# Patient Record
Sex: Male | Born: 1955
Health system: Southern US, Community
[De-identification: ages and names within clinical notes are randomized; demographics above are authoritative.]

## PROBLEM LIST (undated history)

## (undated) ENCOUNTER — Inpatient Hospital Stay: Admission: EM | Payer: Self-pay | Source: Home / Self Care

## (undated) DIAGNOSIS — R7303 Prediabetes: Secondary | ICD-10-CM

## (undated) DIAGNOSIS — F411 Generalized anxiety disorder: Secondary | ICD-10-CM

## (undated) DIAGNOSIS — K219 Gastro-esophageal reflux disease without esophagitis: Secondary | ICD-10-CM

## (undated) DIAGNOSIS — E785 Hyperlipidemia, unspecified: Secondary | ICD-10-CM

## (undated) DIAGNOSIS — M1990 Unspecified osteoarthritis, unspecified site: Secondary | ICD-10-CM

## (undated) DIAGNOSIS — K5792 Diverticulitis of intestine, part unspecified, without perforation or abscess without bleeding: Secondary | ICD-10-CM

## (undated) DIAGNOSIS — F419 Anxiety disorder, unspecified: Secondary | ICD-10-CM

## (undated) DIAGNOSIS — E119 Type 2 diabetes mellitus without complications: Secondary | ICD-10-CM

## (undated) DIAGNOSIS — E1140 Type 2 diabetes mellitus with diabetic neuropathy, unspecified: Secondary | ICD-10-CM

## (undated) DIAGNOSIS — I10 Essential (primary) hypertension: Secondary | ICD-10-CM

## (undated) DIAGNOSIS — D126 Benign neoplasm of colon, unspecified: Secondary | ICD-10-CM

## (undated) DIAGNOSIS — C8590 Non-Hodgkin lymphoma, unspecified, unspecified site: Secondary | ICD-10-CM

## (undated) DIAGNOSIS — G709 Myoneural disorder, unspecified: Secondary | ICD-10-CM

## (undated) DIAGNOSIS — IMO0002 Reserved for concepts with insufficient information to code with codable children: Secondary | ICD-10-CM

## (undated) HISTORY — DX: Prediabetes: R73.03

## (undated) HISTORY — DX: Essential (primary) hypertension: I10

## (undated) HISTORY — DX: Type 2 diabetes mellitus without complications: E11.9

## (undated) HISTORY — PX: COLON SURGERY: SHX602

## (undated) HISTORY — DX: Gastro-esophageal reflux disease without esophagitis: K21.9

## (undated) HISTORY — DX: Benign neoplasm of colon, unspecified: D12.6

## (undated) HISTORY — PX: NASAL SINUS SURGERY: SHX719

## (undated) HISTORY — PX: COSMETIC SURGERY: SHX468

## (undated) HISTORY — PX: OTHER SURGICAL HISTORY: SHX169

## (undated) HISTORY — DX: Type 2 diabetes mellitus with diabetic neuropathy, unspecified: E11.40

## (undated) HISTORY — DX: Generalized anxiety disorder: F41.1

## (undated) HISTORY — DX: Hyperlipidemia, unspecified: E78.5

## (undated) HISTORY — DX: Reserved for concepts with insufficient information to code with codable children: IMO0002

## (undated) HISTORY — DX: Non-Hodgkin lymphoma, unspecified, unspecified site: C85.90

---

## 1999-01-06 HISTORY — PX: ACHILLES TENDON REPAIR: SUR1153

## 2000-01-06 DIAGNOSIS — Z9889 Other specified postprocedural states: Secondary | ICD-10-CM | POA: Insufficient documentation

## 2010-01-05 HISTORY — PX: HEMORROIDECTOMY: SUR656

## 2010-01-05 HISTORY — PX: COLONOSCOPY: SHX174

## 2010-06-19 DIAGNOSIS — Z8719 Personal history of other diseases of the digestive system: Secondary | ICD-10-CM | POA: Insufficient documentation

## 2012-01-06 HISTORY — PX: ROTATOR CUFF REPAIR: SHX139

## 2012-01-06 HISTORY — PX: BICEPS TENDON REPAIR: SHX566

## 2013-07-21 ENCOUNTER — Telehealth: Payer: Self-pay | Admitting: Internal Medicine

## 2013-07-21 NOTE — Telephone Encounter (Signed)
I should be able to accomodate this as long as he does not have a very complicated medical history

## 2013-07-21 NOTE — Telephone Encounter (Signed)
Pt called to make a new patient appointment. He needs a biometric screening form completed for work and due to his travel schedule, he would like to have new pt and his biometeric form completed on same day.  Best number to call pt is [phone].

## 2013-07-24 NOTE — Telephone Encounter (Signed)
Pt is aware that bringing the form is okay

## 2013-08-02 ENCOUNTER — Ambulatory Visit: Payer: Self-pay | Admitting: Internal Medicine

## 2014-06-06 ENCOUNTER — Encounter: Payer: Self-pay | Admitting: Family Medicine

## 2014-06-06 ENCOUNTER — Ambulatory Visit (INDEPENDENT_AMBULATORY_CARE_PROVIDER_SITE_OTHER): Payer: BLUE CROSS/BLUE SHIELD | Admitting: Family Medicine

## 2014-06-06 ENCOUNTER — Ambulatory Visit (INDEPENDENT_AMBULATORY_CARE_PROVIDER_SITE_OTHER)
Admission: RE | Admit: 2014-06-06 | Discharge: 2014-06-06 | Disposition: A | Discharge status: Home / Self Care | Payer: BLUE CROSS/BLUE SHIELD | Source: Ambulatory Visit | Attending: Family Medicine | Admitting: Family Medicine

## 2014-06-06 VITALS — BP 110/60 | HR 69 | Temp 97.6°F | Ht 69.5 in | Wt 217.5 lb

## 2014-06-06 DIAGNOSIS — M1990 Unspecified osteoarthritis, unspecified site: Secondary | ICD-10-CM | POA: Diagnosis not present

## 2014-06-06 DIAGNOSIS — R7309 Other abnormal glucose: Secondary | ICD-10-CM | POA: Diagnosis not present

## 2014-06-06 DIAGNOSIS — R7303 Prediabetes: Secondary | ICD-10-CM

## 2014-06-06 DIAGNOSIS — K219 Gastro-esophageal reflux disease without esophagitis: Secondary | ICD-10-CM | POA: Diagnosis not present

## 2014-06-06 DIAGNOSIS — M1611 Unilateral primary osteoarthritis, right hip: Secondary | ICD-10-CM

## 2014-06-06 DIAGNOSIS — M25551 Pain in right hip: Secondary | ICD-10-CM

## 2014-06-06 NOTE — Progress Notes (Signed)
Pre visit review using our clinic review tool, if applicable. No additional management support is needed unless otherwise documented below in the visit note. 

## 2014-06-06 NOTE — Progress Notes (Signed)
Dr. Frederico Hamman T. Arias Weinert, MD, [facility] and Sports Medicine [place], [address] Phone: [phone] Fax: [phone]  06/06/2014  [name]: Charles [name], MRN: [id number], DOB: February 12, 1955, 59 y.o.  Primary Physician:  Dr. Lorelei Pont Chief Complaint: Hip Pain  Subjective:   Charles [name] is Charles [name] who presents with Charles following:  New [name], eval for hip pain:   Very pleasant [name] who presents as Charles new [name] with Charles history significant for Achilles tendon rupture in Charles 2001 that was managed conservatively for approximately 9 weeks in Charles cast, and then when Charles [name] was out of his cast he reruptured his Achilles tendon 2 days after.  Subsequently he required surgery and was compromised for approximately 9 months per his report.  Since then he has had an altered gait on Charles right, but he is able to play golf routinely.  For Charles last 6 or 7 years, he has been having some significant hip pain that very recently has been waking him up at night all of Charles time.  It also limits him in what he wants to do.  He is still able to play golf, but he is not able to rotate on his hip, and after he plays golf his very limited for 2 or 3 days.  He does take some Aleve and other anti-inflammatories and Tylenol to try to give him some pain relief, which helps Charles little bit for some short amount of time.  This also bothers him when he is getting in and out of his car and when he is trying to work.  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   2001, R achilles rupture - in cast for 9 weeks.  Still plays Charles lot of golf.   Played 18 holes of golf and woke up at 2 AM last night. Took Charles couple of ibuprofen.   Ongoing 6-7 years.   [name] Active Problem List   Diagnosis Date Noted  . Arthritis of right hip 06/07/2014  . GERD (gastroesophageal reflux disease)   . Borderline diabetes     Past Medical History    Diagnosis Date  . GERD (gastroesophageal reflux disease)   . Borderline diabetes     Past Surgical History  Procedure Laterality Date  . Achilles tendon repair  2001  . Rotator cuff repair Left 2014  . Biceps tendon repair Left 2014  . Hemorroidectomy  2012    History   Social History  . Marital Status: Married    Spouse Name: N/Charles  . Number of Children: N/Charles  . Years of Education: N/Charles   Occupational History  . insurance adj    Social History Main Topics  . Smoking status: Former Research scientist (life sciences)  . Smokeless tobacco: Former Systems developer  . Alcohol Use: No     Comment: occ  . Drug Use: No  . Sexual Activity:    Partners: Female   Other Topics Concern  . Not on file   Social History Narrative   Married, Arts administrator baseball player   Avid golfer    Family History  Problem Relation Age of Onset  . Alcohol abuse Father   . Heart attack Father   . Hyperlipidemia Father   . Stroke Father   . Hypertension Father   . Breast cancer Mother   . Hypertension Mother     No Known Allergies  Medication list  reviewed and updated in full in [facility].   Past Medical History, Surgical History, Social History, Family History, Problem List, Medications, and Allergies have been reviewed and updated if relevant.  GEN: No fevers, chills. Nontoxic. Primarily MSK c/o today. MSK: Detailed in Charles HPI GI: tolerating PO intake without difficulty Neuro: No numbness, parasthesias, or tingling associated. Otherwise, Charles pertinent positives and negatives are listed above and in Charles HPI, otherwise Charles full review of systems has been reviewed and is negative unless noted positive.   Objective:   BP 110/60 mmHg  Pulse 69  Temp(Src) 97.6 F (36.4 C) (Oral)  Ht 5' 9.5" (1.765 m)  Wt 217 lb 8 oz (98.657 kg)  BMI 31.67 kg/m2   GEN: WDWN, NAD, Non-toxic, Charles & O x 3 HEENT: Atraumatic, Normocephalic. Neck supple. No masses, No LAD. Ears and Nose: No external deformity. CV: RRR,  No M/G/R. No JVD. No thrill. No extra heart sounds. PULM: CTA B, no wheezes, crackles, rhonchi. No retractions. No resp. distress. No accessory muscle use. EXTR: No c/c/e NEURO sensation intact - notably anatalgic gait PSYCH: Normally interactive. Conversant. Not depressed or anxious appearing.  Calm demeanor.    HIP EXAM: SIDE: R ROM: Abduction, Flexion, Internal and External range of motion: notable loss of motion.  Charles [name] is minimally able to abduct his hip.  He is only able to flex his hip approximately 70.  In 70 of flexion, external range of motion is less limited compared to internal range of motion, but there is still an approximate 30-40% loss of motion.  Internal range of motion is relatively limited and causes pain in Charles true groin. GTB: NT SLR: NEG Knees: No effusion FABER: cannot complete REVERSE FABER: cannot complete Piriformis: NT at direct palpation Str: flexion: 4/5 abduction: 4+/5 adduction: 4/5 Strength testing non-tender  Compared to Charles left leg, there is very significant quadriceps wasting on Charles right compared to Charles left.  Radiology: Dg Hip Unilat With Pelvis 2-3 Views Right  06/06/2014   CLINICAL DATA:  Right hip pain chronically with recent worsening.  EXAM: RIGHT HIP (WITH PELVIS) 2-3 VIEWS  COMPARISON:  None.  FINDINGS: Exam demonstrates moderate osteoarthritic change of Charles right hip and mild osteoarthritic change of Charles left hip. No acute fracture or dislocation. Multiple pelvic phleboliths are noted.  IMPRESSION: Moderate osteoarthritis of Charles right hip and mild osteoarthritis of Charles left hip.   Electronically Signed   By: Marin Olp M.D.   On: 06/06/2014 13:23    Charles radiological images were independently reviewed by myself in Charles office and results were reviewed with Charles [name]. My independent interpretation of images:  I would characterize this more as moderate to severe osteoarthritis of Charles right hip.  Charles [name] has flattening of Charles femoral  head, and he has significant loss of joint space throughout Charles right side.  There is very significant osteophyte formation with Charles very large superior osteophyte and calcification. Electronically Signed  By: Owens Loffler, MD On: 06/07/2014 9:46 AM   Assessment and Plan:   Arthritis of right hip - Plan: Ambulatory referral to Orthopedic Surgery, DG FLUORO GUIDE NDL PLCD/BX/INJ/LOC  Right hip pain - Plan: DG HIP UNILAT WITH PELVIS 2-3 VIEWS RIGHT, Ambulatory referral to Orthopedic Surgery, DG FLUORO GUIDE NDL PLCD/BX/INJ/LOC  Gastroesophageal reflux disease, esophagitis presence not specified  Borderline diabetes  Tried to have Charles very frank conversation with Charles [name] about his hip.  Clinically and radiographically, he is significantly limited.  I  would term his hip radiographs is more moderate to severe in nature myself.  Clinically, he has significantly limited motion, and he is limited in his ability to work and do his job, and he is having pain on Charles daily basis.  He also has been having difficulty sleeping now off and on for quite some time.  He is failed most traditional conservative management.  We are going to try Charles fluoroscopy guided hip injection to see if this brings him some relief.  Given Charles [name]'s impact on his quality of life with his hip, I think it is reasonable to consider total hip arthroplasty, and we are going to set him up with Charles follow-up appointment with Dr. Alvan Dame for his opinion regarding his hip.  I discussed arthritis and its progressive nature in general with Charles [name] and tried to answer all of his questions.  I am happy to see this [name] is Charles primary care provider additionally, as he is asked.  Follow-up: prn for CPX in Charles fall  New Prescriptions   No medications on file   Orders Placed This Encounter  Procedures  . DG HIP UNILAT WITH PELVIS 2-3 VIEWS RIGHT  . DG FLUORO GUIDE NDL PLCD/BX/INJ/LOC  . Ambulatory referral to Orthopedic Surgery     Signed,  Frederico Hamman T. Vihan Santagata, MD   [name]'s Medications  New Prescriptions   No medications on file  Previous Medications   ASPIRIN 81 MG TABLET    Take 81 mg by mouth daily.   CETIRIZINE (ZYRTEC) 10 MG TABLET    Take 10 mg by mouth daily.   DIPHENHYDRAMINE HCL (BENADRYL PO)    Take 1 capsule by mouth as needed.   IBUPROFEN (MOTRIN PO)    Take by mouth as needed.   KRILL OIL PO    Take 1 capsule by mouth daily.   LORAZEPAM (ATIVAN) 0.5 MG TABLET    Take 0.5 mg by mouth every 4 (four) hours as needed for anxiety.   MULTIPLE VITAMINS-MINERALS (MULTIVITAMIN WITH MINERALS) TABLET    Take 1 tablet by mouth daily.   PSEUDOEPHEDRINE HCL (SUDAFED PO)    Take 1 tablet by mouth as needed.   RANITIDINE HCL (ZANTAC PO)    Take 1 tablet by mouth daily.  Modified Medications   No medications on file  Discontinued Medications   No medications on file

## 2014-06-06 NOTE — Patient Instructions (Signed)

## 2014-06-07 ENCOUNTER — Encounter: Payer: Self-pay | Admitting: Family Medicine

## 2014-06-07 DIAGNOSIS — M1611 Unilateral primary osteoarthritis, right hip: Secondary | ICD-10-CM | POA: Insufficient documentation

## 2014-06-07 DIAGNOSIS — K219 Gastro-esophageal reflux disease without esophagitis: Secondary | ICD-10-CM | POA: Insufficient documentation

## 2014-06-07 DIAGNOSIS — E119 Type 2 diabetes mellitus without complications: Secondary | ICD-10-CM | POA: Insufficient documentation

## 2014-07-04 ENCOUNTER — Encounter: Payer: Self-pay | Admitting: Family Medicine

## 2014-08-31 ENCOUNTER — Telehealth: Payer: Self-pay | Admitting: Family Medicine

## 2014-08-31 NOTE — Telephone Encounter (Signed)
error 

## 2014-09-04 ENCOUNTER — Ambulatory Visit: Payer: Self-pay | Admitting: Internal Medicine

## 2014-10-01 ENCOUNTER — Ambulatory Visit: Payer: BLUE CROSS/BLUE SHIELD | Admitting: Family Medicine

## 2014-10-18 ENCOUNTER — Ambulatory Visit (INDEPENDENT_AMBULATORY_CARE_PROVIDER_SITE_OTHER): Payer: BLUE CROSS/BLUE SHIELD | Admitting: Family Medicine

## 2014-10-18 ENCOUNTER — Encounter (INDEPENDENT_AMBULATORY_CARE_PROVIDER_SITE_OTHER): Payer: Self-pay

## 2014-10-18 ENCOUNTER — Encounter: Payer: Self-pay | Admitting: Family Medicine

## 2014-10-18 VITALS — BP 118/78 | HR 66 | Temp 97.6°F | Ht 69.5 in | Wt 221.0 lb

## 2014-10-18 DIAGNOSIS — Z79899 Other long term (current) drug therapy: Secondary | ICD-10-CM | POA: Diagnosis not present

## 2014-10-18 DIAGNOSIS — Z125 Encounter for screening for malignant neoplasm of prostate: Secondary | ICD-10-CM

## 2014-10-18 DIAGNOSIS — M1990 Unspecified osteoarthritis, unspecified site: Secondary | ICD-10-CM | POA: Diagnosis not present

## 2014-10-18 DIAGNOSIS — R7303 Prediabetes: Secondary | ICD-10-CM

## 2014-10-18 DIAGNOSIS — F411 Generalized anxiety disorder: Secondary | ICD-10-CM

## 2014-10-18 DIAGNOSIS — Z23 Encounter for immunization: Secondary | ICD-10-CM | POA: Diagnosis not present

## 2014-10-18 DIAGNOSIS — E785 Hyperlipidemia, unspecified: Secondary | ICD-10-CM | POA: Diagnosis not present

## 2014-10-18 DIAGNOSIS — M1611 Unilateral primary osteoarthritis, right hip: Secondary | ICD-10-CM

## 2014-10-18 LAB — CBC WITH DIFFERENTIAL/PLATELET
Basophils Absolute: 0 10*3/uL (ref 0.0–0.1)
Basophils Relative: 0.6 % (ref 0.0–3.0)
EOS ABS: 0.6 10*3/uL (ref 0.0–0.7)
Eosinophils Relative: 7.6 % — ABNORMAL HIGH (ref 0.0–5.0)
HCT: 48.7 % (ref 39.0–52.0)
Hemoglobin: 16.4 g/dL (ref 13.0–17.0)
LYMPHS ABS: 1.8 10*3/uL (ref 0.7–4.0)
Lymphocytes Relative: 23.4 % (ref 12.0–46.0)
MCHC: 33.7 g/dL (ref 30.0–36.0)
MCV: 92.2 fl (ref 78.0–100.0)
Monocytes Absolute: 0.6 10*3/uL (ref 0.1–1.0)
Monocytes Relative: 7.9 % (ref 3.0–12.0)
NEUTROS ABS: 4.6 10*3/uL (ref 1.4–7.7)
NEUTROS PCT: 60.5 % (ref 43.0–77.0)
PLATELETS: 229 10*3/uL (ref 150.0–400.0)
RBC: 5.29 Mil/uL (ref 4.22–5.81)
RDW: 13.4 % (ref 11.5–15.5)
WBC: 7.6 10*3/uL (ref 4.0–10.5)

## 2014-10-18 LAB — BASIC METABOLIC PANEL
BUN: 16 mg/dL (ref 6–23)
CALCIUM: 9.7 mg/dL (ref 8.4–10.5)
CO2: 31 meq/L (ref 19–32)
CREATININE: 0.91 mg/dL (ref 0.40–1.50)
Chloride: 101 mEq/L (ref 96–112)
GFR: 90.1 mL/min (ref >60.00)
GLUCOSE: 87 mg/dL (ref 70–99)
Potassium: 4.3 mEq/L (ref 3.5–5.1)
Sodium: 140 mEq/L (ref 135–145)

## 2014-10-18 LAB — HEPATIC FUNCTION PANEL
ALBUMIN: 4.4 g/dL (ref 3.5–5.2)
ALT: 32 U/L (ref 0–53)
AST: 20 U/L (ref 0–37)
Alkaline Phosphatase: 85 U/L (ref 39–117)
BILIRUBIN DIRECT: 0.1 mg/dL (ref 0.0–0.3)
Total Bilirubin: 0.6 mg/dL (ref 0.2–1.2)
Total Protein: 7.7 g/dL (ref 6.0–8.3)

## 2014-10-18 LAB — LIPID PANEL
CHOL/HDL RATIO: 6
Cholesterol: 243 mg/dL — ABNORMAL HIGH (ref 0–200)
HDL: 41.5 mg/dL (ref >39.00)
NONHDL: 201
TRIGLYCERIDES: 216 mg/dL — AB (ref 0.0–149.0)
VLDL: 43.2 mg/dL — ABNORMAL HIGH (ref 0.0–40.0)

## 2014-10-18 LAB — HEMOGLOBIN A1C: Hgb A1c MFr Bld: 6.1 % (ref 4.6–6.5)

## 2014-10-18 LAB — LDL CHOLESTEROL, DIRECT: Direct LDL: 154 mg/dL

## 2014-10-18 LAB — PSA: PSA: 1.41 ng/mL (ref 0.10–4.00)

## 2014-10-18 MED ORDER — LORAZEPAM 0.5 MG PO TABS: 0.5000 mg | ORAL_TABLET | Freq: Three times a day (TID) | ORAL | Status: DC | PRN

## 2014-10-18 NOTE — Progress Notes (Signed)
Dr. Frederico Hamman T. Geoffery Aultman, MD, [facility] Primary [facility] and Sports Medicine [place], [address] Phone: [phone] Fax: [phone]  10/18/2014  Patient: Charles [name], MRN: [id number], DOB: 09-Jul-1955, 59 y.o.  Primary Physician:  Owens Loffler, MD  Chief Complaint: Establish Care  Subjective:   Charles [name] is a 59 y.o. very pleasant male patient who presents with the following:  Needs labs. And to establish care   Upcoming R THR.  12/10/2014. Upcoming joint replacement.   Work is driving nuts, and Diplomatic Services operational officer.   Ativan refill.   Body mass index is 32.18 kg/(m^2).   DDD of the neck   Wants to get back to light workouts  ? Borderline DM  Diabetes Mellitus: Tolerating Medications: none Compliance with diet: fair Exercise: minimal / intermittent Avg blood sugars at home: not checking Foot problems: none Hypoglycemia: none No nausea, vomitting, blurred vision, polyuria.  Lab Results  Component Value Date   HGBA1C 6.1 10/18/2014   Lab Results  Component Value Date   CREATININE 0.91 10/18/2014    Wt Readings from Last 3 Encounters:  10/18/14 221 lb (100.245 kg)  06/06/14 217 lb 8 oz (98.657 kg)    Body mass index is 32.18 kg/(m^2).    Past Medical History, Surgical History, Social History, Family History, Problem List, Medications, and Allergies have been reviewed and updated if relevant.  Patient Active Problem List   Diagnosis Date Noted  . Arthritis of right hip 06/07/2014  . GERD (gastroesophageal reflux disease)   . Borderline diabetes     Past Medical History  Diagnosis Date  . GERD (gastroesophageal reflux disease)   . Borderline diabetes     Past Surgical History  Procedure Laterality Date  . Achilles tendon repair  2001  . Rotator cuff repair Left 2014  . Biceps tendon repair Left 2014  . Hemorroidectomy  2012    Social History   Social History  . Marital Status: Married    Spouse Name:  N/A  . Number of Children: N/A  . Years of Education: N/A   Occupational History  . insurance adj    Social History Main Topics  . Smoking status: Former Research scientist (life sciences)  . Smokeless tobacco: Former Systems developer  . Alcohol Use: No     Comment: occ  . Drug Use: No  . Sexual Activity:    Partners: Female   Other Topics Concern  . Not on file   Social History Narrative   Married, Arts administrator baseball player   Avid golfer    Family History  Problem Relation Age of Onset  . Alcohol abuse Father   . Heart attack Father   . Hyperlipidemia Father   . Stroke Father   . Hypertension Father   . Breast cancer Mother   . Hypertension Mother     No Known Allergies  Medication list reviewed and updated in full in [place].   GEN: No acute illnesses, no fevers, chills. GI: No n/v/d, eating normally Pulm: No SOB Interactive and getting along well at home.  Otherwise, ROS is as per the HPI.  Objective:   BP 118/78 mmHg  Pulse 66  Temp(Src) 97.6 F (36.4 C) (Oral)  Ht 5' 9.5" (1.765 m)  Wt 221 lb (100.245 kg)  BMI 32.18 kg/m2  GEN: WDWN, NAD, Non-toxic, A & O x 3 HEENT: Atraumatic, Normocephalic. Neck supple. No masses, No LAD. Ears and Nose: No external deformity. CV: RRR,  No M/G/R. No JVD. No thrill. No extra heart sounds. PULM: CTA B, no wheezes, crackles, rhonchi. No retractions. No resp. distress. No accessory muscle use. EXTR: No c/c/e NEURO Normal gait.  PSYCH: Normally interactive. Conversant. Not depressed or anxious appearing.  Calm demeanor.   Laboratory and Imaging Data: Results for orders placed or performed in visit on 88/91/69  Basic metabolic panel  Result Value Ref Range   Sodium 140 135 - 145 mEq/L   Potassium 4.3 3.5 - 5.1 mEq/L   Chloride 101 96 - 112 mEq/L   CO2 31 19 - 32 mEq/L   Glucose, Bld 87 70 - 99 mg/dL   BUN 16 6 - 23 mg/dL   Creatinine, Ser 0.91 0.40 - 1.50 mg/dL   Calcium 9.7 8.4 - 10.5 mg/dL   GFR 90.10 >60.00 mL/min    CBC with Differential/Platelet  Result Value Ref Range   WBC 7.6 4.0 - 10.5 K/uL   RBC 5.29 4.22 - 5.81 Mil/uL   Hemoglobin 16.4 13.0 - 17.0 g/dL   HCT 48.7 39.0 - 52.0 %   MCV 92.2 78.0 - 100.0 fl   MCHC 33.7 30.0 - 36.0 g/dL   RDW 13.4 11.5 - 15.5 %   Platelets 229.0 150.0 - 400.0 K/uL   Neutrophils Relative % 60.5 43.0 - 77.0 %   Lymphocytes Relative 23.4 12.0 - 46.0 %   Monocytes Relative 7.9 3.0 - 12.0 %   Eosinophils Relative 7.6 (H) 0.0 - 5.0 %   Basophils Relative 0.6 0.0 - 3.0 %   Neutro Abs 4.6 1.4 - 7.7 K/uL   Lymphs Abs 1.8 0.7 - 4.0 K/uL   Monocytes Absolute 0.6 0.1 - 1.0 K/uL   Eosinophils Absolute 0.6 0.0 - 0.7 K/uL   Basophils Absolute 0.0 0.0 - 0.1 K/uL  Hepatic function panel  Result Value Ref Range   Total Bilirubin 0.6 0.2 - 1.2 mg/dL   Bilirubin, Direct 0.1 0.0 - 0.3 mg/dL   Alkaline Phosphatase 85 39 - 117 U/L   AST 20 0 - 37 U/L   ALT 32 0 - 53 U/L   Total Protein 7.7 6.0 - 8.3 g/dL   Albumin 4.4 3.5 - 5.2 g/dL  Hemoglobin A1c  Result Value Ref Range   Hgb A1c MFr Bld 6.1 4.6 - 6.5 %  Lipid panel  Result Value Ref Range   Cholesterol 243 (H) 0 - 200 mg/dL   Triglycerides 216.0 (H) 0.0 - 149.0 mg/dL   HDL 41.50 >39.00 mg/dL   VLDL 43.2 (H) 0.0 - 40.0 mg/dL   Total CHOL/HDL Ratio 6    NonHDL 201.00   PSA  Result Value Ref Range   PSA 1.41 0.10 - 4.00 ng/mL  LDL cholesterol, direct  Result Value Ref Range   Direct LDL 154.0 mg/dL     Assessment and Plan:   Borderline diabetes - Plan: Hemoglobin A1c  Arthritis of right hip  Screening PSA (prostate specific antigen) - Plan: PSA  Hyperlipidemia - Plan: Lipid panel  Encounter for long-term (current) use of medications - Plan: Basic metabolic panel, CBC with Differential/Platelet, Hepatic function panel  Need for prophylactic vaccination and inoculation against influenza - Plan: Flu Vaccine QUAD 36+ mos IM  Generalized anxiety disorder  Overall, doing ok, chol is up Still borderline  DM  Follow-up: No Follow-up on file.  New Prescriptions   No medications on file   Modified Medications   Modified Medication Previous Medication   LORAZEPAM (ATIVAN) 0.5 MG TABLET LORazepam (ATIVAN)  0.5 MG tablet      Take 1 tablet (0.5 mg total) by mouth every 8 (eight) hours as needed for anxiety.    Take 0.5 mg by mouth every 4 (four) hours as needed for anxiety.   Orders Placed This Encounter  Procedures  . Flu Vaccine QUAD 36+ mos IM  . Basic metabolic panel  . CBC with Differential/Platelet  . Hepatic function panel  . Hemoglobin A1c  . Lipid panel  . PSA  . LDL cholesterol, direct    Signed,  Gianny Killman T. Dunbar Buras, MD   Patient's Medications  New Prescriptions   No medications on file  Previous Medications   ASPIRIN 81 MG TABLET    Take 81 mg by mouth daily.   CETIRIZINE (ZYRTEC) 10 MG TABLET    Take 10 mg by mouth daily.   DIPHENHYDRAMINE HCL (BENADRYL PO)    Take 1 capsule by mouth as needed.   IBUPROFEN (MOTRIN PO)    Take by mouth as needed.   KRILL OIL PO    Take 1 capsule by mouth daily.   MULTIPLE VITAMINS-MINERALS (MULTIVITAMIN WITH MINERALS) TABLET    Take 1 tablet by mouth daily.   PSEUDOEPHEDRINE HCL (SUDAFED PO)    Take 1 tablet by mouth as needed.   RANITIDINE HCL (ZANTAC PO)    Take 1 tablet by mouth daily.  Modified Medications   Modified Medication Previous Medication   LORAZEPAM (ATIVAN) 0.5 MG TABLET LORazepam (ATIVAN) 0.5 MG tablet      Take 1 tablet (0.5 mg total) by mouth every 8 (eight) hours as needed for anxiety.    Take 0.5 mg by mouth every 4 (four) hours as needed for anxiety.  Discontinued Medications   No medications on file

## 2014-10-18 NOTE — Progress Notes (Signed)
Pre visit review using our clinic review tool, if applicable. No additional management support is needed unless otherwise documented below in the visit note. 

## 2014-10-21 ENCOUNTER — Encounter: Payer: Self-pay | Admitting: Family Medicine

## 2014-10-21 DIAGNOSIS — E785 Hyperlipidemia, unspecified: Secondary | ICD-10-CM | POA: Insufficient documentation

## 2014-10-21 DIAGNOSIS — F411 Generalized anxiety disorder: Secondary | ICD-10-CM

## 2014-10-21 DIAGNOSIS — E782 Mixed hyperlipidemia: Secondary | ICD-10-CM | POA: Insufficient documentation

## 2014-10-21 HISTORY — DX: Generalized anxiety disorder: F41.1

## 2014-10-23 ENCOUNTER — Encounter: Payer: Self-pay | Admitting: *Deleted

## 2014-11-23 ENCOUNTER — Other Ambulatory Visit (HOSPITAL_COMMUNITY): Payer: Self-pay | Admitting: Orthopaedic Surgery

## 2014-12-03 ENCOUNTER — Encounter (HOSPITAL_COMMUNITY)
Admission: RE | Admit: 2014-12-03 | Discharge: 2014-12-03 | Disposition: A | Discharge status: Home / Self Care | Payer: BLUE CROSS/BLUE SHIELD | Source: Ambulatory Visit | Attending: Orthopaedic Surgery | Admitting: Orthopaedic Surgery

## 2014-12-03 ENCOUNTER — Encounter (HOSPITAL_COMMUNITY): Payer: Self-pay

## 2014-12-03 DIAGNOSIS — M1611 Unilateral primary osteoarthritis, right hip: Secondary | ICD-10-CM | POA: Insufficient documentation

## 2014-12-03 DIAGNOSIS — Z01818 Encounter for other preprocedural examination: Secondary | ICD-10-CM | POA: Diagnosis not present

## 2014-12-03 HISTORY — DX: Anxiety disorder, unspecified: F41.9

## 2014-12-03 HISTORY — DX: Diverticulitis of intestine, part unspecified, without perforation or abscess without bleeding: K57.92

## 2014-12-03 LAB — BASIC METABOLIC PANEL
Anion gap: 9 (ref 5–15)
BUN: 12 mg/dL (ref 6–20)
CALCIUM: 9.9 mg/dL (ref 8.9–10.3)
CO2: 26 mmol/L (ref 22–32)
Chloride: 104 mmol/L (ref 101–111)
Creatinine, Ser: 0.89 mg/dL (ref 0.61–1.24)
GFR calc Af Amer: >60 mL/min (ref >60)
GLUCOSE: 119 mg/dL — AB (ref 65–99)
Potassium: 4.2 mmol/L (ref 3.5–5.1)
Sodium: 139 mmol/L (ref 135–145)

## 2014-12-03 LAB — PROTIME-INR
INR: 1.1 (ref 0.00–1.49)
PROTHROMBIN TIME: 14.4 s (ref 11.6–15.2)

## 2014-12-03 LAB — CBC
HCT: 45.2 % (ref 39.0–52.0)
Hemoglobin: 15.5 g/dL (ref 13.0–17.0)
MCH: 31.2 pg (ref 26.0–34.0)
MCHC: 34.3 g/dL (ref 30.0–36.0)
MCV: 90.9 fL (ref 78.0–100.0)
Platelets: 208 10*3/uL (ref 150–400)
RBC: 4.97 MIL/uL (ref 4.22–5.81)
RDW: 13.1 % (ref 11.5–15.5)
WBC: 6.7 10*3/uL (ref 4.0–10.5)

## 2014-12-03 LAB — APTT: aPTT: 35 seconds (ref 24–37)

## 2014-12-03 LAB — ABO/RH: ABO/RH(D): O POS

## 2014-12-03 LAB — SURGICAL PCR SCREEN
MRSA, PCR: NEGATIVE
STAPHYLOCOCCUS AUREUS: POSITIVE — AB

## 2014-12-03 NOTE — Patient Instructions (Addendum)
Charles [name]  12/03/2014   Your procedure is scheduled on: Friday December 07, 2014   Report to [facility] Main  Entrance take [place]  elevators to 3rd floor to  [place] at 5:30 AM.  Call this number if you have problems the morning of surgery [phone]   Remember: ONLY 1 PERSON MAY GO WITH YOU TO SHORT STAY TO GET  READY MORNING OF [place].  Do not eat food or drink liquids :After Midnight.     Take these medicines the morning of surgery with A SIP OF WATER: Ceterizine (Zyrtec) if needed; Lorazepam (Ativan) if needed; Ranitidine (Zantac)  DO NOT TAKE ANY DIABETIC MEDICATIONS DAY OF YOUR SURGERY                               You may not have any metal on your body including hair pins and              piercings  Do not wear jewelry, lotions, powders or colonges, deodorant                          Men may shave face and neck.   Do not bring valuables to the hospital. [place].  Contacts, dentures or bridgework may not be worn into surgery.  Leave suitcase in the car. After surgery it may be brought to your room.            [facility] - Preparing for Surgery Before surgery, you can play an important role.  Because skin is not sterile, your skin needs to be as free of germs as possible.  You can reduce the number of germs on your skin by washing with CHG (chlorahexidine gluconate) soap before surgery.  CHG is an antiseptic cleaner which kills germs and bonds with the skin to continue killing germs even after washing. Please DO NOT use if you have an allergy to CHG or antibacterial soaps.  If your skin becomes reddened/irritated stop using the CHG and inform your nurse when you arrive at Short Stay. Do not shave (including legs and underarms) for at least 48 hours prior to the first CHG shower.  You may shave your face/neck. Please follow these instructions carefully:  1.  Shower with CHG Soap  the night before surgery and the  morning of Surgery.  2.  If you choose to wash your hair, wash your hair first as usual with your  normal  shampoo.  3.  After you shampoo, rinse your hair and body thoroughly to remove the  shampoo.                           4.  Use CHG as you would any other liquid soap.  You can apply chg directly  to the skin and wash                       Gently with a scrungie or clean washcloth.  5.  Apply the CHG Soap to your body ONLY FROM THE NECK DOWN.   Do not use on face/ open  Wound or open sores. Avoid contact with eyes, ears mouth and genitals (private parts).                       Wash face,  Genitals (private parts) with your normal soap.             6.  Wash thoroughly, paying special attention to the area where your surgery  will be performed.  7.  Thoroughly rinse your body with warm water from the neck down.  8.  DO NOT shower/wash with your normal soap after using and rinsing off  the CHG Soap.                9.  Pat yourself dry with a clean towel.            10.  Wear clean pajamas.            11.  Place clean sheets on your bed the night of your first shower and do not  sleep with pets. Day of Surgery : Do not apply any lotions/deodorants the morning of surgery.  Please wear clean clothes to the hospital/surgery center.  FAILURE TO FOLLOW THESE INSTRUCTIONS MAY RESULT IN THE CANCELLATION OF YOUR SURGERY PATIENT SIGNATURE_________________________________  NURSE SIGNATURE__________________________________  ________________________________________________________________________

## 2014-12-04 NOTE — Progress Notes (Signed)
Surgical screening results/epic per PAT visit 12/03/2014 positive for STAPH. Results sent to Dr Kathrynn Speed. Pt will need Nasal Betadine per surgical date due to limited time frame prior to surgery for pt to complete Mupuriocin Ointment treatment.

## 2014-12-06 NOTE — Anesthesia Preprocedure Evaluation (Addendum)
Anesthesia Evaluation  Patient identified by MRN, date of birth, ID band Patient awake    Reviewed: Allergy & Precautions, NPO status , Patient's Chart, lab work & pertinent test results  Airway Mallampati: II  TM Distance: >3 FB Neck ROM: Full    Dental  (+) Dental Advisory Given   Pulmonary former smoker,    breath sounds clear to auscultation       Cardiovascular negative cardio ROS   Rhythm:Regular Rate:Normal     Neuro/Psych Anxiety negative neurological ROS     GI/Hepatic Neg liver ROS, GERD  ,  Endo/Other  negative endocrine ROS  Renal/GU negative Renal ROS     Musculoskeletal  (+) Arthritis ,   Abdominal   Peds  Hematology negative hematology ROS (+)   Anesthesia Other Findings   Reproductive/Obstetrics                           Lab Results  Component Value Date   WBC 6.7 12/03/2014   HGB 15.5 12/03/2014   HCT 45.2 12/03/2014   MCV 90.9 12/03/2014   PLT 208 12/03/2014   Lab Results  Component Value Date   CREATININE 0.89 12/03/2014   BUN 12 12/03/2014   NA 139 12/03/2014   K 4.2 12/03/2014   CL 104 12/03/2014   CO2 26 12/03/2014   Lab Results  Component Value Date   INR 1.10 12/03/2014    Anesthesia Physical Anesthesia Plan  ASA: II  Anesthesia Plan: General   Post-op Pain Management:    Induction: Intravenous  Airway Management Planned: Oral ETT  Additional Equipment:   Intra-op Plan:   Post-operative Plan: Extubation in OR  Informed Consent: I have reviewed the patients History and Physical, chart, labs and discussed the procedure including the risks, benefits and alternatives for the proposed anesthesia with the patient or authorized representative who has indicated his/her understanding and acceptance.   Dental advisory given  Plan Discussed with: CRNA  Anesthesia Plan Comments: (Pt declines SAB. Plan for GETA. Routine monitors.)        Anesthesia Quick Evaluation

## 2014-12-07 ENCOUNTER — Inpatient Hospital Stay (HOSPITAL_COMMUNITY): Payer: BLUE CROSS/BLUE SHIELD

## 2014-12-07 ENCOUNTER — Encounter (HOSPITAL_COMMUNITY)
Admission: RE | Disposition: A | Discharge status: Home Health | Payer: Self-pay | Source: Ambulatory Visit | Attending: Orthopaedic Surgery

## 2014-12-07 ENCOUNTER — Inpatient Hospital Stay (HOSPITAL_COMMUNITY): Payer: BLUE CROSS/BLUE SHIELD | Admitting: Anesthesiology

## 2014-12-07 ENCOUNTER — Encounter (HOSPITAL_COMMUNITY): Payer: Self-pay | Admitting: *Deleted

## 2014-12-07 ENCOUNTER — Inpatient Hospital Stay (HOSPITAL_COMMUNITY)
Admission: RE | Admit: 2014-12-07 | Discharge: 2014-12-08 | DRG: 470 | Disposition: A | Discharge status: Home Health | Payer: BLUE CROSS/BLUE SHIELD | Source: Ambulatory Visit | Attending: Orthopaedic Surgery | Admitting: Orthopaedic Surgery

## 2014-12-07 DIAGNOSIS — M1611 Unilateral primary osteoarthritis, right hip: Secondary | ICD-10-CM | POA: Diagnosis present

## 2014-12-07 DIAGNOSIS — Z01812 Encounter for preprocedural laboratory examination: Secondary | ICD-10-CM

## 2014-12-07 DIAGNOSIS — Z87891 Personal history of nicotine dependence: Secondary | ICD-10-CM | POA: Diagnosis not present

## 2014-12-07 DIAGNOSIS — F411 Generalized anxiety disorder: Secondary | ICD-10-CM | POA: Diagnosis present

## 2014-12-07 DIAGNOSIS — Z79899 Other long term (current) drug therapy: Secondary | ICD-10-CM

## 2014-12-07 DIAGNOSIS — Z7982 Long term (current) use of aspirin: Secondary | ICD-10-CM

## 2014-12-07 DIAGNOSIS — Z419 Encounter for procedure for purposes other than remedying health state, unspecified: Secondary | ICD-10-CM

## 2014-12-07 DIAGNOSIS — K219 Gastro-esophageal reflux disease without esophagitis: Secondary | ICD-10-CM | POA: Diagnosis present

## 2014-12-07 DIAGNOSIS — E785 Hyperlipidemia, unspecified: Secondary | ICD-10-CM | POA: Diagnosis present

## 2014-12-07 DIAGNOSIS — M25551 Pain in right hip: Secondary | ICD-10-CM | POA: Diagnosis present

## 2014-12-07 DIAGNOSIS — Z96641 Presence of right artificial hip joint: Secondary | ICD-10-CM

## 2014-12-07 HISTORY — PX: TOTAL HIP ARTHROPLASTY: SHX124

## 2014-12-07 LAB — GLUCOSE, CAPILLARY: GLUCOSE-CAPILLARY: 101 mg/dL — AB (ref 65–99)

## 2014-12-07 LAB — TYPE AND SCREEN
ABO/RH(D): O POS
Antibody Screen: NEGATIVE

## 2014-12-07 SURGERY — ARTHROPLASTY, HIP, TOTAL, ANTERIOR APPROACH
Anesthesia: General | Site: Hip | Laterality: Right

## 2014-12-07 MED ORDER — HYDROMORPHONE HCL 1 MG/ML IJ SOLN
0.2500 mg | INTRAMUSCULAR | Status: DC | PRN
  Administered 2014-12-07 (×2): 0.5 mg via INTRAVENOUS

## 2014-12-07 MED ORDER — ROCURONIUM BROMIDE 100 MG/10ML IV SOLN
INTRAVENOUS | Status: AC
  Filled 2014-12-07: qty 1

## 2014-12-07 MED ORDER — OXYCODONE HCL 5 MG PO TABS
5.0000 mg | ORAL_TABLET | ORAL | Status: DC | PRN
  Administered 2014-12-07 (×3): 10 mg via ORAL
  Filled 2014-12-07 (×3): qty 2

## 2014-12-07 MED ORDER — HYDROMORPHONE HCL 1 MG/ML IJ SOLN
INTRAMUSCULAR | Status: AC
  Filled 2014-12-07: qty 1

## 2014-12-07 MED ORDER — LORAZEPAM 0.5 MG PO TABS
0.5000 mg | ORAL_TABLET | Freq: Three times a day (TID) | ORAL | Status: DC | PRN
  Administered 2014-12-07: 0.5 mg via ORAL
  Filled 2014-12-07: qty 1

## 2014-12-07 MED ORDER — LACTATED RINGERS IV SOLN
INTRAVENOUS | Status: DC | PRN
  Administered 2014-12-07 (×2): via INTRAVENOUS

## 2014-12-07 MED ORDER — BUPIVACAINE HCL (PF) 0.5 % IJ SOLN
INTRAMUSCULAR | Status: AC
  Filled 2014-12-07: qty 30

## 2014-12-07 MED ORDER — CEFAZOLIN SODIUM-DEXTROSE 2-3 GM-% IV SOLR
INTRAVENOUS | Status: AC
  Filled 2014-12-07: qty 50

## 2014-12-07 MED ORDER — DEXAMETHASONE SODIUM PHOSPHATE 10 MG/ML IJ SOLN
INTRAMUSCULAR | Status: AC
  Filled 2014-12-07: qty 1

## 2014-12-07 MED ORDER — PROMETHAZINE HCL 25 MG/ML IJ SOLN: 6.2500 mg | INTRAMUSCULAR | Status: DC | PRN

## 2014-12-07 MED ORDER — FENTANYL CITRATE (PF) 100 MCG/2ML IJ SOLN
INTRAMUSCULAR | Status: DC | PRN
  Administered 2014-12-07 (×2): 100 ug via INTRAVENOUS
  Administered 2014-12-07: 50 ug via INTRAVENOUS
  Administered 2014-12-07: 100 ug via INTRAVENOUS

## 2014-12-07 MED ORDER — METHOCARBAMOL 500 MG PO TABS
500.0000 mg | ORAL_TABLET | Freq: Four times a day (QID) | ORAL | Status: DC | PRN
  Administered 2014-12-07: 500 mg via ORAL
  Filled 2014-12-07: qty 1

## 2014-12-07 MED ORDER — LORATADINE 10 MG PO TABS
10.0000 mg | ORAL_TABLET | Freq: Every day | ORAL | Status: DC
  Administered 2014-12-07 – 2014-12-08 (×2): 10 mg via ORAL
  Filled 2014-12-07 (×2): qty 1

## 2014-12-07 MED ORDER — ONDANSETRON HCL 4 MG PO TABS: 4.0000 mg | ORAL_TABLET | Freq: Four times a day (QID) | ORAL | Status: DC | PRN

## 2014-12-07 MED ORDER — KETOROLAC TROMETHAMINE 30 MG/ML IJ SOLN
30.0000 mg | Freq: Once | INTRAMUSCULAR | Status: AC
  Administered 2014-12-07: 30 mg via INTRAVENOUS

## 2014-12-07 MED ORDER — DIPHENHYDRAMINE HCL 12.5 MG/5ML PO ELIX: 12.5000 mg | ORAL_SOLUTION | ORAL | Status: DC | PRN

## 2014-12-07 MED ORDER — SODIUM CHLORIDE 0.9 % IR SOLN
Status: DC | PRN
  Administered 2014-12-07: 1000 mL

## 2014-12-07 MED ORDER — DEXAMETHASONE SODIUM PHOSPHATE 10 MG/ML IJ SOLN
INTRAMUSCULAR | Status: DC | PRN
Start: 2014-12-07 — End: 2014-12-07
  Administered 2014-12-07: 10 mg via INTRAVENOUS

## 2014-12-07 MED ORDER — FENTANYL CITRATE (PF) 250 MCG/5ML IJ SOLN
INTRAMUSCULAR | Status: AC
  Filled 2014-12-07: qty 5

## 2014-12-07 MED ORDER — PROPOFOL 10 MG/ML IV BOLUS
INTRAVENOUS | Status: DC | PRN
  Administered 2014-12-07: 220 mg via INTRAVENOUS

## 2014-12-07 MED ORDER — ONDANSETRON HCL 4 MG/2ML IJ SOLN
INTRAMUSCULAR | Status: AC
  Filled 2014-12-07: qty 2

## 2014-12-07 MED ORDER — KETOROLAC TROMETHAMINE 15 MG/ML IJ SOLN
15.0000 mg | Freq: Four times a day (QID) | INTRAMUSCULAR | Status: AC
  Administered 2014-12-07 – 2014-12-08 (×4): 15 mg via INTRAVENOUS
  Filled 2014-12-07 (×4): qty 1

## 2014-12-07 MED ORDER — ONDANSETRON HCL 4 MG/2ML IJ SOLN
INTRAMUSCULAR | Status: DC | PRN
  Administered 2014-12-07: 4 mg via INTRAVENOUS

## 2014-12-07 MED ORDER — TRANEXAMIC ACID 1000 MG/10ML IV SOLN
1000.0000 mg | INTRAVENOUS | Status: AC
  Administered 2014-12-07: 1000 mg via INTRAVENOUS
  Filled 2014-12-07: qty 10

## 2014-12-07 MED ORDER — ROCURONIUM BROMIDE 100 MG/10ML IV SOLN
INTRAVENOUS | Status: DC | PRN
  Administered 2014-12-07: 40 mg via INTRAVENOUS
  Administered 2014-12-07 (×2): 10 mg via INTRAVENOUS

## 2014-12-07 MED ORDER — MENTHOL 3 MG MT LOZG: 1.0000 | LOZENGE | OROMUCOSAL | Status: DC | PRN

## 2014-12-07 MED ORDER — KETOROLAC TROMETHAMINE 30 MG/ML IJ SOLN
INTRAMUSCULAR | Status: AC
  Filled 2014-12-07: qty 1

## 2014-12-07 MED ORDER — LIDOCAINE HCL (CARDIAC) 20 MG/ML IV SOLN
INTRAVENOUS | Status: DC | PRN
  Administered 2014-12-07: 100 mg via INTRAVENOUS

## 2014-12-07 MED ORDER — MIDAZOLAM HCL 5 MG/5ML IJ SOLN
INTRAMUSCULAR | Status: DC | PRN
  Administered 2014-12-07: 2 mg via INTRAVENOUS

## 2014-12-07 MED ORDER — METHOCARBAMOL 1000 MG/10ML IJ SOLN
500.0000 mg | Freq: Four times a day (QID) | INTRAVENOUS | Status: DC | PRN
  Administered 2014-12-07: 500 mg via INTRAVENOUS
  Filled 2014-12-07 (×2): qty 5

## 2014-12-07 MED ORDER — SUCCINYLCHOLINE CHLORIDE 20 MG/ML IJ SOLN
INTRAMUSCULAR | Status: DC | PRN
  Administered 2014-12-07: 100 mg via INTRAVENOUS

## 2014-12-07 MED ORDER — FAMOTIDINE 20 MG PO TABS
20.0000 mg | ORAL_TABLET | Freq: Every day | ORAL | Status: DC
  Administered 2014-12-08: 20 mg via ORAL
  Filled 2014-12-07: qty 1

## 2014-12-07 MED ORDER — SODIUM CHLORIDE 0.9 % IV SOLN
INTRAVENOUS | Status: DC
  Administered 2014-12-07: 75 mL/h via INTRAVENOUS

## 2014-12-07 MED ORDER — METOCLOPRAMIDE HCL 5 MG/ML IJ SOLN: 5.0000 mg | Freq: Three times a day (TID) | INTRAMUSCULAR | Status: DC | PRN

## 2014-12-07 MED ORDER — HYDROMORPHONE HCL 1 MG/ML IJ SOLN
0.2500 mg | INTRAMUSCULAR | Status: DC | PRN
  Administered 2014-12-07 (×5): 0.5 mg via INTRAVENOUS

## 2014-12-07 MED ORDER — LIDOCAINE HCL (CARDIAC) 20 MG/ML IV SOLN
INTRAVENOUS | Status: AC
  Filled 2014-12-07: qty 5

## 2014-12-07 MED ORDER — ACETAMINOPHEN 650 MG RE SUPP: 650.0000 mg | Freq: Four times a day (QID) | RECTAL | Status: DC | PRN

## 2014-12-07 MED ORDER — METOCLOPRAMIDE HCL 10 MG PO TABS: 5.0000 mg | ORAL_TABLET | Freq: Three times a day (TID) | ORAL | Status: DC | PRN

## 2014-12-07 MED ORDER — PROPOFOL 10 MG/ML IV BOLUS
INTRAVENOUS | Status: AC
  Filled 2014-12-07: qty 40

## 2014-12-07 MED ORDER — ZOLPIDEM TARTRATE 5 MG PO TABS: 5.0000 mg | ORAL_TABLET | Freq: Every evening | ORAL | Status: DC | PRN

## 2014-12-07 MED ORDER — SUGAMMADEX SODIUM 200 MG/2ML IV SOLN
INTRAVENOUS | Status: AC
  Filled 2014-12-07: qty 2

## 2014-12-07 MED ORDER — MIDAZOLAM HCL 2 MG/2ML IJ SOLN
INTRAMUSCULAR | Status: AC
  Filled 2014-12-07: qty 2

## 2014-12-07 MED ORDER — CEFAZOLIN SODIUM-DEXTROSE 2-3 GM-% IV SOLR
2.0000 g | INTRAVENOUS | Status: AC
  Administered 2014-12-07: 2 g via INTRAVENOUS

## 2014-12-07 MED ORDER — ALUM & MAG HYDROXIDE-SIMETH 200-200-20 MG/5ML PO SUSP: 30.0000 mL | ORAL | Status: DC | PRN

## 2014-12-07 MED ORDER — SUGAMMADEX SODIUM 200 MG/2ML IV SOLN
INTRAVENOUS | Status: DC | PRN
  Administered 2014-12-07: 200 mg via INTRAVENOUS

## 2014-12-07 MED ORDER — ASPIRIN EC 325 MG PO TBEC
325.0000 mg | DELAYED_RELEASE_TABLET | Freq: Two times a day (BID) | ORAL | Status: DC
  Administered 2014-12-07 – 2014-12-08 (×2): 325 mg via ORAL
  Filled 2014-12-07 (×4): qty 1

## 2014-12-07 MED ORDER — OXYCODONE HCL 5 MG/5ML PO SOLN
5.0000 mg | Freq: Once | ORAL | Status: DC | PRN
  Filled 2014-12-07: qty 5

## 2014-12-07 MED ORDER — KETOROLAC TROMETHAMINE 15 MG/ML IJ SOLN: 15.0000 mg | Freq: Four times a day (QID) | INTRAMUSCULAR | Status: DC

## 2014-12-07 MED ORDER — CEFAZOLIN SODIUM 1-5 GM-% IV SOLN
1.0000 g | Freq: Four times a day (QID) | INTRAVENOUS | Status: AC
Start: 2014-12-07 — End: 2014-12-07
  Administered 2014-12-07 (×2): 1 g via INTRAVENOUS
  Filled 2014-12-07 (×2): qty 50

## 2014-12-07 MED ORDER — OXYCODONE HCL 5 MG PO TABS: 5.0000 mg | ORAL_TABLET | Freq: Once | ORAL | Status: DC | PRN

## 2014-12-07 MED ORDER — PHENOL 1.4 % MT LIQD
1.0000 | OROMUCOSAL | Status: DC | PRN
  Filled 2014-12-07: qty 177

## 2014-12-07 MED ORDER — HYDROMORPHONE HCL 1 MG/ML IJ SOLN: 1.0000 mg | INTRAMUSCULAR | Status: DC | PRN

## 2014-12-07 MED ORDER — FENTANYL CITRATE (PF) 100 MCG/2ML IJ SOLN
INTRAMUSCULAR | Status: AC
  Filled 2014-12-07: qty 2

## 2014-12-07 MED ORDER — ACETAMINOPHEN 325 MG PO TABS: 650.0000 mg | ORAL_TABLET | Freq: Four times a day (QID) | ORAL | Status: DC | PRN

## 2014-12-07 MED ORDER — ONDANSETRON HCL 4 MG/2ML IJ SOLN: 4.0000 mg | Freq: Four times a day (QID) | INTRAMUSCULAR | Status: DC | PRN

## 2014-12-07 MED ORDER — DOCUSATE SODIUM 100 MG PO CAPS
100.0000 mg | ORAL_CAPSULE | Freq: Two times a day (BID) | ORAL | Status: DC
  Administered 2014-12-07 – 2014-12-08 (×2): 100 mg via ORAL

## 2014-12-07 SURGICAL SUPPLY — 33 items
BAG ZIPLOCK 12X15 (MISCELLANEOUS) IMPLANT
BENZOIN TINCTURE PRP APPL 2/3 (GAUZE/BANDAGES/DRESSINGS) ×3 IMPLANT
BLADE SAW SGTL 18X1.27X75 (BLADE) ×2 IMPLANT
BLADE SAW SGTL 18X1.27X75MM (BLADE) ×1
CAPT HIP TOTAL 2 ×3 IMPLANT
CELLS DAT CNTRL 66122 CELL SVR (MISCELLANEOUS) ×1 IMPLANT
CLOSURE WOUND 1/2 X4 (GAUZE/BANDAGES/DRESSINGS)
CLOTH BEACON ORANGE TIMEOUT ST (SAFETY) ×3 IMPLANT
DRAPE STERI IOBAN 125X83 (DRAPES) ×3 IMPLANT
DRAPE U-SHAPE 47X51 STRL (DRAPES) ×6 IMPLANT
DRSG AQUACEL AG ADV 3.5X10 (GAUZE/BANDAGES/DRESSINGS) ×3 IMPLANT
DRSG XEROFORM 1X8 (GAUZE/BANDAGES/DRESSINGS) ×3 IMPLANT
DURAPREP 26ML APPLICATOR (WOUND CARE) ×3 IMPLANT
ELECT REM PT RETURN 9FT ADLT (ELECTROSURGICAL) ×3
ELECTRODE REM PT RTRN 9FT ADLT (ELECTROSURGICAL) ×1 IMPLANT
GAUZE XEROFORM 1X8 LF (GAUZE/BANDAGES/DRESSINGS) IMPLANT
GLOVE BIO SURGEON STRL SZ7.5 (GLOVE) ×3 IMPLANT
GLOVE BIOGEL PI IND STRL 8 (GLOVE) ×2 IMPLANT
GLOVE BIOGEL PI INDICATOR 8 (GLOVE) ×4
GLOVE ECLIPSE 8.0 STRL XLNG CF (GLOVE) ×3 IMPLANT
GOWN STRL REUS W/TWL XL LVL3 (GOWN DISPOSABLE) ×6 IMPLANT
HANDPIECE INTERPULSE COAX TIP (DISPOSABLE) ×2
PACK ANTERIOR HIP CUSTOM (KITS) ×3 IMPLANT
RTRCTR WOUND ALEXIS 18CM MED (MISCELLANEOUS) ×3
SET HNDPC FAN SPRY TIP SCT (DISPOSABLE) ×1 IMPLANT
STAPLER VISISTAT 35W (STAPLE) IMPLANT
STRIP CLOSURE SKIN 1/2X4 (GAUZE/BANDAGES/DRESSINGS) IMPLANT
SUT ETHIBOND NAB CT1 #1 30IN (SUTURE) ×3 IMPLANT
SUT MNCRL AB 4-0 PS2 18 (SUTURE) IMPLANT
SUT VIC AB 0 CT1 36 (SUTURE) ×3 IMPLANT
SUT VIC AB 1 CT1 36 (SUTURE) ×3 IMPLANT
SUT VIC AB 2-0 CT1 27 (SUTURE) ×4
SUT VIC AB 2-0 CT1 TAPERPNT 27 (SUTURE) ×2 IMPLANT

## 2014-12-07 NOTE — Transfer of Care (Signed)
Immediate Anesthesia Transfer of Care Note  Patient: Charles [name]  Procedure(s) Performed: Procedure(s): RIGHT TOTAL HIP ARTHROPLASTY ANTERIOR APPROACH (Right)  Patient Location: PACU  Anesthesia Type:General  Level of Consciousness: sedated  Airway & Oxygen Therapy: Patient Spontanous Breathing and Patient connected to face mask oxygen  Post-op Assessment: Report given to RN and Post -op Vital signs reviewed and stable  Post vital signs: Reviewed and stable  Last Vitals:  Filed Vitals:   12/07/14 0554  BP: 129/84  Pulse: 63  Temp: 36.3 C  Resp: 20    Complications: No apparent anesthesia complications

## 2014-12-07 NOTE — Brief Op Note (Signed)
12/07/2014  8:45 AM  PATIENT:  Charles [name]  59 y.o. male  PRE-OPERATIVE DIAGNOSIS:  Osteoarthritis right hip  POST-OPERATIVE DIAGNOSIS:  Osteoarthritis right hip  PROCEDURE:  Procedure(s): RIGHT TOTAL HIP ARTHROPLASTY ANTERIOR APPROACH (Right)  SURGEON:  Surgeon(s) and Role:    * Mcarthur Rossetti, MD - Primary  PHYSICIAN ASSISTANT: Benita Stabile, PA-C  ANESTHESIA:   general  EBL:  Total I/O In: 1000 [I.V.:1000] Out: 250 [Blood:250]  BLOOD ADMINISTERED:none  DRAINS: none   LOCAL MEDICATIONS USED:  NONE  SPECIMEN:  No Specimen  DISPOSITION OF SPECIMEN:  N/A  COUNTS:  YES  TOURNIQUET:  * No tourniquets in log *  DICTATION: .Other Dictation: Dictation Number [id number]  PLAN OF CARE: Admit to inpatient   PATIENT DISPOSITION:  PACU - hemodynamically stable.   Delay start of Pharmacological VTE agent (>24hrs) due to surgical blood loss or risk of bleeding: no

## 2014-12-07 NOTE — Evaluation (Signed)
Physical Therapy Evaluation Patient Details Name: Charles [name] MRN: [id number] DOB: [birth year] Today's Date: 12/07/2014   History of Present Illness  R THR  Clinical Impression  Pt s/p R THR presents with decreased R LE strength/ROM and post op pain limiting functional mobility.  Pt should progress well to dc home with family assist and HHPT follow up.    Follow Up Recommendations Home health PT    Equipment Recommendations  Rolling walker with 5" wheels    Recommendations for Other Services OT consult     Precautions / Restrictions Precautions Precautions: Fall Restrictions Weight Bearing Restrictions: No Other Position/Activity Restrictions: WBAT      Mobility  Bed Mobility Overal bed mobility: Needs Assistance Bed Mobility: Supine to Sit     Supine to sit: Min assist     General bed mobility comments: cues for sequence and use of L LE to self assist  Transfers Overall transfer level: Needs assistance Equipment used: Rolling walker (2 wheeled) Transfers: Sit to/from Stand Sit to Stand: Min assist;From elevated surface         General transfer comment: cues for LE management and use of UEs to self assist  Ambulation/Gait Ambulation/Gait assistance: Min assist Ambulation Distance (Feet): 100 Feet Assistive device: Rolling walker (2 wheeled) Gait Pattern/deviations: Step-to pattern;Step-through pattern;Decreased step length - right;Decreased step length - left;Shuffle;Trunk flexed Gait velocity: decr Gait velocity interpretation: Below normal speed for age/gender General Gait Details: cues for sequence, posture and position from [employer]            Wheelchair Mobility    Modified Rankin (Stroke Patients Only)       Balance                                             Pertinent Vitals/Pain Pain Assessment: 0-10 Pain Score: 4  Pain Location: R hip Pain Descriptors / Indicators: Aching;Sore Pain Intervention(s):  Limited activity within patient's tolerance;Monitored during session;Premedicated before session;Ice applied    Home Living Family/patient expects to be discharged to:: Private residence Living Arrangements: Spouse/significant other Available Help at Discharge: Family Type of Home: House Home Access: Stairs to enter Entrance Stairs-Rails: Right;Left;Can reach both Entrance Stairs-Number of Steps: 4 Home Layout: Two level;Able to live on main level with bedroom/bathroom Home Equipment: None      Prior Function Level of Independence: Independent               Hand Dominance        Extremity/Trunk Assessment   Upper Extremity Assessment: Overall WFL for tasks assessed           Lower Extremity Assessment: RLE deficits/detail      Cervical / Trunk Assessment: Normal  Communication   Communication: No difficulties  Cognition Arousal/Alertness: Awake/alert Behavior During Therapy: WFL for tasks assessed/performed Overall Cognitive Status: Within Functional Limits for tasks assessed                      General Comments      Exercises        Assessment/Plan    PT Assessment Patient needs continued PT services  PT Diagnosis Difficulty walking   PT Problem List Decreased strength;Decreased range of motion;Decreased activity tolerance;Decreased mobility;Decreased knowledge of use of DME;Pain  PT Treatment Interventions DME instruction;Gait training;Stair training;Functional mobility training;Therapeutic activities;Therapeutic exercise;Patient/family education   PT Goals (  Current goals can be found in the Care Plan section) Acute Rehab PT Goals Patient Stated Goal: Resume previous lifestyle with decreased pain PT Goal Formulation: With patient Time For Goal Achievement: 12/11/14 Potential to Achieve Goals: Good    Frequency 7X/week   Barriers to discharge        Co-evaluation               End of Session Equipment Utilized During  Treatment: Gait belt Activity Tolerance: Patient tolerated treatment well Patient left: in chair;with call bell/phone within reach;with family/visitor present;with chair alarm set Nurse Communication: Mobility status         Time: [id number] PT Time Calculation (min) (ACUTE ONLY): 32 min   Charges:   PT Evaluation $Initial PT Evaluation Tier I: 1 Procedure PT Treatments $Gait Training: 8-22 mins   PT G Codes:        Jetaun Colbath 26-Dec-2014, 5:05 PM

## 2014-12-07 NOTE — Anesthesia Procedure Notes (Signed)
Procedure Name: Intubation Date/Time: 12/07/2014 7:35 AM Performed by: Lind Covert Pre-anesthesia Checklist: Patient identified, Timeout performed, Emergency Drugs available, Suction available and Patient being monitored Patient Re-evaluated:Patient Re-evaluated prior to inductionOxygen Delivery Method: Circle system utilized Preoxygenation: Pre-oxygenation with 100% oxygen Intubation Type: IV induction Laryngoscope Size: Mac and 4 Grade View: Grade II Tube type: Oral Tube size: 7.5 mm Number of attempts: 1 Airway Equipment and Method: Stylet Placement Confirmation: ETT inserted through vocal cords under direct vision,  breath sounds checked- equal and bilateral and positive ETCO2 Secured at: 23 cm Tube secured with: Tape Dental Injury: Teeth and Oropharynx as per pre-operative assessment

## 2014-12-07 NOTE — Op Note (Signed)
NAMEMarland Kitchen  PAYCEN, CASEBOLT NO.:  [id number]  MEDICAL RECORD NO.:  [id number]  LOCATION:  [id number]                         FACILITY:  [facility]  PHYSICIAN:  Lind Guest. Ninfa Linden, M.D.DATE OF BIRTH:  [birth year]  DATE OF PROCEDURE:  12/07/2014 DATE OF DISCHARGE:                              OPERATIVE REPORT   PREOPERATIVE DIAGNOSES:  Primary osteoarthritis and degenerative joint disease of right hip.  POSTOPERATIVE DIAGNOSES:  Primary osteoarthritis and degenerative joint disease of right hip.  PROCEDURE:  Right total hip arthroplasty through direct anterior approach.  IMPLANTS:  DePuy Sector Gription acetabular component size 54, size 36 +4 neutral polyethylene liner, size 13 Corail femoral component with standard offset, size 36 +5 ceramic hip ball.  SURGEON:  Lind Guest. Ninfa Linden, M.D.  ASSISTANT:  Erskine Emery, PA-C.  ANESTHESIA:  General.  ANTIBIOTICS:  2 g of IV Ancef.  BLOOD LOSS:  250 mL.  COMPLICATIONS:  None.  INDICATIONS:  Mr. Hashagen is a 59 year old gentleman with debilitating arthritis involving his right hip.  He has tried and failed all forms of conservative treatment.  This is now detrimentally affected his activities of daily living, his quality of life and his mobility.  At this point, he wishes to proceed with a total hip arthroplasty through direct anterior approach.  He understands the risk of acute blood loss anemia, nerve and vessel injury, fracture, infection, dislocation, DVT. He understands our goals are to decrease pain and improve mobility and overall improved quality of life.  PROCEDURE DESCRIPTION:  After informed consent was obtained, appropriate right hip was marked.  He was brought to the operating room and general anesthesia was obtained while he was on the stretcher.  Traction boots were placed on both of his feet.  Next, he was placed supine on the Hana fracture table with the perineal post in place and both legs in  inline skeletal traction devices, but no traction applied.  His right operative hip was prepped and draped with DuraPrep and sterile drapes.  Time-out was called and he was identified as correct patient and correct right hip.  I then made an incision inferior and posterior to the anterior superior iliac spine and carried this obliquely down the leg.  We dissected down the tensor fascia lata muscle and tensor fascia was then divided longitudinally, so we could proceed with a direct anterior approach to the hip.  We identified and cauterized the lateral femoral circumflex vessels and then identified the hip capsule placing the Cobra retractors around the lateral femoral neck and the medial femoral neck up underneath the rectus femoris.  We then opened up the hip capsule in an L format finding a moderate effusion.  We placed the Cobra retractors within the hip capsule and then made our femoral neck cut with an oscillating saw just proximal to the lesser trochanter and completed this with an osteotome.  I placed a corkscrew guide in the femoral head and removed the femoral head in is entirety and found it to be devoid of cartilage.  We then placed a bent Hohmann over the medial acetabular rim and removed remnants of the acetabular labrum.  I then began reaming under direct visualization from  a size 42 reamer up to a size 54 with all reamers under direct visualization and the last reamer also under direct fluoroscopy, so we could obtain our depth of reaming, our inclination and anteversion.  Once we were pleased with this, we placed the real DePuy Sector Gription acetabular component size 54 and a 36+ 4 neutral polyethylene liner for that size acetabular component. Attention was then turned to the femur.  With the leg externally rotated to 100 degrees, extended and adducted, I placed a Mueller retractor medially and a Hohmann retractor behind the greater trochanter.  We released the lateral  joint capsule and used a box cutting osteotome to enter the femoral canal and a rongeur to lateralize.  We then began broaching from a size 8 broach up to a size 13.  With the size 13 and placed a trial standard offset neck and a 36+ 1.5 hip ball, we brought the leg back over and up with traction and internal rotation, reducing the pelvis.  We were pleased with the range of motion and stability of the hip, but I felt like we could go just a little bit longer.  We then dislocated the hip and removed the trial components.  I placed the real Corail femoral component from DePuy size 13 with standard offset and the real 36+ 5 ceramic hip ball.  We reduced this in the acetabulum and again it was stable.  We were pleased with leg length, offset, mobility, and range of motion.  We then irrigated the soft tissue with normal saline solution using pulsatile lavage.  We closed the joint capsule with interrupted #1 Ethibond suture followed by running #1 Vicryl in the tensor fascia, 0 Vicryl in the deep tissue, 2-0 Vicryl in the subcutaneous tissue, 4-0 Monocryl subcuticular stitch and Steri-Strips on the skin.  An Aquacel dressing was applied.  He was taken off the Hana table, awakened, extubated and taken to the recovery room in stable condition.  All final counts were correct.  There were no complications noted.     Lind Guest. Ninfa Linden, M.D.     CYB/MEDQ  D:  12/07/2014  T:  12/07/2014  Job:  [place]:[id number]

## 2014-12-07 NOTE — Anesthesia Postprocedure Evaluation (Signed)
Anesthesia Post Note  Patient: RENNICK AWADALLAH  Procedure(s) Performed: Procedure(s) (LRB): RIGHT TOTAL HIP ARTHROPLASTY ANTERIOR APPROACH (Right)  Patient location during evaluation: PACU Anesthesia Type: General Level of consciousness: awake and alert Pain management: satisfactory to patient Vital Signs Assessment: post-procedure vital signs reviewed and stable Respiratory status: spontaneous breathing Cardiovascular status: blood pressure returned to baseline Postop Assessment: no signs of nausea or vomiting Anesthetic complications: no    Last Vitals:  Filed Vitals:   12/07/14 1030 12/07/14 1047  BP: 125/76 127/81  Pulse: 77 79  Temp:  36.5 C  Resp: 15 16    Last Pain:  Filed Vitals:   12/07/14 1129  PainSc: [place], Vada E

## 2014-12-07 NOTE — Progress Notes (Signed)
Utilization review completed.  

## 2014-12-07 NOTE — H&P (Signed)
TOTAL HIP ADMISSION H&P  Patient is admitted for right total hip arthroplasty.  Subjective:  Chief Complaint: right hip pain  HPI: Charles [name], 59 y.o. male, has a history of pain and functional disability in the right hip(s) due to arthritis and patient has failed non-surgical conservative treatments for greater than 12 weeks to include NSAID's and/or analgesics, flexibility and strengthening excercises, weight reduction as appropriate and activity modification.  Onset of symptoms was gradual starting 4 years ago with gradually worsening course since that time.The patient noted no past surgery on the right hip(s).  Patient currently rates pain in the right hip at 10 out of 10 with activity. Patient has night pain, worsening of pain with activity and weight bearing, pain that interfers with activities of daily living and pain with passive range of motion. Patient has evidence of subchondral sclerosis, periarticular osteophytes and joint space narrowing by imaging studies. This condition presents safety issues increasing the risk of falls.  There is no current active infection.  Patient Active Problem List   Diagnosis Date Noted  . Osteoarthritis of right hip 12/07/2014  . Hyperlipidemia 10/21/2014  . Generalized anxiety disorder 10/21/2014  . Arthritis of right hip 06/07/2014  . GERD (gastroesophageal reflux disease)   . Borderline diabetes    Past Medical History  Diagnosis Date  . GERD (gastroesophageal reflux disease)   . Borderline diabetes   . Generalized anxiety disorder 10/21/2014  . Anxiety   . Diverticulitis     Past Surgical History  Procedure Laterality Date  . Achilles tendon repair  2001  . Rotator cuff repair Left 2014  . Biceps tendon repair Left 2014  . Hemorroidectomy  2012  . Colonscopy       Prescriptions prior to admission  Medication Sig Dispense Refill Last Dose  . aspirin 81 MG tablet Take 81 mg by mouth daily.   Taking  . cetirizine (ZYRTEC) 10 MG  tablet Take 10 mg by mouth daily.   11/30/2014  . diphenhydrAMINE (BENADRYL) 25 mg capsule Take 25 mg by mouth at bedtime as needed for allergies.     Marland Kitchen ibuprofen (ADVIL,MOTRIN) 200 MG tablet Take 600 mg by mouth every 8 (eight) hours as needed (pain).     Marland Kitchen KRILL OIL PO Take 1 capsule by mouth daily.   Taking  . LORazepam (ATIVAN) 0.5 MG tablet Take 1 tablet (0.5 mg total) by mouth every 8 (eight) hours as needed for anxiety. 30 tablet 3 12/07/2014 at 0500  . Multiple Vitamins-Minerals (MULTIVITAMIN WITH MINERALS) tablet Take 1 tablet by mouth daily.   Taking  . Pseudoephedrine HCl (SUDAFED PO) Take 1 tablet by mouth daily as needed (allergies).    Past Month at Unknown time  . ranitidine (ZANTAC) 150 MG tablet Take 150 mg by mouth daily.   12/07/2014 at 0500   No Known Allergies  Social History  Substance Use Topics  . Smoking status: Former Smoker -- 0.50 packs/day for 20 years    Types: Cigarettes    Quit date: 01/06/2000  . Smokeless tobacco: Former Systems developer    Types: Chew    Quit date: 01/06/2004  . Alcohol Use: No     Comment: occ/ heavy drinker in past     Family History  Problem Relation Age of Onset  . Alcohol abuse Father   . Heart attack Father   . Hyperlipidemia Father   . Stroke Father   . Hypertension Father   . Breast cancer Mother   . Hypertension  Mother      Review of Systems  Musculoskeletal: Positive for joint pain.  All other systems reviewed and are negative.   Objective:  Physical Exam  Constitutional: He is oriented to person, place, and time. He appears well-developed and well-nourished.  HENT:  Head: Normocephalic and atraumatic.  Eyes: EOM are normal. Pupils are equal, round, and reactive to light.  Neck: Normal range of motion. Neck supple.  Cardiovascular: Normal rate and regular rhythm.   Respiratory: Effort normal and breath sounds normal.  GI: Soft. Bowel sounds are normal.  Musculoskeletal:       Right hip: He exhibits decreased range of  motion, decreased strength, tenderness and bony tenderness.  Neurological: He is alert and oriented to person, place, and time.  Skin: Skin is warm and dry.  Psychiatric: He has a normal mood and affect.    Vital signs in last 24 hours: Temp:  [97.4 F (36.3 C)] 97.4 F (36.3 C) (12/02 0554) Pulse Rate:  [63] 63 (12/02 0554) Resp:  [20] 20 (12/02 0554) BP: (129)/(84) 129/84 mmHg (12/02 0554) SpO2:  [96 %] 96 % (12/02 0554) Weight:  [100.5 kg (221 lb 9 oz)] 100.5 kg (221 lb 9 oz) (12/02 0554)  Labs:   Estimated body mass index is 31.79 kg/(m^2) as calculated from the following:   Height as of this encounter: 5\' 10"  (1.778 m).   Weight as of this encounter: 100.5 kg (221 lb 9 oz).   Imaging Review Plain radiographs demonstrate severe degenerative joint disease of the right hip(s). The bone quality appears to be good for age and reported activity level.  Assessment/Plan:  End stage arthritis, right hip(s)  The patient history, physical examination, clinical judgement of the provider and imaging studies are consistent with end stage degenerative joint disease of the right hip(s) and total hip arthroplasty is deemed medically necessary. The treatment options including medical management, injection therapy, arthroscopy and arthroplasty were discussed at length. The risks and benefits of total hip arthroplasty were presented and reviewed. The risks due to aseptic loosening, infection, stiffness, dislocation/subluxation,  thromboembolic complications and other imponderables were discussed.  The patient acknowledged the explanation, agreed to proceed with the plan and consent was signed. Patient is being admitted for inpatient treatment for surgery, pain control, PT, OT, prophylactic antibiotics, VTE prophylaxis, progressive ambulation and ADL's and discharge planning.The patient is planning to be discharged home with home health services

## 2014-12-08 LAB — BASIC METABOLIC PANEL
ANION GAP: 8 (ref 5–15)
BUN: 17 mg/dL (ref 6–20)
CALCIUM: 8.6 mg/dL — AB (ref 8.9–10.3)
CHLORIDE: 105 mmol/L (ref 101–111)
CO2: 24 mmol/L (ref 22–32)
Creatinine, Ser: 1.04 mg/dL (ref 0.61–1.24)
GFR calc Af Amer: >60 mL/min (ref >60)
GFR calc non Af Amer: >60 mL/min (ref >60)
GLUCOSE: 191 mg/dL — AB (ref 65–99)
POTASSIUM: 4 mmol/L (ref 3.5–5.1)
Sodium: 137 mmol/L (ref 135–145)

## 2014-12-08 LAB — CBC
HEMATOCRIT: 35.9 % — AB (ref 39.0–52.0)
Hemoglobin: 11.9 g/dL — ABNORMAL LOW (ref 13.0–17.0)
MCH: 30.7 pg (ref 26.0–34.0)
MCHC: 33.1 g/dL (ref 30.0–36.0)
MCV: 92.5 fL (ref 78.0–100.0)
Platelets: 197 10*3/uL (ref 150–400)
RBC: 3.88 MIL/uL — ABNORMAL LOW (ref 4.22–5.81)
RDW: 13.1 % (ref 11.5–15.5)
WBC: 10.4 10*3/uL (ref 4.0–10.5)

## 2014-12-08 MED ORDER — ASPIRIN 325 MG PO TBEC: 325.0000 mg | DELAYED_RELEASE_TABLET | Freq: Two times a day (BID) | ORAL | Status: DC

## 2014-12-08 MED ORDER — OXYCODONE HCL 5 MG PO TABS: 5.0000 mg | ORAL_TABLET | ORAL | Status: DC | PRN

## 2014-12-08 NOTE — Clinical Social Work Note (Signed)
CSW consulted for SNF.  Per record review case manager has set up pt with in home PT.  [place] signing off.  Should pt require additional CSW needs, please re consult.  Dede Query, LCSW [place] Worker - Weekend Coverage cell #: [phone]

## 2014-12-08 NOTE — Progress Notes (Signed)
Subjective: Patient doing well pain controlled did have hiccups last night didn't sleep well but seems awake this morning   Objective: Vital signs in last 24 hours: Temp:  [97.5 F (36.4 C)-98.8 F (37.1 C)] 98.5 F (36.9 C) (12/03 0606) Pulse Rate:  [70-94] 70 (12/03 0606) Resp:  [16] 16 (12/03 0606) BP: (93-127)/(52-81) 99/52 mmHg (12/03 0606) SpO2:  [96 %-100 %] 97 % (12/03 0606) Weight:  [100.245 kg (221 lb)] 100.245 kg (221 lb) (12/02 1047)  Intake/Output from previous day: 12/02 0701 - 12/03 0700 In: 2170 [P.O.:720; I.V.:1400; IV Piggyback:50] Out: 1225 [Urine:975; Blood:250] Intake/Output this shift: Total I/O In: 240 [P.O.:240] Out: 275 [Urine:275]  Exam:  Dorsiflexion/Plantar flexion intact  Labs:  Recent Labs  12/08/14 0505  HGB 11.9*    Recent Labs  12/08/14 0505  WBC 10.4  RBC 3.88*  HCT 35.9*  PLT 197    Recent Labs  12/08/14 0505  NA 137  K 4.0  CL 105  CO2 24  BUN 17  CREATININE 1.04  GLUCOSE 191*  CALCIUM 8.6*   No results for input(s): LABPT, INR in the last 72 hours.  Assessment/Plan: Patient desires discharge to home today will discontinue muscle relaxer as this is likely the cause of his hiccups patient has yet to do stairs with physical therapy   Charles [name] SCOTT 12/08/2014, 10:46 AM

## 2014-12-08 NOTE — Progress Notes (Signed)
Occupational Therapy Evaluation Patient Details Name: Charles [name] MRN: [id number] DOB: Nov 04, 1955 Today's Date: 12/08/2014    History of Present Illness R THR   Clinical Impression   All OT education completed with patient and wife and patient functioning at min A to supervision with ADLs at this time. No further OT needs and patient to discharge home today.    Follow Up Recommendations  No OT follow up;Supervision - Intermittent    Equipment Recommendations  3 in 1 bedside comode    Recommendations for Other Services       Precautions / Restrictions Precautions Precautions: Fall (Simultaneous filing. User may not have seen previous data.) Restrictions Weight Bearing Restrictions: No (Simultaneous filing. User may not have seen previous data.) Other Position/Activity Restrictions: WBAT (Simultaneous filing. User may not have seen previous data.)      Mobility Bed Mobility Overal bed mobility: Needs Assistance Bed Mobility: Supine to Sit     Supine to sit: Min assist     General bed mobility comments: instructed pt how to use opposite LE to assist R LE  Transfers Overall transfer level: Needs assistance Equipment used: Rolling walker (2 wheeled) Transfers: Sit to/from Stand Sit to Stand: Supervision;Min guard         General transfer comment: 50% VC's on proper tech and hand placement plus safety with turns    Balance                                            ADL Overall ADL's : Needs assistance/impaired Eating/Feeding: Independent   Grooming: Wash/dry hands;Wash/dry face;Oral care;Supervision/safety;Standing   Upper Body Bathing: Set up;Sitting   Lower Body Bathing: Minimal assistance;Sit to/from stand   Upper Body Dressing : Set up;Sitting   Lower Body Dressing: Minimal assistance;Sit to/from stand   Toilet Transfer: Supervision/safety;BSC;Ambulation;RW   Toileting- Water quality scientist and Hygiene:  Supervision/safety;Sit to/from stand   Tub/ Shower Transfer: Walk-in shower;Min guard;Ambulation;3 in 1;Rolling walker   Functional mobility during ADLs: Supervision/safety;Rolling walker General ADL Comments: Patient received up in recliner. Agreeable to OT session. Educated patient on toilet and shower transfers and use of 3 in 1 for both. They verbalized understanding. Patient practiced transfers and performed them without difficulty after education. Wife to assist as needed. Patient concerned about opening in 3 in 1 not being big enough; discharge planner attempting to get patient elongated seat 3 in 1. They understand use of it in the shower. LB dressing -- patient requiring min A. Wife will assist as needed. No further OT needs.     Vision     Perception     Praxis      Pertinent Vitals/Pain Pain Assessment: 0-10 Pain Score: 5  Pain Location: R hip Pain Descriptors / Indicators: Aching;Sore;Tightness Pain Intervention(s): Monitored during session;Premedicated before session;Repositioned;Ice applied     Hand Dominance     Extremity/Trunk Assessment Upper Extremity Assessment Upper Extremity Assessment: Overall WFL for tasks assessed   Lower Extremity Assessment Lower Extremity Assessment: Defer to PT evaluation   Cervical / Trunk Assessment Cervical / Trunk Assessment: Normal   Communication Communication Communication: No difficulties   Cognition Arousal/Alertness: Awake/alert Behavior During Therapy: WFL for tasks assessed/performed Overall Cognitive Status: Within Functional Limits for tasks assessed                     General Comments  Exercises       Shoulder Instructions      Home Living Family/patient expects to be discharged to:: Private residence Living Arrangements: Spouse/significant other Available Help at Discharge: Family Type of Home: House Home Access: Stairs to enter [employer] of Steps: 4 Entrance Stairs-Rails:  Right;Left;Can reach both Home Layout: Two level;Able to live on main level with bedroom/bathroom Alternate Level Stairs-Number of Steps: 16 Alternate Level Stairs-Rails: Right Bathroom Shower/Tub: Occupational psychologist: Standard Bathroom Accessibility: Yes How Accessible: Accessible via walker Home Equipment: None          Prior Functioning/Environment Level of Independence: Independent             OT Diagnosis: Acute pain   OT Problem List: Decreased strength;Decreased activity tolerance;Decreased knowledge of use of DME or AE;Pain   OT Treatment/Interventions:      OT Goals(Current goals can be found in the care plan section) Acute Rehab OT Goals Patient Stated Goal: to go home today OT Goal Formulation: All assessment and education complete, DC therapy  OT Frequency:     Barriers to D/C:            Co-evaluation              End of Session Equipment Utilized During Treatment: Rolling walker Nurse Communication: Mobility status  Activity Tolerance: Patient tolerated treatment well Patient left: with family/visitor present;Other (comment) (With PTA to take him to do stair training)   Time: [id number] OT Time Calculation (min): 29 min Charges:  OT General Charges $OT Visit: 1 Procedure OT Evaluation $Initial OT Evaluation Tier I: 1 Procedure OT Treatments $Self Care/Home Management : 8-22 mins G-Codes:    Alaysia Lightle A 01/01/15, 1:36 PM

## 2014-12-08 NOTE — Progress Notes (Signed)
Physical Therapy Treatment Patient Details Name: Charles [name] MRN: [id number] DOB: [birth year] Today's Date: 12/08/2014    History of Present Illness R THR    PT Comments    POD # 1 am session Assisted pt OOB to amb a greater distance in hallway with instruction on safety and proper use of walker.  Will return later to practice a flight when spouse arrives for family education.   Follow Up Recommendations  Home health PT     Equipment Recommendations  Rolling walker with 5" wheels;3in1 (PT)    Recommendations for Other Services       Precautions / Restrictions Precautions Precautions: Fall (Simultaneous filing. User may not have seen previous data.) Restrictions Weight Bearing Restrictions: No (Simultaneous filing. User may not have seen previous data.) Other Position/Activity Restrictions: WBAT (Simultaneous filing. User may not have seen previous data.)    Mobility  Bed Mobility Overal bed mobility: Needs Assistance Bed Mobility: Supine to Sit     Supine to sit: Min assist     General bed mobility comments: instructed pt how to use opposite LE to assist R LE  Transfers Overall transfer level: Needs assistance Equipment used: Rolling walker (2 wheeled) Transfers: Sit to/from Stand Sit to Stand: Supervision;Min guard         General transfer comment: 50% VC's on proper tech and hand placement plus safety with turns  Ambulation/Gait Ambulation/Gait assistance: Supervision;Min guard Ambulation Distance (Feet): 185 Feet Assistive device: Rolling walker (2 wheeled) Gait Pattern/deviations: Step-to pattern;Step-through pattern Gait velocity: decreased   General Gait Details: cues for sequence, posture and position from RW and to "roll walker".   Stairs            Wheelchair Mobility    Modified Rankin (Stroke Patients Only)       Balance                                    Cognition Arousal/Alertness: Awake/alert Behavior  During Therapy: WFL for tasks assessed/performed Overall Cognitive Status: Within Functional Limits for tasks assessed                      Exercises      General Comments        Pertinent Vitals/Pain Pain Assessment: 0-10 Pain Score: 5  Pain Location: R hip Pain Descriptors / Indicators: Aching;Sore;Tightness Pain Intervention(s): Monitored during session;Premedicated before session;Repositioned;Ice applied    Home Living Family/patient expects to be discharged to:: Private residence Living Arrangements: Spouse/significant other Available Help at Discharge: Family Type of Home: House Home Access: Stairs to enter Entrance Stairs-Rails: Right;Left;Can reach both Home Layout: Two level;Able to live on main level with bedroom/bathroom Home Equipment: None      Prior Function Level of Independence: Independent          PT Goals (current goals can now be found in the care plan section) Acute Rehab PT Goals Patient Stated Goal: to go home today Progress towards PT goals: Progressing toward goals    Frequency  7X/week    PT Plan      Co-evaluation             End of Session Equipment Utilized During Treatment: Gait belt Activity Tolerance: Patient tolerated treatment well Patient left: in chair;with call bell/phone within reach;with family/visitor present;with chair alarm set     Time: [id number] PT Time Calculation (min) (ACUTE ONLY): 25 min  Charges:  $Gait Training: 8-22 mins $Therapeutic Activity: 8-22 mins                    G Codes:      Rica Koyanagi  PTA [facility]  Acute  Rehab Pager      [phone]

## 2014-12-08 NOTE — Progress Notes (Signed)
Physical Therapy Treatment Patient Details Name: Charles [name] MRN: [id number] DOB: [birth year] Today's Date: 12/08/2014    History of Present Illness R THR    PT Comments    POD # 1 pm session. Spouse present during session.  Assisted with amb in hallway with RW with instruction on proper gait sequencing and safety with turns.  Also practiced a flight of stairs with spouse using one crutch and one rail.  Hands on instruction/performance.  Pt performed well.  Pt ready for D/C to home.   Follow Up Recommendations  Home health PT     Equipment Recommendations  Rolling walker with 5" wheels;3in1 (PT)    Recommendations for Other Services       Precautions / Restrictions Precautions Precautions: Fall (Simultaneous filing. User may not have seen previous data.) Restrictions Weight Bearing Restrictions: No (Simultaneous filing. User may not have seen previous data.) Other Position/Activity Restrictions: WBAT (Simultaneous filing. User may not have seen previous data.)    Mobility  Bed Mobility Overal bed mobility: Needs Assistance Bed Mobility: Supine to Sit     Supine to sit: Min assist     General bed mobility comments: instructed pt how to use opposite LE to assist R LE  Transfers Overall transfer level: Needs assistance Equipment used: Rolling walker (2 wheeled) Transfers: Sit to/from Stand Sit to Stand: Supervision;Min guard         General transfer comment: 50% VC's on proper tech and hand placement plus safety with turns  Ambulation/Gait Ambulation/Gait assistance: Supervision;Min guard Ambulation Distance (Feet): 225 Feet Assistive device: Rolling walker (2 wheeled) Gait Pattern/deviations: Step-to pattern;Step-through pattern Gait velocity: decreased   General Gait Details: cues for sequence, posture and position from RW and to "roll walker".  also instructed to increase heel strike and knee flexion with equal step length.     Stairs Stairs:  Yes Stairs assistance: Min guard Stair Management: One rail Left;Step to pattern;Forwards;With crutches Number of Stairs: 12 General stair comments: with spouse.  Practiced a flight of stairs using one rail and one crutch with spouse present for education on safe handling and proper tech.   Wheelchair Mobility    Modified Rankin (Stroke Patients Only)       Balance                                    Cognition Arousal/Alertness: Awake/alert Behavior During Therapy: WFL for tasks assessed/performed Overall Cognitive Status: Within Functional Limits for tasks assessed                      Exercises      General Comments        Pertinent Vitals/Pain Pain Assessment: 0-10 Pain Score: 5  Pain Location: R hip Pain Descriptors / Indicators: Aching;Sore;Tightness Pain Intervention(s): Monitored during session;Premedicated before session;Repositioned;Ice applied    Home Living Family/patient expects to be discharged to:: Private residence Living Arrangements: Spouse/significant other Available Help at Discharge: Family Type of Home: House Home Access: Stairs to enter Entrance Stairs-Rails: Right;Left;Can reach both Home Layout: Two level;Able to live on main level with bedroom/bathroom Home Equipment: None      Prior Function Level of Independence: Independent          PT Goals (current goals can now be found in the care plan section) Acute Rehab PT Goals Patient Stated Goal: to go home today Progress towards PT goals: Progressing toward  goals    Frequency  7X/week    PT Plan      Co-evaluation             End of Session Equipment Utilized During Treatment: Gait belt Activity Tolerance: Patient tolerated treatment well Patient left: in chair;with call bell/phone within reach;with family/visitor present;with chair alarm set     Time: 1250-1320 PT Time Calculation (min) (ACUTE ONLY): 30 min  Charges:  $Gait Training: 8-22  mins $Therapeutic Activity: 8-22 mins                    G Codes:      Nathanial Rancher Dec 09, 2014, 1:44 PM

## 2014-12-08 NOTE — Progress Notes (Signed)
Discharged from floor via w/c, belongings & wife with pt. No changes in assessment. Jeydi Klingel  

## 2014-12-08 NOTE — Care Management Note (Signed)
Case Management Note  Patient Details  Name: Charles [name] MRN: [id number] Date of Birth: March 09, 1955  Subjective/Objective:      S/p right total hip arthroplasty   Action/Plan: NCM spoke to pt and offered choice for [facility] [place]. Pt agreeable to [facility] for [facility]. Pt requesting RW and 3n1 for home. Contacted [facility] for DME for scheduled dc home today. Wife at home to assist with his care.   Expected Discharge Date:  12/08/2014             Expected Discharge Plan:  [place]  In-House Referral:     Discharge planning Services  CM Consult  Post Acute Care Choice:  Home Health Choice offered to:  Patient  DME Arranged:  3-N-1, Walker rolling DME Agency:     HH Arranged:  PT HH Agency:  [place]  Status of Service:  Completed, signed off  Medicare Important Message Given:    Date Medicare IM Given:    Medicare IM give by:    Date Additional Medicare IM Given:    Additional Medicare Important Message give by:     If discussed at [place] of Stay Meetings, dates discussed:    Additional Comments:  Erenest Rasher, RN 12/08/2014, 12:32 PM

## 2014-12-08 NOTE — Discharge Summary (Signed)
Patient ID: Charles [name] MRN: [id number] DOB/AGE: 1955/06/25 59 y.o.  Admit date: 12/07/2014 Discharge date: 12/08/2014  Admission Diagnoses:  Principal Problem:   Osteoarthritis of right hip Active Problems:   Status post total replacement of right hip   Discharge Diagnoses:  Same  Past Medical History  Diagnosis Date  . GERD (gastroesophageal reflux disease)   . Borderline diabetes   . Generalized anxiety disorder 10/21/2014  . Anxiety   . Diverticulitis     Surgeries: Procedure(s): RIGHT TOTAL HIP ARTHROPLASTY ANTERIOR APPROACH on 12/07/2014   Consultants:    Discharged Condition: Improved  Hospital Course: Charles [name] is an 59 y.o. male who was admitted 12/07/2014 for operative treatment ofOsteoarthritis of right hip. Patient has severe unremitting pain that affects sleep, daily activities, and work/hobbies. After pre-op clearance the patient was taken to the operating room on 12/07/2014 and underwent  Procedure(s): RIGHT TOTAL HIP ARTHROPLASTY ANTERIOR APPROACH.    Patient was given perioperative antibiotics: Anti-infectives    Start     Dose/Rate Route Frequency Ordered Stop   12/07/14 1400  ceFAZolin (ANCEF) IVPB 1 g/50 mL premix     1 g 100 mL/hr over 30 Minutes Intravenous Every 6 hours 12/07/14 1108 12/07/14 1959   12/07/14 0613  ceFAZolin (ANCEF) IVPB 2 g/50 mL premix     2 g 100 mL/hr over 30 Minutes Intravenous On call to O.R. 12/07/14 [id number] 12/07/14 0736       Patient was given sequential compression devices, early ambulation, and chemoprophylaxis to prevent DVT.  Patient benefited maximally from hospital stay and there were no complications.    Recent vital signs: Patient Vitals for the past 24 hrs:  BP Temp Temp src Pulse Resp SpO2  12/08/14 0606 (!) 99/52 mmHg 98.5 F (36.9 C) Oral 70 16 97 %  12/08/14 0123 115/63 mmHg 98.1 F (36.7 C) Oral 74 16 97 %  12/07/14 2141 (!) 93/55 mmHg 98.7 F (37.1 C) Oral 72 16 96 %  12/07/14 1711 103/65 mmHg  98.8 F (37.1 C) - 82 16 96 %  12/07/14 1350 121/64 mmHg 98.4 F (36.9 C) Oral 94 16 98 %  12/07/14 1249 118/79 mmHg 97.9 F (36.6 C) Axillary 90 16 97 %     Recent laboratory studies:  Recent Labs  12/08/14 0505  WBC 10.4  HGB 11.9*  HCT 35.9*  PLT 197  NA 137  K 4.0  CL 105  CO2 24  BUN 17  CREATININE 1.04  GLUCOSE 191*  CALCIUM 8.6*     Discharge Medications:     Medication List    STOP taking these medications        aspirin 81 MG tablet  Replaced by:  aspirin 325 MG EC tablet     ibuprofen 200 MG tablet  Commonly known as:  ADVIL,MOTRIN     KRILL OIL PO      TAKE these medications        aspirin 325 MG EC tablet  Take 1 tablet (325 mg total) by mouth 2 (two) times daily after a meal.     cetirizine 10 MG tablet  Commonly known as:  ZYRTEC  Take 10 mg by mouth daily.     diphenhydrAMINE 25 mg capsule  Commonly known as:  BENADRYL  Take 25 mg by mouth at bedtime as needed for allergies.     LORazepam 0.5 MG tablet  Commonly known as:  ATIVAN  Take 1 tablet (0.5 mg total) by mouth every 8 (  eight) hours as needed for anxiety.     multivitamin with minerals tablet  Take 1 tablet by mouth daily.     oxyCODONE 5 MG immediate release tablet  Commonly known as:  Oxy IR/ROXICODONE  Take 1-2 tablets (5-10 mg total) by mouth every 3 (three) hours as needed for breakthrough pain.     ranitidine 150 MG tablet  Commonly known as:  ZANTAC  Take 150 mg by mouth daily.     SUDAFED PO  Take 1 tablet by mouth daily as needed (allergies).        Diagnostic Studies: Dg C-arm 1-60 Min-no Report  12/07/2014  CLINICAL DATA: surgery C-ARM 1-60 MINUTES Fluoroscopy was utilized by the requesting physician.  No radiographic interpretation.   Dg Hip Port Unilat With Pelvis 1v Right  12/07/2014  CLINICAL DATA:  Status post right total hip replacement. EXAM: DG HIP (WITH OR WITHOUT PELVIS) 1V PORT RIGHT COMPARISON:  Same day. FINDINGS: Right femoral and acetabular  components appear to be well situated. Expected postoperative changes are seen in the adjacent soft tissues. No fracture or dislocation is noted. IMPRESSION: Right hip prosthesis appears to be well situated. These results will be called to the ordering clinician or representative by the Radiologist Assistant, and communication documented in the PACS or zVision Dashboard. Electronically Signed   By: Marijo Conception, M.D.   On: 12/07/2014 10:58   Dg Hip Operative Unilat With Pelvis Right  12/07/2014  CLINICAL DATA:  Right hip replacement EXAM: OPERATIVE right HIP (WITH PELVIS IF PERFORMED) 3 VIEWS TECHNIQUE: Fluoroscopic spot image(s) were submitted for interpretation post-operatively. COMPARISON:  None. FINDINGS: Three views of the right hip submitted including frontal view of the pelvis. There is right hip prosthesis with anatomic alignment. Calcified pelvic phleboliths are noted. IMPRESSION: Right hip prosthesis with anatomic alignment. Fluoroscopy dose 5.07 mGy. Fluoroscopy time 27 seconds. Please see the operative report. Electronically Signed   By: Lahoma Crocker M.D.   On: 12/07/2014 08:53    Disposition: to home      Discharge Instructions    Call MD / Call 911    Complete by:  As directed   If you experience chest pain or shortness of breath, CALL 911 and be transported to the hospital emergency room.  If you develope a fever above 101 F, pus (white drainage) or increased drainage or redness at the wound, or calf pain, call your surgeon's office.     Constipation Prevention    Complete by:  As directed   Drink plenty of fluids.  Prune juice may be helpful.  You may use a stool softener, such as Colace (over the counter) 100 mg twice a day.  Use MiraLax (over the counter) for constipation as needed.     Diet - low sodium heart healthy    Complete by:  As directed      Discharge patient    Complete by:  As directed      Increase activity slowly as tolerated    Complete by:  As directed             Follow-up Information    Follow up with [facility].   Why:  Home Health Physical Therapy   Contact information:   [address] [place] [address] [phone]       Follow up with Mcarthur Rossetti, MD In 2 weeks.   Specialty:  Orthopedic Surgery   Contact information:   [place]  [place] [address] [id number]        Signed: Mcarthur Rossetti 12/08/2014, 12:35 PM   !

## 2014-12-08 NOTE — Discharge Instructions (Signed)

## 2015-03-26 ENCOUNTER — Other Ambulatory Visit: Payer: Self-pay | Admitting: *Deleted

## 2015-03-26 MED ORDER — OSELTAMIVIR PHOSPHATE 75 MG PO CAPS: 75.0000 mg | ORAL_CAPSULE | Freq: Every day | ORAL | Status: DC

## 2015-03-26 NOTE — Telephone Encounter (Signed)
Patient's wife was in today to see Dr. Diona Browner and tested positive for the flu. Rx for Tamiflu sent into [facility] for Charles [name] as instructed by Dr. Diona Browner.

## 2015-08-07 DIAGNOSIS — H43822 Vitreomacular adhesion, left eye: Secondary | ICD-10-CM | POA: Diagnosis not present

## 2015-11-14 DIAGNOSIS — D485 Neoplasm of uncertain behavior of skin: Secondary | ICD-10-CM | POA: Diagnosis not present

## 2015-11-14 DIAGNOSIS — D0439 Carcinoma in situ of skin of other parts of face: Secondary | ICD-10-CM | POA: Diagnosis not present

## 2015-12-11 ENCOUNTER — Ambulatory Visit (INDEPENDENT_AMBULATORY_CARE_PROVIDER_SITE_OTHER): Payer: BLUE CROSS/BLUE SHIELD | Admitting: Orthopaedic Surgery

## 2015-12-11 ENCOUNTER — Ambulatory Visit (INDEPENDENT_AMBULATORY_CARE_PROVIDER_SITE_OTHER): Payer: Self-pay

## 2015-12-11 DIAGNOSIS — Z96641 Presence of right artificial hip joint: Secondary | ICD-10-CM

## 2015-12-11 NOTE — Progress Notes (Signed)
Charles [name] is a year out from a total hip arthroplasty through direct injury approach to the right hip. He has no complaints at all. He has occasional aches and pains but is back to playing golf and doing other activities. His wife is with him is that he's been doing great as well.  On exam he has excellent range of motion of his right hip with no problems at all. His leg lengths are equal. His knee and exam is normal.  An AP pelvis and lateral of his right hip show well-seated implant with no, getting features.  At this point we'll follow up as needed. I talked about things need to bring about for this hip if there is any issues we can always see him for anything else.

## 2015-12-26 DIAGNOSIS — D0439 Carcinoma in situ of skin of other parts of face: Secondary | ICD-10-CM | POA: Diagnosis not present

## 2016-02-03 DIAGNOSIS — L570 Actinic keratosis: Secondary | ICD-10-CM | POA: Diagnosis not present

## 2016-02-21 ENCOUNTER — Telehealth: Payer: Self-pay | Admitting: Family Medicine

## 2016-02-21 DIAGNOSIS — Z23 Encounter for immunization: Secondary | ICD-10-CM

## 2016-02-21 MED ORDER — TETANUS-DIPHTH-ACELL PERTUSSIS 5-2.5-18.5 LF-MCG/0.5 IM SUSP: 0.5000 mL | Freq: Once | INTRAMUSCULAR | 0 refills | Status: AC

## 2016-02-21 NOTE — Telephone Encounter (Signed)
Spouse wanted rx sent to [facility] [place]

## 2016-02-21 NOTE — Telephone Encounter (Signed)
Past due for CPE.  Will need schedule appointment with Dr. Lorelei Pont to get vaccine here or they can get the Tdap done at the pharmacy if they don't want to schedule appointment.

## 2016-02-21 NOTE — Telephone Encounter (Signed)
Spouse called wanting to know if pt could get whooping cough vaccine.  They are having a new grand baby

## 2016-02-21 NOTE — Telephone Encounter (Signed)
Left message asking spouse to call office °

## 2016-02-21 NOTE — Telephone Encounter (Signed)
Rx sent as requested.

## 2016-05-15 ENCOUNTER — Ambulatory Visit (INDEPENDENT_AMBULATORY_CARE_PROVIDER_SITE_OTHER): Payer: BLUE CROSS/BLUE SHIELD | Admitting: Family Medicine

## 2016-05-15 ENCOUNTER — Encounter: Payer: Self-pay | Admitting: Family Medicine

## 2016-05-15 DIAGNOSIS — R05 Cough: Secondary | ICD-10-CM | POA: Diagnosis not present

## 2016-05-15 DIAGNOSIS — R059 Cough, unspecified: Secondary | ICD-10-CM

## 2016-05-15 MED ORDER — HYDROCODONE-HOMATROPINE 5-1.5 MG/5ML PO SYRP: 5.0000 mL | ORAL_SOLUTION | Freq: Three times a day (TID) | ORAL | 0 refills | Status: DC | PRN

## 2016-05-15 MED ORDER — AMOXICILLIN-POT CLAVULANATE 875-125 MG PO TABS: 1.0000 | ORAL_TABLET | Freq: Two times a day (BID) | ORAL | 0 refills | Status: DC

## 2016-05-15 MED ORDER — BENZONATATE 200 MG PO CAPS: 200.0000 mg | ORAL_CAPSULE | Freq: Three times a day (TID) | ORAL | 0 refills | Status: DC | PRN

## 2016-05-15 NOTE — Patient Instructions (Signed)
Tessalon for the cough, start augmentin.   Hycodan if needed for cough.  Sedation caution.  Take care.  Glad to see you.  Rest and fluids in the meantime.

## 2016-05-15 NOTE — Progress Notes (Signed)
Cough.  Going on for about 8 days.  Started with arm aches initially (this is how his sx usually start with a cold).  Then some ST.  He didn't fell well in general.  Rhinorrhea, cough, sputum, walking at night.  Taking otc cold meds in the meantime.  Pseudophed helps minimally.  No fevers.    Rare use of BZD, I'll defer to PCP.  He has used rarely.  D/w pt.    Meds, vitals, and allergies reviewed.   ROS: Per HPI unless specifically indicated in ROS section   GEN: nad, alert and oriented HEENT: mucous membranes moist, tm w/o erythema, nasal exam w/o erythema, clear discharge noted,  OP with cobblestoning NECK: supple w/o LA CV: rrr.   PULM: ctab, no inc wob EXT: no edema SKIN: no acute rash

## 2016-05-17 DIAGNOSIS — R05 Cough: Secondary | ICD-10-CM | POA: Insufficient documentation

## 2016-05-17 DIAGNOSIS — R059 Cough, unspecified: Secondary | ICD-10-CM | POA: Insufficient documentation

## 2016-05-17 NOTE — Assessment & Plan Note (Signed)
Nontoxic. Given the duration, would treat.Tessalon for the cough, start augmentin given the continued discolored sputum. Hycodan if needed for cough.  Sedation caution.  Rest and fluids in the meantime.  Still okay for outpatient follow-up.  I will defer benzodiazepine refill to PCP.

## 2016-05-20 ENCOUNTER — Other Ambulatory Visit: Payer: Self-pay

## 2016-05-20 MED ORDER — LORAZEPAM 0.5 MG PO TABS: 0.5000 mg | ORAL_TABLET | Freq: Three times a day (TID) | ORAL | 0 refills | Status: DC | PRN

## 2016-05-20 NOTE — Telephone Encounter (Signed)
My understanding is that PCP is out of the office for unknown period of time.  I would call in 1 rx in the meantime to allow for PCP to return and address future refills.   Please call in.  Thanks.

## 2016-05-20 NOTE — Telephone Encounter (Signed)
Lorazepam called into [id number] [place], [place] - [address] Phone: [phone] 

## 2016-05-20 NOTE — Telephone Encounter (Signed)
Charles [name] left v/m requesting refill lorazepam to [facility] [place]. Last refilled # 30 x 3 on 10/18/14. Established care on 10/18/14; last acute visit was 05/15/16. Dr Lorelei Pont and Dr Diona Browner are out of office.Please advise.

## 2016-05-26 ENCOUNTER — Telehealth: Payer: Self-pay | Admitting: Family Medicine

## 2016-05-26 NOTE — Telephone Encounter (Signed)
-----   Message from Tonia Ghent, MD sent at 05/15/2016 12:24 PM EDT ----- Regarding: BZD rx Do you want to fill this?  Used rarely, I'll defer to you.  Saw patient for cough.  Thanks.  Brigitte Pulse

## 2016-05-26 NOTE — Telephone Encounter (Signed)
Reasonable - reviewed notes.   Lorazepam 0.5 mg po TID prn anxiety, #30, 0 ref

## 2016-05-26 NOTE — Telephone Encounter (Signed)
Dr. Damita Dunnings went ahead and approved one refill.  Refilled called into pharmacy on 05/20/2016 at 4:24 pm.

## 2016-05-27 ENCOUNTER — Telehealth: Payer: BLUE CROSS/BLUE SHIELD | Admitting: Family

## 2016-05-27 DIAGNOSIS — J0191 Acute recurrent sinusitis, unspecified: Secondary | ICD-10-CM

## 2016-05-27 MED ORDER — DOXYCYCLINE HYCLATE 100 MG PO TABS: 100.0000 mg | ORAL_TABLET | Freq: Two times a day (BID) | ORAL | 0 refills | Status: DC

## 2016-05-27 NOTE — Progress Notes (Signed)

## 2016-05-29 ENCOUNTER — Encounter: Payer: Self-pay | Admitting: Internal Medicine

## 2016-05-29 ENCOUNTER — Ambulatory Visit (INDEPENDENT_AMBULATORY_CARE_PROVIDER_SITE_OTHER): Payer: BLUE CROSS/BLUE SHIELD | Admitting: Internal Medicine

## 2016-05-29 VITALS — BP 116/72 | HR 77 | Temp 97.9°F | Wt 235.5 lb

## 2016-05-29 DIAGNOSIS — K121 Other forms of stomatitis: Secondary | ICD-10-CM

## 2016-05-29 DIAGNOSIS — B370 Candidal stomatitis: Secondary | ICD-10-CM | POA: Diagnosis not present

## 2016-05-29 MED ORDER — FIRST-DUKES MOUTHWASH MT SUSP: 5.0000 mL | Freq: Two times a day (BID) | OROMUCOSAL | 0 refills | Status: DC

## 2016-05-29 MED ORDER — NYSTATIN 100000 UNIT/ML MT SUSP: 5.0000 mL | Freq: Four times a day (QID) | OROMUCOSAL | 0 refills | Status: DC

## 2016-05-29 NOTE — Patient Instructions (Signed)
Oral Thrush, Adult Oral thrush is an infection in your mouth and throat. It causes white patches on your tongue and in your mouth. Follow these instructions at home: Helping with soreness  To lessen your pain: ? Drink cold liquids, like water and iced tea. ? Eat frozen ice pops or frozen juices. ? Eat foods that are easy to swallow, like gelatin and ice cream. ? Drink from a straw if the patches in your mouth are painful. General instructions   Take or use over-the-counter and prescription medicines only as told by your doctor. Medicine for oral thrush may be something to swallow, or it may be something to put on the infected area.  Eat plain yogurt that has live cultures in it. Read the label to make sure.  If you wear dentures: ? Take out your dentures before you go to bed. ? Brush them well. ? Soak them in a denture cleaner.  Rinse your mouth with warm salt-water many times a day. To make the salt-water mixture, completely dissolve 1/2-1 teaspoon of salt in 1 cup of warm water. Contact a doctor if:  Your problems are getting worse.  Your problems do not get better in less than 7 days with treatment.  Your infection is spreading. This may show as white patches on the skin outside of your mouth.  You are nursing your baby and you have redness and pain in the nipples. This information is not intended to replace advice given to you by your health care provider. Make sure you discuss any questions you have with your health care provider. Document Released: 03/18/2009 Document Revised: 09/16/2015 Document Reviewed: 09/16/2015 Elsevier Interactive Patient Education  2017 Elsevier Inc.  

## 2016-05-29 NOTE — Progress Notes (Signed)
Subjective:    Patient ID: Charles [name], male    DOB: 1955/12/13, 61 y.o.   MRN: [id number]  HPI  Pt presents to the clinic today with c/o sore throat. He reports this started 3 weeks ago. He is having some pain with  swallowing. He reports he has white patches on the back of his throat. He denies runny nose, nasal congestion, or cough. He denies fever or chills but reports he feels very weak. He was seen 5/11 by Dr. Damita Dunnings. Had a cough that he felt was bacterial, treated with 10 day course of Augmentin. He did an E-Visit 5/22, diagnosed with sinusitis and is currently taking a 10 day course of Doxycyline. He has been taking the Doxycycline x 3 days with not relief.  Review of Systems      Past Medical History:  Diagnosis Date  . Anxiety   . Borderline diabetes   . Diverticulitis   . Generalized anxiety disorder 10/21/2014  . GERD (gastroesophageal reflux disease)     Current Outpatient Prescriptions  Medication Sig Dispense Refill  . amoxicillin-clavulanate (AUGMENTIN) 875-125 MG tablet Take 1 tablet by mouth 2 (two) times daily. 20 tablet 0  . aspirin EC 81 MG tablet Take 81 mg by mouth daily.    . benzonatate (TESSALON) 200 MG capsule Take 1 capsule (200 mg total) by mouth 3 (three) times daily as needed for cough. 30 capsule 0  . diphenhydrAMINE (BENADRYL) 25 mg capsule Take 25 mg by mouth at bedtime as needed for allergies.    Marland Kitchen doxycycline (VIBRA-TABS) 100 MG tablet Take 1 tablet (100 mg total) by mouth 2 (two) times daily. 20 tablet 0  . HYDROcodone-homatropine (HYCODAN) 5-1.5 MG/5ML syrup Take 5 mLs by mouth every 8 (eight) hours as needed for cough. 75 mL 0  . LORazepam (ATIVAN) 0.5 MG tablet Take 1 tablet (0.5 mg total) by mouth every 8 (eight) hours as needed for anxiety. 30 tablet 0  . Multiple Vitamins-Minerals (MULTIVITAMIN WITH MINERALS) tablet Take 1 tablet by mouth daily.    . Pseudoephedrine HCl (SUDAFED PO) Take 1 tablet by mouth daily as needed (allergies).       . ranitidine (ZANTAC) 150 MG tablet Take 150 mg by mouth daily.     No current facility-administered medications for this visit.     No Known Allergies  Family History  Problem Relation Age of Onset  . Alcohol abuse Father   . Heart attack Father   . Hyperlipidemia Father   . Stroke Father   . Hypertension Father   . Breast cancer Mother   . Hypertension Mother     Social History   Social History  . Marital status: Married    Spouse name: N/A  . Number of children: N/A  . Years of education: N/A   Occupational History  . insurance adj    Social History Main Topics  . Smoking status: Former Smoker    Packs/day: 0.50    Years: 20.00    Types: Cigarettes    Quit date: 01/06/2000  . Smokeless tobacco: Former Systems developer    Types: Chew    Quit date: 01/06/2004  . Alcohol use No     Comment: occ/ heavy drinker in past   . Drug use: No  . Sexual activity: Yes    Partners: Female   Other Topics Concern  . Not on file   Social History Narrative   Married, Scientist, clinical (histocompatibility and immunogenetics)   Avid  golfer     Constitutional: Denies fever, malaise, fatigue, headache or abrupt weight changes.  HEENT: Pt sore throat. Denies eye pain, eye redness, ear pain, ringing in the ears, wax buildup, runny nose, nasal congestion, bloody nose. Respiratory: Denies difficulty breathing, shortness of breath, cough or sputum production.    No other specific complaints in a complete review of systems (except as listed in HPI above).  Objective:   Physical Exam  BP 116/72   Pulse 77   Temp 97.9 F (36.6 C) (Oral)   Wt 235 lb 8 oz (106.8 kg)   SpO2 97%   BMI 33.79 kg/m  Wt Readings from Last 3 Encounters:  05/29/16 235 lb 8 oz (106.8 kg)  05/15/16 236 lb (107 kg)  12/07/14 221 lb (100.2 kg)    General: Appears his stated age,in NAD. HEENT: Throat/Mouth: Teeth present, mucosa pink and moist, oral thrush noted on tongue. 0.5 cm oral aphthous ulcer noted on left tonsillar  pillar. Neck:  No adenopathy noted. Pulmonary/Chest: Normal effort and positive vesicular breath sounds. No respiratory distress. No wheezes, rales or ronchi noted.    BMET    Component Value Date/Time   NA 137 12/08/2014 0505   K 4.0 12/08/2014 0505   CL 105 12/08/2014 0505   CO2 24 12/08/2014 0505   GLUCOSE 191 (H) 12/08/2014 0505   BUN 17 12/08/2014 0505   CREATININE 1.04 12/08/2014 0505   CALCIUM 8.6 (L) 12/08/2014 0505   GFRNONAA >60 12/08/2014 0505   GFRAA >60 12/08/2014 0505    Lipid Panel     Component Value Date/Time   CHOL 243 (H) 10/18/2014 1252   TRIG 216.0 (H) 10/18/2014 1252   HDL 41.50 10/18/2014 1252   CHOLHDL 6 10/18/2014 1252   VLDL 43.2 (H) 10/18/2014 1252    CBC    Component Value Date/Time   WBC 10.4 12/08/2014 0505   RBC 3.88 (L) 12/08/2014 0505   HGB 11.9 (L) 12/08/2014 0505   HCT 35.9 (L) 12/08/2014 0505   PLT 197 12/08/2014 0505   MCV 92.5 12/08/2014 0505   MCH 30.7 12/08/2014 0505   MCHC 33.1 12/08/2014 0505   RDW 13.1 12/08/2014 0505   LYMPHSABS 1.8 10/18/2014 1252   MONOABS 0.6 10/18/2014 1252   EOSABS 0.6 10/18/2014 1252   BASOSABS 0.0 10/18/2014 1252    Hgb A1C Lab Results  Component Value Date   HGBA1C 6.1 10/18/2014            Assessment & Plan:   Oral Aphthous Ulcer:  eRX for Dukes Magic Mouthwash BID x 5 days  Oral Thrush:  Stop Doxycycline eRx for Nystatin QID x 5 days   RTC as needed or if symptoms persist or worsen Caitlinn Klinker, NP

## 2016-07-05 DIAGNOSIS — Z7689 Persons encountering health services in other specified circumstances: Secondary | ICD-10-CM

## 2016-07-15 ENCOUNTER — Ambulatory Visit (INDEPENDENT_AMBULATORY_CARE_PROVIDER_SITE_OTHER): Payer: BLUE CROSS/BLUE SHIELD | Admitting: Family Medicine

## 2016-07-15 ENCOUNTER — Encounter: Payer: Self-pay | Admitting: Family Medicine

## 2016-07-15 VITALS — BP 140/90 | HR 71 | Temp 98.3°F | Ht 69.5 in | Wt 232.8 lb

## 2016-07-15 DIAGNOSIS — F4320 Adjustment disorder, unspecified: Secondary | ICD-10-CM | POA: Diagnosis not present

## 2016-07-15 DIAGNOSIS — F321 Major depressive disorder, single episode, moderate: Secondary | ICD-10-CM

## 2016-07-15 DIAGNOSIS — F5102 Adjustment insomnia: Secondary | ICD-10-CM

## 2016-07-15 DIAGNOSIS — F411 Generalized anxiety disorder: Secondary | ICD-10-CM | POA: Diagnosis not present

## 2016-07-15 DIAGNOSIS — F4321 Adjustment disorder with depressed mood: Secondary | ICD-10-CM

## 2016-07-15 MED ORDER — TRAZODONE HCL 50 MG PO TABS: 50.0000 mg | ORAL_TABLET | Freq: Every evening | ORAL | 2 refills | Status: DC | PRN

## 2016-07-15 MED ORDER — ESCITALOPRAM OXALATE 10 MG PO TABS: 10.0000 mg | ORAL_TABLET | Freq: Every day | ORAL | 2 refills | Status: DC

## 2016-07-15 NOTE — Patient Instructions (Addendum)
Start the Lexapro (escitalopram) 1/2 tablet a day for 1 week, then increase to 1 tablet daily after 1 week.

## 2016-07-15 NOTE — Progress Notes (Signed)
Dr. Frederico Hamman T. Kristena Wilhelmi, MD, [facility] [place], [address] Phone: [phone] Fax: [phone]  07/15/2016  Patient: Charles [name], MRN: [id number], DOB: Sep 02, 1955, 61 y.o.  Primary Physician:  Owens Loffler, MD   Chief Complaint  Patient presents with  . Stress   Subjective:   Charles [name] is a 61 y.o. very pleasant male patient who presents with the following:  Patient follows up after I have not seen him in approximately 2 years.  He is globally not doing all that well, details per below.  Insurance claims for thirty-four years. Last year began to have more stress. Some stress with parents. Parents health has been doing poorly.  Father dies on 04/07/2022 second. Mom is doing very poorly. Mother has dementia.  He called adult protective services with concerns about his mother. She does not live in state.  Lovey Newcomer is his wife. Her mom is on hospice.   Not sleeping. Eating poorly. Some Crying. He feels stressed all the time.  He reports to me that he has had some complaints at work, which is atypical for him.  He is having trouble meeting his productivity demands per his report.  He has a general sensation of anxiety.  He is not enjoying his normal activities.  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  He denies homicidality.  He has had some occasional fleeting thoughts of self-harm, but he does not have a plan.  He tells me that he does not want to hurt himself.  Past Medical History, Surgical History, Social History, Family History, Problem List, Medications, and Allergies have been reviewed and updated if relevant.  Patient Active Problem List   Diagnosis Date Noted  . Status post total replacement of right hip 12/07/2014  . Hyperlipidemia 10/21/2014  . Generalized anxiety disorder  10/21/2014  . GERD (gastroesophageal reflux disease)   . Borderline diabetes     Past Medical History:  Diagnosis Date  . Anxiety   . Borderline diabetes   . Diverticulitis   . Generalized anxiety disorder 10/21/2014  . GERD (gastroesophageal reflux disease)     Past Surgical History:  Procedure Laterality Date  . ACHILLES TENDON REPAIR  04-07-1999  . BICEPS TENDON REPAIR Left 04-06-2012  . colonscopy     . HEMORROIDECTOMY  Apr 07, 2010  . ROTATOR CUFF REPAIR Left 04-06-2012  . TOTAL HIP ARTHROPLASTY Right 12/07/2014   Procedure: RIGHT TOTAL HIP ARTHROPLASTY ANTERIOR APPROACH;  Surgeon: Mcarthur Rossetti, MD;  Location: [facility];  Service: Orthopedics;  Laterality: Right;    Social History   Social History  . Marital status: Married    Spouse name: N/A  . Number of children: N/A  . Years of education: N/A   Occupational History  . insurance adj    Social History Main Topics  . Smoking status: Former Smoker    Packs/day: 0.50    Years: 20.00    Types: Cigarettes    Quit date: 01/06/2000  . Smokeless tobacco: Former Systems developer    Types: Chew    Quit date: 01/06/2004  . Alcohol use No     Comment: occ/ heavy drinker in past   . Drug use: No  . Sexual activity: Yes    Partners: Female   Other Topics Concern  . Not on file  Social History Narrative   Married, Location manager    Family History  Problem Relation Age of Onset  . Alcohol abuse Father   . Heart attack Father   . Hyperlipidemia Father   . Stroke Father   . Hypertension Father   . Breast cancer Mother   . Hypertension Mother     No Known Allergies  Medication list reviewed and updated in full in [facility].   GEN: No acute illnesses, no fevers, chills. GI: No n/v/d, eating normally Pulm: No SOB Interactive and getting along well at home.  Otherwise, ROS is as per the HPI.  Objective:   BP 140/90   Pulse 71   Temp 98.3 F (36.8 C) (Oral)   Ht 5' 9.5" (1.765  m)   Wt 232 lb 12 oz (105.6 kg)   BMI 33.88 kg/m   GEN: WDWN, NAD, Non-toxic, A & O x 3 HEENT: Atraumatic, Normocephalic. Neck supple. No masses, No LAD. Ears and Nose: No external deformity. CV: RRR, No M/G/R. No JVD. No thrill. No extra heart sounds. PULM: CTA B, no wheezes, crackles, rhonchi. No retractions. No resp. distress. No accessory muscle use. EXTR: No c/c/e NEURO Normal gait.  PSYCH: Normally interactive. Conversant. Mildly labile affect. Requires redirection in conversation. Verbose.   Laboratory and Imaging Data:  Assessment and Plan:   Depression, major, single episode, moderate ([place]) - Plan: Ambulatory referral to Psychology  Generalized anxiety disorder - Plan: Ambulatory referral to Psychology  Adjustment insomnia - Plan: Ambulatory referral to Psychology  Grief reaction - Plan: Ambulatory referral to Psychology  >40 minutes spent in face to face time with patient, >50% spent in counselling or coordination of care:  Long conversation with the patient regarding the above.  Globally, I do not think he is doing well, and likely the acute stress, deaths in his family, and impending deaths are all mounting up.  He appears to be depressed, and he also is acutely anxious.  Insomnia is certainly playing a role here.  I'm going to start the patient on Lexapro, initially at 5 mg for one week, then titrating up to 10 mg a day.  I'm also going to give the patient some trazodone to take at nighttime.  Currently he is only sleeping 4 hours per night.  Currently the patient's state of health is harming him in the workplace and in interactions with others per his report to me.  He brings FMLA paperwork to the office, and discusses with me possible time off from work to help recuperate.  I think that this is not unreasonable, and I will complete return to work for 6 weeks from today's time.  Additionally, I will follow-up with him intervally at approximately 5 weeks.  I have  recommended psychological counseling additionally.  The limiting factor in this regard is going to be the speed with which we can make this happen.  I have placed a formal referral for this.  Follow-up: Return in about 1 month (around 08/15/2016).  Future Appointments Date Time Provider [place]  08/19/2016 10:30 AM Macario Shear, Frederico Hamman, MD [id number]    Meds ordered this encounter  Medications  . escitalopram (LEXAPRO) 10 MG tablet    Sig: Take 1 tablet (10 mg total) by mouth daily.    Dispense:  30 tablet    Refill:  2  . traZODone (DESYREL) 50 MG tablet    Sig: Take 1-2 tablets (50-100 mg  total) by mouth at bedtime as needed for sleep.    Dispense:  60 tablet    Refill:  2   Medications Discontinued During This Encounter  Medication Reason  . nystatin (MYCOSTATIN) 100000 UNIT/ML suspension Completed Course  . doxycycline (VIBRA-TABS) 100 MG tablet Completed Course  . Diphenhyd-Hydrocort-Nystatin (FIRST-DUKES MOUTHWASH) SUSP Completed Course   Orders Placed This Encounter  Procedures  . Ambulatory referral to Psychology    Signed,  Frederico Hamman T. Calina Patrie, MD   Allergies as of 07/15/2016   No Known Allergies     Medication List       Accurate as of 07/15/16 11:59 PM. Always use your most recent med list.          aspirin EC 81 MG tablet Take 81 mg by mouth daily.   diphenhydrAMINE 25 mg capsule Commonly known as:  BENADRYL Take 25 mg by mouth at bedtime as needed for allergies.   escitalopram 10 MG tablet Commonly known as:  LEXAPRO Take 1 tablet (10 mg total) by mouth daily.   LORazepam 0.5 MG tablet Commonly known as:  ATIVAN Take 1 tablet (0.5 mg total) by mouth every 8 (eight) hours as needed for anxiety.   multivitamin with minerals tablet Take 1 tablet by mouth daily.   ranitidine 150 MG tablet Commonly known as:  ZANTAC Take 150 mg by mouth daily.   SUDAFED PO Take 1 tablet by mouth daily as needed (allergies).   traZODone 50 MG  tablet Commonly known as:  DESYREL Take 1-2 tablets (50-100 mg total) by mouth at bedtime as needed for sleep.

## 2016-07-17 ENCOUNTER — Telehealth: Payer: Self-pay | Admitting: Family Medicine

## 2016-07-17 NOTE — Telephone Encounter (Signed)
Disability forms in Dr. Lillie Fragmin INbox for review, completion and signature.

## 2016-07-21 ENCOUNTER — Encounter: Payer: Self-pay | Admitting: Family Medicine

## 2016-07-28 NOTE — Telephone Encounter (Signed)
Paperwork faxed 7/17 Spouse aware Copy for pt Copy for file Copy for scan Copy for billing

## 2016-08-19 ENCOUNTER — Ambulatory Visit: Payer: BLUE CROSS/BLUE SHIELD | Admitting: Family Medicine

## 2016-09-11 LAB — HM DIABETES EYE EXAM

## 2016-10-20 ENCOUNTER — Other Ambulatory Visit: Payer: Self-pay | Admitting: Family Medicine

## 2016-10-20 DIAGNOSIS — Z114 Encounter for screening for human immunodeficiency virus [HIV]: Secondary | ICD-10-CM

## 2016-10-20 DIAGNOSIS — Z1159 Encounter for screening for other viral diseases: Secondary | ICD-10-CM

## 2016-10-20 DIAGNOSIS — Z0000 Encounter for general adult medical examination without abnormal findings: Secondary | ICD-10-CM

## 2016-10-21 ENCOUNTER — Other Ambulatory Visit (INDEPENDENT_AMBULATORY_CARE_PROVIDER_SITE_OTHER): Payer: BLUE CROSS/BLUE SHIELD

## 2016-10-21 DIAGNOSIS — Z0000 Encounter for general adult medical examination without abnormal findings: Secondary | ICD-10-CM | POA: Diagnosis not present

## 2016-10-21 DIAGNOSIS — Z1159 Encounter for screening for other viral diseases: Secondary | ICD-10-CM

## 2016-10-21 DIAGNOSIS — Z114 Encounter for screening for human immunodeficiency virus [HIV]: Secondary | ICD-10-CM

## 2016-10-21 LAB — HEPATIC FUNCTION PANEL
ALBUMIN: 4.2 g/dL (ref 3.5–5.2)
ALK PHOS: 72 U/L (ref 39–117)
ALT: 28 U/L (ref 0–53)
AST: 20 U/L (ref 0–37)
Bilirubin, Direct: 0.1 mg/dL (ref 0.0–0.3)
TOTAL PROTEIN: 7.1 g/dL (ref 6.0–8.3)
Total Bilirubin: 0.5 mg/dL (ref 0.2–1.2)

## 2016-10-21 LAB — CBC WITH DIFFERENTIAL/PLATELET
BASOS ABS: 0.1 10*3/uL (ref 0.0–0.1)
Basophils Relative: 1 % (ref 0.0–3.0)
Eosinophils Absolute: 0.6 10*3/uL (ref 0.0–0.7)
Eosinophils Relative: 10.3 % — ABNORMAL HIGH (ref 0.0–5.0)
HCT: 46 % (ref 39.0–52.0)
HEMOGLOBIN: 15.7 g/dL (ref 13.0–17.0)
LYMPHS ABS: 1.3 10*3/uL (ref 0.7–4.0)
LYMPHS PCT: 23.6 % (ref 12.0–46.0)
MCHC: 34.2 g/dL (ref 30.0–36.0)
MCV: 92.5 fl (ref 78.0–100.0)
Monocytes Absolute: 0.5 10*3/uL (ref 0.1–1.0)
Monocytes Relative: 9.3 % (ref 3.0–12.0)
NEUTROS PCT: 55.8 % (ref 43.0–77.0)
Neutro Abs: 3.1 10*3/uL (ref 1.4–7.7)
Platelets: 225 10*3/uL (ref 150.0–400.0)
RBC: 4.97 Mil/uL (ref 4.22–5.81)
RDW: 13.6 % (ref 11.5–15.5)
WBC: 5.5 10*3/uL (ref 4.0–10.5)

## 2016-10-21 LAB — LIPID PANEL
CHOL/HDL RATIO: 6
CHOLESTEROL: 229 mg/dL — AB (ref 0–200)
HDL: 40.8 mg/dL (ref >39.00)
NonHDL: 188.17
TRIGLYCERIDES: 247 mg/dL — AB (ref 0.0–149.0)
VLDL: 49.4 mg/dL — AB (ref 0.0–40.0)

## 2016-10-21 LAB — BASIC METABOLIC PANEL
BUN: 13 mg/dL (ref 6–23)
CHLORIDE: 102 meq/L (ref 96–112)
CO2: 30 meq/L (ref 19–32)
CREATININE: 0.91 mg/dL (ref 0.40–1.50)
Calcium: 9.8 mg/dL (ref 8.4–10.5)
GFR: 89.51 mL/min (ref >60.00)
GLUCOSE: 133 mg/dL — AB (ref 70–99)
Potassium: 4.6 mEq/L (ref 3.5–5.1)
Sodium: 141 mEq/L (ref 135–145)

## 2016-10-21 LAB — PSA: PSA: 1.94 ng/mL (ref 0.10–4.00)

## 2016-10-21 LAB — LDL CHOLESTEROL, DIRECT: LDL DIRECT: 141 mg/dL

## 2016-10-22 ENCOUNTER — Other Ambulatory Visit (INDEPENDENT_AMBULATORY_CARE_PROVIDER_SITE_OTHER): Payer: BLUE CROSS/BLUE SHIELD

## 2016-10-22 DIAGNOSIS — R7303 Prediabetes: Secondary | ICD-10-CM | POA: Diagnosis not present

## 2016-10-22 LAB — HEPATITIS C ANTIBODY
HEP C AB: NONREACTIVE
SIGNAL TO CUT-OFF: 0.01 (ref <1.00)

## 2016-10-22 LAB — HEMOGLOBIN A1C: Hgb A1c MFr Bld: 6.6 % — ABNORMAL HIGH (ref 4.6–6.5)

## 2016-10-22 LAB — HIV ANTIBODY (ROUTINE TESTING W REFLEX): HIV 1&2 Ab, 4th Generation: NONREACTIVE

## 2016-10-26 ENCOUNTER — Ambulatory Visit (INDEPENDENT_AMBULATORY_CARE_PROVIDER_SITE_OTHER): Payer: BLUE CROSS/BLUE SHIELD | Admitting: Family Medicine

## 2016-10-26 ENCOUNTER — Encounter: Payer: Self-pay | Admitting: Family Medicine

## 2016-10-26 VITALS — BP 140/82 | HR 68 | Temp 97.4°F | Ht 69.5 in | Wt 228.5 lb

## 2016-10-26 DIAGNOSIS — Z23 Encounter for immunization: Secondary | ICD-10-CM | POA: Diagnosis not present

## 2016-10-26 DIAGNOSIS — Z0000 Encounter for general adult medical examination without abnormal findings: Secondary | ICD-10-CM

## 2016-10-26 MED ORDER — SILDENAFIL CITRATE 20 MG PO TABS
ORAL_TABLET | ORAL | 11 refills | Status: DC
Start: 2016-10-26 — End: 2017-12-23

## 2016-10-26 NOTE — Progress Notes (Signed)
Dr. Frederico Hamman T. Copland, MD, [facility] [place], [address] Phone: [phone] Fax: [phone]  10/26/2016  Patient: Charles [name], MRN: [id number], DOB: 1955/05/04, 61 y.o.  Primary Physician:  Owens Loffler, MD   Chief Complaint  Patient presents with  . Annual Exam   Subjective:   Charles [name] is a 61 y.o. pleasant patient who presents with the following:  Preventative Health Maintenance Visit:  Health Maintenance Summary Reviewed and updated, unless pt declines services.  Tobacco History Reviewed. Alcohol: No concerns, no excessive use Exercise Habits: Some activity, rec at least 30 mins 5 times a week STD concerns: no risk or activity to increase risk Drug Use: None Encouraged self-testicular check  Health Maintenance  Topic Date Due  . FOOT EXAM  01/18/1964  . OPHTHALMOLOGY EXAM  01/18/1964  . COLONOSCOPY  01/18/2004  . HEMOGLOBIN A1C  04/22/2017  . PNEUMOCOCCAL POLYSACCHARIDE VACCINE (2) 10/26/2021  . TETANUS/TDAP  02/26/2026  . INFLUENZA VACCINE  Completed  . Hepatitis C Screening  Completed  . HIV Screening  Completed   Immunization History  Administered Date(s) Administered  . Influenza,inj,Quad PF,6+ Mos 10/18/2014, 10/26/2016  . Pneumococcal Polysaccharide-23 10/26/2016  . Tdap 02/27/2016   Diabetes Mellitus: Tolerating Medications: none Compliance with diet: fair Exercise: regular Avg blood sugars at home: not checking Foot problems: none Hypoglycemia: none No nausea, vomitting, blurred vision, polyuria.  New onset  Lab Results  Component Value Date   HGBA1C 6.6 (H) 10/22/2016   HGBA1C 6.1 10/18/2014   Lab Results  Component Value Date   CREATININE 0.91 10/21/2016    Wt Readings from Last 3 Encounters:  10/26/16 228 lb 8 oz (103.6 kg)  07/15/16 232 lb 12 oz (105.6 kg)  05/29/16 235 lb 8 oz (106.8 kg)    Body mass index is 33.26 kg/m.   Patient Active  Problem List   Diagnosis Date Noted  . Status post total replacement of right hip 12/07/2014  . Hyperlipidemia 10/21/2014  . Generalized anxiety disorder 10/21/2014  . GERD (gastroesophageal reflux disease)   . Controlled type 2 diabetes mellitus without complication, without long-term current use of insulin ([place])    Past Medical History:  Diagnosis Date  . Anxiety   . Borderline diabetes   . Controlled type 2 diabetes mellitus without complication, without long-term current use of insulin ([place])   . Diverticulitis   . Generalized anxiety disorder 10/21/2014  . GERD (gastroesophageal reflux disease)    Past Surgical History:  Procedure Laterality Date  . ACHILLES TENDON REPAIR  2001  . BICEPS TENDON REPAIR Left 2014  . colonscopy     . HEMORROIDECTOMY  2012  . ROTATOR CUFF REPAIR Left 2014  . TOTAL HIP ARTHROPLASTY Right 12/07/2014   Procedure: RIGHT TOTAL HIP ARTHROPLASTY ANTERIOR APPROACH;  Surgeon: Mcarthur Rossetti, MD;  Location: [facility];  Service: Orthopedics;  Laterality: Right;   Social History   Social History  . Marital status: Married    Spouse name: N/A  . Number of children: N/A  . Years of education: N/A   Occupational History  . insurance adj    Social History Main Topics  . Smoking status: Former Smoker    Packs/day: 0.50    Years: 20.00    Types: Cigarettes    Quit date: 01/06/2000  . Smokeless tobacco: Former Systems developer    Types: Chew    Quit date: 01/06/2004  . Alcohol use No  Comment: occ/ heavy drinker in past   . Drug use: No  . Sexual activity: Yes    Partners: Female   Other Topics Concern  . Not on file   Social History Narrative   Married, Arts administrator baseball player   Avid golfer   Family History  Problem Relation Age of Onset  . Alcohol abuse Father   . Heart attack Father   . Hyperlipidemia Father   . Stroke Father   . Hypertension Father   . Breast cancer Mother   . Hypertension Mother    No Known  Allergies  Medication list has been reviewed and updated.   General: Denies fever, chills, sweats. No significant weight loss. Eyes: Denies blurring,significant itching ENT: Denies earache, sore throat, and hoarseness. Cardiovascular: Denies chest pains, palpitations, dyspnea on exertion Respiratory: Denies cough, dyspnea at rest,wheeezing Breast: no concerns about lumps GI: Denies nausea, vomiting, diarrhea, constipation, change in bowel habits, abdominal pain, melena, hematochezia GU: Occ ED Musculoskeletal: Denies back pain, joint pain Derm: Denies rash, itching Neuro: Denies  paresthesias, frequent falls, frequent headaches Psych: Denies depression, anxiety Endocrine: Denies cold intolerance, heat intolerance, polydipsia Heme: Denies enlarged lymph nodes Allergy: No hayfever  Objective:   BP 140/82   Pulse 68   Temp (!) 97.4 F (36.3 C) (Oral)   Ht 5' 9.5" (1.765 m)   Wt 228 lb 8 oz (103.6 kg)   BMI 33.26 kg/m  Ideal Body Weight: Weight in (lb) to have BMI = 25: 171.4  No exam data present  GEN: well developed, well nourished, no acute distress Eyes: conjunctiva and lids normal, PERRLA, EOMI ENT: TM clear, nares clear, oral exam WNL Neck: supple, no lymphadenopathy, no thyromegaly, no JVD Pulm: clear to auscultation and percussion, respiratory effort normal CV: regular rate and rhythm, S1-S2, no murmur, rub or gallop, no bruits, peripheral pulses normal and symmetric, no cyanosis, clubbing, edema or varicosities GI: soft, non-tender; no hepatosplenomegaly, masses; active bowel sounds all quadrants GU: no hernia, testicular mass, penile discharge Lymph: no cervical, axillary or inguinal adenopathy MSK: gait normal, muscle tone and strength WNL, no joint swelling, effusions, discoloration, crepitus  SKIN: clear, good turgor, color WNL, no rashes, lesions, or ulcerations Neuro: normal mental status, normal strength, sensation, and motion Psych: alert; oriented to  person, place and time, normally interactive and not anxious or depressed in appearance. All labs reviewed with patient.  Lipids:    Component Value Date/Time   CHOL 229 (H) 10/21/2016 0833   TRIG 247.0 (H) 10/21/2016 0833   HDL 40.80 10/21/2016 0833   LDLDIRECT 141.0 10/21/2016 0833   VLDL 49.4 (H) 10/21/2016 0833   CHOLHDL 6 10/21/2016 0833   CBC: CBC Latest Ref Rng & Units 10/21/2016 12/08/2014 12/03/2014  WBC 4.0 - 10.5 K/uL 5.5 10.4 6.7  Hemoglobin 13.0 - 17.0 g/dL 15.7 11.9(L) 15.5  Hematocrit 39.0 - 52.0 % 46.0 35.9(L) 45.2  Platelets 150.0 - 400.0 K/uL 225.0 197 159    Basic Metabolic Panel:    Component Value Date/Time   NA 141 10/21/2016 0833   K 4.6 10/21/2016 0833   CL 102 10/21/2016 0833   CO2 30 10/21/2016 0833   BUN 13 10/21/2016 0833   CREATININE 0.91 10/21/2016 0833   GLUCOSE 133 (H) 10/21/2016 0833   CALCIUM 9.8 10/21/2016 0833   Hepatic Function Latest Ref Rng & Units 10/21/2016 10/18/2014  Total Protein 6.0 - 8.3 g/dL 7.1 7.7  Albumin 3.5 - 5.2 g/dL 4.2 4.4  AST  0 - 37 U/L 20 20  ALT 0 - 53 U/L 28 32  Alk Phosphatase 39 - 117 U/L 72 85  Total Bilirubin 0.2 - 1.2 mg/dL 0.5 0.6  Bilirubin, Direct 0.0 - 0.3 mg/dL 0.1 0.1    No results found for: TSH Lab Results  Component Value Date   PSA 1.94 10/21/2016   PSA 1.41 10/18/2014    Assessment and Plan:   Healthcare maintenance  Need for prophylactic vaccination and inoculation against influenza - Plan: Flu Vaccine QUAD 36+ mos IM  Need for prophylactic vaccination against Streptococcus pneumoniae (pneumococcus) - Plan: Pneumococcal polysaccharide vaccine 23-valent greater than or equal to 2yo subcutaneous/IM  New-onset diabetes, this point his A1c is 6.6 and were going to do a lifestyle challenge with increased activity and diet changes.  He is scheduled to retire in November.  Health Maintenance Exam: The patient's preventative maintenance and recommended screening tests for an annual wellness  exam were reviewed in full today. Brought up to date unless services declined.  Counselled on the importance of diet, exercise, and its role in overall health and mortality. The patient's FH and SH was reviewed, including their home life, tobacco status, and drug and alcohol status.  Follow-up in 1 year for physical exam or additional follow-up below.  Follow-up: Return in about 6 months (around 04/26/2017). Or follow-up in 1 year if not noted.  Meds ordered this encounter  Medications  . sildenafil (REVATIO) 20 MG tablet    Sig: Generic Revatio / Sildanefil 20 mg. 1 - 5 tabs 30 mins prior to intercourse.    Dispense:  20 tablet    Refill:  11   Medications Discontinued During This Encounter  Medication Reason  . escitalopram (LEXAPRO) 10 MG tablet Completed Course  . diphenhydrAMINE (BENADRYL) 25 mg capsule Completed Course  . traZODone (DESYREL) 50 MG tablet Completed Course   Orders Placed This Encounter  Procedures  . Flu Vaccine QUAD 36+ mos IM  . Pneumococcal polysaccharide vaccine 23-valent greater than or equal to 2yo subcutaneous/IM    Signed,  Spencer T. Copland, MD   Allergies as of 10/26/2016   No Known Allergies     Medication List       Accurate as of 10/26/16 11:59 PM. Always use your most recent med list.          aspirin EC 81 MG tablet Take 81 mg by mouth daily.   LORazepam 0.5 MG tablet Commonly known as:  ATIVAN Take 1 tablet (0.5 mg total) by mouth every 8 (eight) hours as needed for anxiety.   multivitamin with minerals tablet Take 1 tablet by mouth daily.   ranitidine 150 MG tablet Commonly known as:  ZANTAC Take 150 mg by mouth daily.   sildenafil 20 MG tablet Commonly known as:  REVATIO Generic Revatio / Sildanefil 20 mg. 1 - 5 tabs 30 mins prior to intercourse.   SUDAFED PO Take 1 tablet by mouth daily as needed (allergies).

## 2016-10-27 ENCOUNTER — Encounter: Payer: Self-pay | Admitting: Family Medicine

## 2017-04-19 ENCOUNTER — Other Ambulatory Visit (INDEPENDENT_AMBULATORY_CARE_PROVIDER_SITE_OTHER): Payer: BLUE CROSS/BLUE SHIELD

## 2017-04-19 DIAGNOSIS — E119 Type 2 diabetes mellitus without complications: Secondary | ICD-10-CM

## 2017-04-19 DIAGNOSIS — Z0000 Encounter for general adult medical examination without abnormal findings: Secondary | ICD-10-CM

## 2017-04-19 LAB — CBC WITH DIFFERENTIAL/PLATELET
BASOS ABS: 0.1 10*3/uL (ref 0.0–0.1)
Basophils Relative: 1 % (ref 0.0–3.0)
EOS PCT: 7.6 % — AB (ref 0.0–5.0)
Eosinophils Absolute: 0.5 10*3/uL (ref 0.0–0.7)
HEMATOCRIT: 45.4 % (ref 39.0–52.0)
HEMOGLOBIN: 15.3 g/dL (ref 13.0–17.0)
LYMPHS ABS: 1.6 10*3/uL (ref 0.7–4.0)
LYMPHS PCT: 23.9 % (ref 12.0–46.0)
MCHC: 33.8 g/dL (ref 30.0–36.0)
MCV: 92.8 fl (ref 78.0–100.0)
MONOS PCT: 9.8 % (ref 3.0–12.0)
Monocytes Absolute: 0.6 10*3/uL (ref 0.1–1.0)
NEUTROS PCT: 57.7 % (ref 43.0–77.0)
Neutro Abs: 3.8 10*3/uL (ref 1.4–7.7)
Platelets: 230 10*3/uL (ref 150.0–400.0)
RBC: 4.89 Mil/uL (ref 4.22–5.81)
RDW: 13.3 % (ref 11.5–15.5)
WBC: 6.6 10*3/uL (ref 4.0–10.5)

## 2017-04-19 LAB — BASIC METABOLIC PANEL
BUN: 19 mg/dL (ref 6–23)
CALCIUM: 9.4 mg/dL (ref 8.4–10.5)
CO2: 28 mEq/L (ref 19–32)
Chloride: 103 mEq/L (ref 96–112)
Creatinine, Ser: 1.04 mg/dL (ref 0.40–1.50)
GFR: 76.6 mL/min (ref >60.00)
Glucose, Bld: 104 mg/dL — ABNORMAL HIGH (ref 70–99)
Potassium: 4.3 mEq/L (ref 3.5–5.1)
SODIUM: 138 meq/L (ref 135–145)

## 2017-04-19 LAB — LIPID PANEL
Cholesterol: 206 mg/dL — ABNORMAL HIGH (ref 0–200)
HDL: 39.1 mg/dL (ref >39.00)
LDL Cholesterol: 136 mg/dL — ABNORMAL HIGH (ref 0–99)
NONHDL: 166.55
TRIGLYCERIDES: 153 mg/dL — AB (ref 0.0–149.0)
Total CHOL/HDL Ratio: 5
VLDL: 30.6 mg/dL (ref 0.0–40.0)

## 2017-04-19 LAB — HEPATIC FUNCTION PANEL
ALBUMIN: 4.2 g/dL (ref 3.5–5.2)
ALT: 25 U/L (ref 0–53)
AST: 17 U/L (ref 0–37)
Alkaline Phosphatase: 67 U/L (ref 39–117)
Bilirubin, Direct: 0.1 mg/dL (ref 0.0–0.3)
TOTAL PROTEIN: 7 g/dL (ref 6.0–8.3)
Total Bilirubin: 0.7 mg/dL (ref 0.2–1.2)

## 2017-04-19 LAB — HEMOGLOBIN A1C: Hgb A1c MFr Bld: 6.1 % (ref 4.6–6.5)

## 2017-04-19 LAB — PSA: PSA: 1.68 ng/mL (ref 0.10–4.00)

## 2017-04-26 ENCOUNTER — Ambulatory Visit: Payer: BLUE CROSS/BLUE SHIELD | Admitting: Family Medicine

## 2017-04-29 ENCOUNTER — Ambulatory Visit (INDEPENDENT_AMBULATORY_CARE_PROVIDER_SITE_OTHER): Payer: BLUE CROSS/BLUE SHIELD | Admitting: Family Medicine

## 2017-04-29 ENCOUNTER — Encounter: Payer: Self-pay | Admitting: Family Medicine

## 2017-04-29 ENCOUNTER — Other Ambulatory Visit: Payer: Self-pay

## 2017-04-29 DIAGNOSIS — E119 Type 2 diabetes mellitus without complications: Secondary | ICD-10-CM

## 2017-04-29 LAB — MICROALBUMIN / CREATININE URINE RATIO
Creatinine,U: 135.7 mg/dL
Microalb Creat Ratio: 0.4 mg/g (ref 0.0–30.0)
Microalb, Ur: 0.6 mg/dL (ref 0.0–1.9)

## 2017-04-29 MED ORDER — LORAZEPAM 0.5 MG PO TABS: 0.5000 mg | ORAL_TABLET | Freq: Three times a day (TID) | ORAL | 0 refills | Status: DC | PRN

## 2017-04-29 NOTE — Progress Notes (Signed)
Dr. Frederico Hamman T. Rihan Schueler, MD, [facility] and [facility] Medicine [place], [address] Phone: [phone] Fax: [phone]  04/29/2017  Patient: Charles [name], MRN: [id number], DOB: 1955/03/19, 62 y.o.  Primary Physician:  Owens Loffler, MD   Chief Complaint  Patient presents with  . Diabetes   Subjective:   Charles [name] is a 62 y.o. very pleasant male patient who presents with the following:  Charles [name] is thriving in his retirement, and without any medication he dropped his A1c by 0.5, and he looks and feels better than he has a long time.  He is playing golf about 3 times a week.  Walking each time.  Overall he is feeling really very good.  Diabetes Mellitus: Tolerating Medications: yes Compliance with diet: fair Exercise: minimal / intermittent Avg blood sugars at home: not checking Foot problems: none Hypoglycemia: none No nausea, vomitting, blurred vision, polyuria.  Lab Results  Component Value Date   HGBA1C 6.1 04/19/2017   HGBA1C 6.6 (H) 10/22/2016   HGBA1C 6.1 10/18/2014   Lab Results  Component Value Date   MICROALBUR 0.6 04/29/2017   LDLCALC 136 (H) 04/19/2017   CREATININE 1.04 04/19/2017    Wt Readings from Last 3 Encounters:  04/29/17 217 lb 12 oz (98.8 kg)  10/26/16 228 lb 8 oz (103.6 kg)  07/15/16 232 lb 12 oz (105.6 kg)    Body mass index is 31.7 kg/m.   Past Medical History, Surgical History, Social History, Family History, Problem List, Medications, and Allergies have been reviewed and updated if relevant.  Patient Active Problem List   Diagnosis Date Noted  . Status post total replacement of right hip 12/07/2014  . Hyperlipidemia 10/21/2014  . Generalized anxiety disorder 10/21/2014  . GERD (gastroesophageal reflux disease)   . Controlled type 2 diabetes mellitus without complication, without long-term current use of insulin ([place])     Past Medical History:  Diagnosis Date  . Anxiety   .  Borderline diabetes   . Controlled type 2 diabetes mellitus without complication, without long-term current use of insulin ([place])   . Diverticulitis   . Generalized anxiety disorder 10/21/2014  . GERD (gastroesophageal reflux disease)     Past Surgical History:  Procedure Laterality Date  . ACHILLES TENDON REPAIR  2001  . BICEPS TENDON REPAIR Left 2014  . colonscopy     . HEMORROIDECTOMY  2012  . ROTATOR CUFF REPAIR Left 2014  . TOTAL HIP ARTHROPLASTY Right 12/07/2014   Procedure: RIGHT TOTAL HIP ARTHROPLASTY ANTERIOR APPROACH;  Surgeon: Mcarthur Rossetti, MD;  Location: [facility];  Service: Orthopedics;  Laterality: Right;    Social History   Socioeconomic History  . Marital status: Married    Spouse name: Not on file  . Number of children: Not on file  . Years of education: Not on file  . Highest education level: Not on file  Occupational History  . Occupation: insurance adj  Social Needs  . Financial resource strain: Not on file  . Food insecurity:    Worry: Not on file    Inability: Not on file  . Transportation needs:    Medical: Not on file    Non-medical: Not on file  Tobacco Use  . Smoking status: Former Smoker    Packs/day: 0.50    Years: 20.00    Pack years: 10.00    Types: Cigarettes    Last attempt to quit: 01/06/2000    Years since quitting:  17.3  . Smokeless tobacco: Former Systems developer    Types: [place] date: 01/06/2004  Substance and Sexual Activity  . Alcohol use: No    Alcohol/week: 0.0 oz    Comment: occ/ heavy drinker in past   . Drug use: No  . Sexual activity: Yes    Partners: Female  Lifestyle  . Physical activity:    Days per week: Not on file    Minutes per session: Not on file  . Stress: Not on file  Relationships  . Social connections:    Talks on phone: Not on file    Gets together: Not on file    Attends religious service: Not on file    Active member of club or organization: Not on file    Attends meetings of clubs or  organizations: Not on file    Relationship status: Not on file  . Intimate partner violence:    Fear of current or ex partner: Not on file    Emotionally abused: Not on file    Physically abused: Not on file    Forced sexual activity: Not on file  Other Topics Concern  . Not on file  Social History Narrative   Married, Arts administrator baseball player   Avid golfer    Family History  Problem Relation Age of Onset  . Alcohol abuse Father   . Heart attack Father   . Hyperlipidemia Father   . Stroke Father   . Hypertension Father   . Breast cancer Mother   . Hypertension Mother     No Known Allergies  Medication list reviewed and updated in full in [place].  GEN: No acute illnesses, no fevers, chills. GI: No n/v/d, eating normally Pulm: No SOB Interactive and getting along well at home.  Otherwise, ROS is as per the HPI.  Objective:   BP 110/80   Pulse 63   Temp 97.6 F (36.4 C) (Oral)   Ht 5' 9.5" (1.765 m)   Wt 217 lb 12 oz (98.8 kg)   BMI 31.70 kg/m   GEN: WDWN, NAD, Non-toxic, A & O x 3 HEENT: Atraumatic, Normocephalic. Neck supple. No masses, No LAD. Ears and Nose: No external deformity. CV: RRR, No M/G/R. No JVD. No thrill. No extra heart sounds. PULM: CTA B, no wheezes, crackles, rhonchi. No retractions. No resp. distress. No accessory muscle use. EXTR: No c/c/e NEURO Normal gait.  PSYCH: Normally interactive. Conversant. Not depressed or anxious appearing.  Calm demeanor.   Laboratory and Imaging Data: Results for orders placed or performed in visit on 04/29/17  Microalbumin / creatinine urine ratio  Result Value Ref Range   Microalb, Ur 0.6 0.0 - 1.9 mg/dL   Creatinine,U 135.7 mg/dL   Microalb Creat Ratio 0.4 0.0 - 30.0 mg/g  HM DIABETES EYE EXAM  Result Value Ref Range   HM Diabetic Eye Exam No Retinopathy No Retinopathy     Assessment and Plan:   Controlled type 2 diabetes mellitus without complication, without  long-term current use of insulin ([place]) - Plan: Microalbumin / creatinine urine ratio, CANCELED: Microalbumin / creatinine urine ratio  He is really doing great.  Keep up the good work.  Follow-up: Return in about 6 months (around 10/29/2017) for full Physical.  Meds ordered this encounter  Medications  . DISCONTD: LORazepam (ATIVAN) 0.5 MG tablet    Sig: Take 1 tablet (0.5 mg total) by mouth every 8 (eight) hours as needed for anxiety.  Dispense:  30 tablet    Refill:  0  . LORazepam (ATIVAN) 0.5 MG tablet    Sig: Take 1 tablet (0.5 mg total) by mouth every 8 (eight) hours as needed for anxiety.    Dispense:  30 tablet    Refill:  0   Orders Placed This Encounter  Procedures  . Microalbumin / creatinine urine ratio  . HM DIABETES EYE EXAM    Signed,  Elycia Woodside T. Krystine Pabst, MD   Allergies as of 04/29/2017   No Known Allergies     Medication List        Accurate as of 04/29/17 11:59 PM. Always use your most recent med list.          aspirin EC 81 MG tablet Take 81 mg by mouth daily.   LORazepam 0.5 MG tablet Commonly known as:  ATIVAN Take 1 tablet (0.5 mg total) by mouth every 8 (eight) hours as needed for anxiety.   multivitamin with minerals tablet Take 1 tablet by mouth daily.   Omega 3 1200 MG Caps   ranitidine 150 MG tablet Commonly known as:  ZANTAC Take 150 mg by mouth daily.   sildenafil 20 MG tablet Commonly known as:  REVATIO Generic Revatio / Sildanefil 20 mg. 1 - 5 tabs 30 mins prior to intercourse.   SUDAFED PO Take 1 tablet by mouth daily as needed (allergies).   ZYRTEC ALLERGY 10 MG Caps Generic drug:  Cetirizine HCl

## 2017-05-25 ENCOUNTER — Encounter: Payer: Self-pay | Admitting: Internal Medicine

## 2017-05-25 ENCOUNTER — Ambulatory Visit (INDEPENDENT_AMBULATORY_CARE_PROVIDER_SITE_OTHER): Payer: BLUE CROSS/BLUE SHIELD | Admitting: Internal Medicine

## 2017-05-25 VITALS — BP 124/78 | HR 78 | Temp 98.4°F | Wt 218.0 lb

## 2017-05-25 DIAGNOSIS — J209 Acute bronchitis, unspecified: Secondary | ICD-10-CM | POA: Diagnosis not present

## 2017-05-25 MED ORDER — HYDROCODONE-HOMATROPINE 5-1.5 MG/5ML PO SYRP: 5.0000 mL | ORAL_SOLUTION | Freq: Three times a day (TID) | ORAL | 0 refills | Status: DC | PRN

## 2017-05-25 MED ORDER — DOXYCYCLINE HYCLATE 100 MG PO TABS: 100.0000 mg | ORAL_TABLET | Freq: Two times a day (BID) | ORAL | 0 refills | Status: DC

## 2017-05-25 NOTE — Patient Instructions (Signed)

## 2017-05-25 NOTE — Progress Notes (Signed)
HPI  Pt presents to the clinic today with c/o headache, cough and chest congestion. He reports this started 1-2 weeks ago. He describes the headache as pressure. He denies dizziness or visual changes. The cough is non productive. He denies shortness of breath. He denies fever or body aches but has has some chills. He has tried Ibuprofen and Robitussin DM with minimal relief. He has a history of DM 2, UTD on his pneumovax. He has not had sick contacts.  Review of Systems      Past Medical History:  Diagnosis Date  . Anxiety   . Borderline diabetes   . Controlled type 2 diabetes mellitus without complication, without long-term current use of insulin ([place])   . Diverticulitis   . Generalized anxiety disorder 10/21/2014  . GERD (gastroesophageal reflux disease)     Family History  Problem Relation Age of Onset  . Alcohol abuse Father   . Heart attack Father   . Hyperlipidemia Father   . Stroke Father   . Hypertension Father   . Breast cancer Mother   . Hypertension Mother     Social History   Socioeconomic History  . Marital status: Married    Spouse name: Not on file  . Number of children: Not on file  . Years of education: Not on file  . Highest education level: Not on file  Occupational History  . Occupation: insurance adj  Social Needs  . Financial resource strain: Not on file  . Food insecurity:    Worry: Not on file    Inability: Not on file  . Transportation needs:    Medical: Not on file    Non-medical: Not on file  Tobacco Use  . Smoking status: Former Smoker    Packs/day: 0.50    Years: 20.00    Pack years: 10.00    Types: Cigarettes    Last attempt to quit: 01/06/2000    Years since quitting: 17.3  . Smokeless tobacco: Former Systems developer    Types: [place] date: 01/06/2004  Substance and Sexual Activity  . Alcohol use: No    Alcohol/week: 0.0 oz    Comment: occ/ heavy drinker in past   . Drug use: No  . Sexual activity: Yes    Partners: Female   Lifestyle  . Physical activity:    Days per week: Not on file    Minutes per session: Not on file  . Stress: Not on file  Relationships  . Social connections:    Talks on phone: Not on file    Gets together: Not on file    Attends religious service: Not on file    Active member of club or organization: Not on file    Attends meetings of clubs or organizations: Not on file    Relationship status: Not on file  . Intimate partner violence:    Fear of current or ex partner: Not on file    Emotionally abused: Not on file    Physically abused: Not on file    Forced sexual activity: Not on file  Other Topics Concern  . Not on file  Social History Narrative   Married, Arts administrator baseball player   Avid golfer    No Known Allergies   Constitutional: Positive headache. Denies fatigue, fever or abrupt weight changes.  HEENT: Denies eye redness, eye pain, pressure behind the eyes, facial pain, nasal congestion, ear pain, ringing in the ears, wax buildup, runny nose  or sore throat. Respiratory: Positive cough. Denies difficulty breathing or shortness of breath.  Cardiovascular: Denies chest pain, chest tightness, palpitations or swelling in the hands or feet.   No other specific complaints in a complete review of systems (except as listed in HPI above).  Objective:   BP 124/78   Pulse 78   Temp 98.4 F (36.9 C) (Oral)   Wt 218 lb (98.9 kg)   SpO2 98%   BMI 31.73 kg/m   Wt Readings from Last 3 Encounters:  05/25/17 218 lb (98.9 kg)  04/29/17 217 lb 12 oz (98.8 kg)  10/26/16 228 lb 8 oz (103.6 kg)     General: Appears his stated age, in NAD. HEENT: Head: normal shape and size, no sinus tenderness noted; Throat/Mouth: Teeth present, mucosa erythematous and moist, no exudate noted, no lesions or ulcerations noted.  Neck: No cervical lymphadenopathy.  Cardiovascular: Normal rate and rhythm.  Pulmonary/Chest: Normal effort with scattered rhonchi throughout.  No respiratory distress. No wheezes, rales noted.       Assessment & Plan:   Acute Bronchitis:  Get some rest and drink plenty of water eRx for Doxycycline BID x 10 days eRx for Hycodan cough syrup  RTC as needed or if symptoms persist.   Webb Silversmith, NP

## 2017-06-12 ENCOUNTER — Telehealth: Payer: BLUE CROSS/BLUE SHIELD | Admitting: Family Medicine

## 2017-06-12 DIAGNOSIS — R0981 Nasal congestion: Secondary | ICD-10-CM

## 2017-06-12 MED ORDER — LEVOCETIRIZINE DIHYDROCHLORIDE 5 MG PO TABS: 5.0000 mg | ORAL_TABLET | Freq: Every evening | ORAL | 1 refills | Status: DC

## 2017-06-12 MED ORDER — IPRATROPIUM BROMIDE 0.03 % NA SOLN: 2.0000 | Freq: Two times a day (BID) | NASAL | 0 refills | Status: DC

## 2017-06-12 MED ORDER — NAPROXEN 500 MG PO TABS: 500.0000 mg | ORAL_TABLET | Freq: Two times a day (BID) | ORAL | 0 refills | Status: DC

## 2017-06-12 NOTE — Progress Notes (Signed)
We are sorry you are not feeling well.Here is how we plan to help!  Based on what you have shared with me, it looks like you may have a viral type illness resulting in an exacerbation of allergy induced upper respiratory symptoms.This is likely the reason symptoms returned once completion of antibiotic as virus and allergy related symptoms are non-responsive to antibiotics.   The most common symptoms associated with the common cold and or allergy induced symptoms are nasal discharge or congestion, cough, sneezing, headache and pressure in the ears and face.Cold symptoms usually persist for about 3 to 10 days, but can last up to 2 weeks.It is important to know that colds do not cause serious illness or complications in most cases.  This is an infection that is most likely caused by a virus. There are no specific treatments for the common cold other than to help you with the symptoms until the infection runs its course.  Today I recommend symptomatic treatment only and have sent the following medications to your pharmacy: Discontinue Zyrtec and Mucinex. Start Levocetirizine 5 mg once daily at bedtime for antihistamine therapy. For nasal congestion, I recommend Atrovent nasal spray, 2 sprays, 3 times daily. Increase intake of fluids, 6-8 eight ounce of water per day. I also recommend antiinflammatory, Naproxen 500 mg twice daily with foods for 3-5 days to relief headache or facial pressure pain likely caused by sinus inflammation.    If you do not have a history of heart disease, hypertension, diabetes or thyroid disease, prostate/bladder issues or glaucoma, you may also use Sudafed to treat nasal congestion.It is highly recommended that you consult with a pharmacist or your primary care physician to ensure this medication is safe for you to take.   If you have a sore or scratchy throat, use a saltwater gargle-  to  teaspoon of salt dissolved in a 4-ounce to 8-ounce glass of warm  water.Gargle the solution for approximately 15-30 seconds and then spit.It is important not to swallow the solution.You can also use throat lozenges/cough drops and Chloraseptic spray to help with throat pain or discomfort.Warm or cold liquids can also be helpful in relieving throat pain.  Some authorities believe that zinc sprays or the use of Echinacea may shorten the course of your symptoms.   HOME CARE Only take medications as instructed by your medical team. Be sure to drink plenty of fluids. Water is fine as well as fruit juices, sodas and electrolyte beverages. You may want to stay away from caffeine or alcohol. If you are nauseated, try taking small sips of liquids. How do you know if you are getting enough fluid? Your urine should be a pale yellow or almost colorless. Get rest. Taking a steamy shower or using a humidifier may help nasal congestion and ease sore throat pain. You can place a towel over your head and breathe in the steam from hot water coming from a faucet. Using a saline nasal spray works much the same way. Cough drops, hard candies and sore throat lozenges may ease your cough. Avoid close contacts especially the very young and the elderly Cover your mouth if you cough or sneeze Always remember to wash your hands.   GET HELP RIGHT AWAY IF: You develop worsening fever. If your symptoms do not improve within 10 days You become short of breath. You develop yellow or green discharge from your nose over 3 days. You have coughing fits You develop a severe head ache or visual changes. You develop  shortness of breath or difficulty breathing. Your symptoms persist after you have completed your treatment plan  MAKE SURE YOU  Understand these instructions. Will watch your condition. Will get help right away if you are not doing well or get worse.  Your e-visit answers were reviewed by a board certified advanced clinical practitioner to complete your personal care  plan. Depending upon the condition, your plan could have included both over the counter or prescription medications. Please review your pharmacy choice. If there is a problem, you may call our nursing hot line at and have the prescription routed to another pharmacy. Your safety is important to [place]. If you have drug allergies check your prescription carefully.   Carroll Sage. Kenton Kingfisher, MSN, FNP-C [place]

## 2017-06-30 ENCOUNTER — Other Ambulatory Visit: Payer: Self-pay | Admitting: *Deleted

## 2017-06-30 ENCOUNTER — Ambulatory Visit (INDEPENDENT_AMBULATORY_CARE_PROVIDER_SITE_OTHER): Payer: BLUE CROSS/BLUE SHIELD | Admitting: Family Medicine

## 2017-06-30 ENCOUNTER — Encounter: Payer: Self-pay | Admitting: Family Medicine

## 2017-06-30 ENCOUNTER — Encounter: Payer: Self-pay | Admitting: *Deleted

## 2017-06-30 VITALS — BP 110/76 | HR 65 | Temp 97.8°F | Ht 69.5 in | Wt 214.5 lb

## 2017-06-30 DIAGNOSIS — J392 Other diseases of pharynx: Secondary | ICD-10-CM

## 2017-06-30 DIAGNOSIS — Z87891 Personal history of nicotine dependence: Secondary | ICD-10-CM | POA: Diagnosis not present

## 2017-06-30 DIAGNOSIS — F411 Generalized anxiety disorder: Secondary | ICD-10-CM

## 2017-06-30 DIAGNOSIS — Z9889 Other specified postprocedural states: Secondary | ICD-10-CM

## 2017-06-30 MED ORDER — LORAZEPAM 0.5 MG PO TABS: 0.5000 mg | ORAL_TABLET | Freq: Three times a day (TID) | ORAL | 1 refills | Status: DC | PRN

## 2017-06-30 NOTE — Progress Notes (Signed)
Dr. Frederico Hamman T. Jaelan Rasheed, MD, [facility] [place], [address] Phone: [phone] Fax: [phone]  06/30/2017  Patient: RUDY LUHMANN, MRN: [id number], DOB: [birth year], 62 y.o.  Primary Physician:  Owens Loffler, MD   Chief Complaint  Patient presents with  . White Spot on throat  . Tendonitis    Right  . Anxiety    Refill Ativan   Subjective:   DAYMON HORA is a 62 y.o. very pleasant male patient who presents with the following:  5/21 - Saw R. Baity.   White spot in throat. Hard as a rock.  He also has some black spots adjacent to this as well. Variably, both he and his wife said that this is been present anywhere from 10 days ago to 4 weeks ago. It is minorly tender to palpation.  He also does have a history of generalized anxiety.  He has taken some rare Ativan for this.  He also has a history of Achilles tendon repair and revision on the right.  He had some discomfort in his Achilles region while playing golf this weekend, and he wanted me to evaluate this also.  Harle Battiest - started around 62 years old [place] - 30 years plus, 1 tin a day Smoked 20 + please, 1 PPD  Drank, 6 pack a day before current marriage.  4 DUI's back then.  At times case over the weekend.   Past Medical History, Surgical History, Social History, Family History, Problem List, Medications, and Allergies have been reviewed and updated if relevant.  Patient Active Problem List   Diagnosis Date Noted  . Status post total replacement of right hip 12/07/2014  . Hyperlipidemia 10/21/2014  . Generalized anxiety disorder 10/21/2014  . GERD (gastroesophageal reflux disease)   . Controlled type 2 diabetes mellitus without complication, without long-term current use of insulin ([place])   . S/P Achilles tendon repair 01/06/2000    Past Medical History:  Diagnosis Date  . Anxiety   . Borderline diabetes   . Controlled type 2 diabetes  mellitus without complication, without long-term current use of insulin ([place])   . Diverticulitis   . Generalized anxiety disorder 10/21/2014  . GERD (gastroesophageal reflux disease)     Past Surgical History:  Procedure Laterality Date  . ACHILLES TENDON REPAIR  2001  . BICEPS TENDON REPAIR Left 2014  . colonscopy     . HEMORROIDECTOMY  2012  . ROTATOR CUFF REPAIR Left 2014  . TOTAL HIP ARTHROPLASTY Right 12/07/2014   Procedure: RIGHT TOTAL HIP ARTHROPLASTY ANTERIOR APPROACH;  Surgeon: Mcarthur Rossetti, MD;  Location: [facility];  Service: Orthopedics;  Laterality: Right;    Social History   Socioeconomic History  . Marital status: Married    Spouse name: Not on file  . Number of children: Not on file  . Years of education: Not on file  . Highest education level: Not on file  Occupational History  . Occupation: insurance adj  Social Needs  . Financial resource strain: Not on file  . Food insecurity:    Worry: Not on file    Inability: Not on file  . Transportation needs:    Medical: Not on file    Non-medical: Not on file  Tobacco Use  . Smoking status: Former Smoker    Packs/day: 0.50    Years: 20.00    Pack years: 10.00    Types: Cigarettes    Last attempt to quit:  01/06/2000    Years since quitting: 17.4  . Smokeless tobacco: Former Systems developer    Types: [place] date: 01/06/2004  Substance and Sexual Activity  . Alcohol use: No    Alcohol/week: 0.0 oz    Comment: occ/ heavy drinker in past   . Drug use: No  . Sexual activity: Yes    Partners: Female  Lifestyle  . Physical activity:    Days per week: Not on file    Minutes per session: Not on file  . Stress: Not on file  Relationships  . Social connections:    Talks on phone: Not on file    Gets together: Not on file    Attends religious service: Not on file    Active member of club or organization: Not on file    Attends meetings of clubs or organizations: Not on file    Relationship status: Not on file   . Intimate partner violence:    Fear of current or ex partner: Not on file    Emotionally abused: Not on file    Physically abused: Not on file    Forced sexual activity: Not on file  Other Topics Concern  . Not on file  Social History Narrative   Married, Arts administrator baseball player   Avid golfer    Family History  Problem Relation Age of Onset  . Alcohol abuse Father   . Heart attack Father   . Hyperlipidemia Father   . Stroke Father   . Hypertension Father   . Breast cancer Mother   . Hypertension Mother     No Known Allergies  Medication list reviewed and updated in full in [place].   GEN: No acute illnesses, no fevers, chills. GI: No n/v/d, eating normally Pulm: No SOB Interactive and getting along well at home.  Otherwise, ROS is as per the HPI.  Objective:   BP 110/76   Pulse 65   Temp 97.8 F (36.6 C) (Oral)   Ht 5' 9.5" (1.765 m)   Wt 214 lb 8 oz (97.3 kg)   BMI 31.22 kg/m   GEN: WDWN, NAD, Non-toxic, A & O x 3 HEENT: Atraumatic, Normocephalic. Neck supple. No masses, No LAD.  I evaluated the posterior aspect of the patient's oropharynx, and I do see a whitish lesion that is approximately 6-8 millimeters across.  Inferior to this there also some black lesions. Ears and Nose: No external deformity.  Tympanic membrane is clear. CV: RRR, No M/G/R. No JVD. No thrill. No extra heart sounds. PULM: CTA B, no wheezes, crackles, rhonchi. No retractions. No resp. distress. No accessory muscle use. EXTR: No c/c/e NEURO Normal gait.  PSYCH: Normally interactive. Conversant. Not depressed or anxious appearing.  Calm demeanor.   At the Achilles tendon on the right side, there are some postoperative changes, but his Achilles appears intact clinically.  And his foot does easily go downward with squeezing of his calf muscle.  Strength is intact throughout.  He is nontender throughout.  Laboratory and Imaging Data:  Assessment and Plan:     Oropharyngeal lesion - Plan: Ambulatory referral to ENT  Ex-user of chewing tobacco - Plan: Ambulatory referral to ENT  Ex-smoker - Plan: Ambulatory referral to ENT  S/P Achilles tendon repair  Generalized anxiety disorder  He is quite high risk for oropharyngeal cancer with a heavy use of DIP, chewing tobacco, smoking, and heavy alcohol use lifetime.  I am good to  have him see Dr. Tami Ribas for additional evaluation, and I appreciate his help.  I have no concerns regarding his Achilles, it is intact, and nontender currently.  Follow-up: No follow-ups on file.  Meds ordered this encounter  Medications  . LORazepam (ATIVAN) 0.5 MG tablet    Sig: Take 1 tablet (0.5 mg total) by mouth every 8 (eight) hours as needed for anxiety.    Dispense:  30 tablet    Refill:  1   Medications Discontinued During This Encounter  Medication Reason  . LORazepam (ATIVAN) 0.5 MG tablet Reorder   Orders Placed This Encounter  Procedures  . Ambulatory referral to ENT    Signed,  Frederico Hamman T. Antonio Creswell, MD   Allergies as of 06/30/2017   No Known Allergies     Medication List        Accurate as of 06/30/17 11:59 PM. Always use your most recent med list.          aspirin EC 81 MG tablet Take 81 mg by mouth daily.   LORazepam 0.5 MG tablet Commonly known as:  ATIVAN Take 1 tablet (0.5 mg total) by mouth every 8 (eight) hours as needed for anxiety.   multivitamin with minerals tablet Take 1 tablet by mouth daily.   Omega 3 1200 MG Caps   ranitidine 150 MG tablet Commonly known as:  ZANTAC Take 150 mg by mouth daily.   sildenafil 20 MG tablet Commonly known as:  REVATIO Generic Revatio / Sildanefil 20 mg. 1 - 5 tabs 30 mins prior to intercourse.   SUDAFED PO Take 1 tablet by mouth daily as needed (allergies).   ZYRTEC ALLERGY 10 MG Caps Generic drug:  Cetirizine HCl

## 2017-07-01 ENCOUNTER — Encounter (INDEPENDENT_AMBULATORY_CARE_PROVIDER_SITE_OTHER): Payer: Self-pay

## 2017-07-01 ENCOUNTER — Telehealth: Payer: Self-pay | Admitting: *Deleted

## 2017-07-01 ENCOUNTER — Encounter: Payer: Self-pay | Admitting: Family Medicine

## 2017-07-01 NOTE — Telephone Encounter (Signed)
Copied from [place] [id number]. Topic: Referral - Status >> Jul 01, 2017  4:29 PM Valla Leaver wrote: Reason for CRM: Wife, Lovey Newcomer, calling to ask Dr. Lorelei Pont to be vigil on signing office notes to complete the referral for her husband to ENT? Please advise.  >> Jul 01, 2017  4:50 PM Helene Shoe, LPN wrote: Vita Barley to  Me office visit on 06/30/17 is signed.do you agree?

## 2017-07-01 NOTE — Telephone Encounter (Signed)
Office note has been completed and signed by Dr. Lorelei Pont as of this morning.  Mr. Ranieri notified of this via [place].

## 2017-07-02 DIAGNOSIS — L818 Other specified disorders of pigmentation: Secondary | ICD-10-CM | POA: Diagnosis not present

## 2017-07-02 DIAGNOSIS — L723 Sebaceous cyst: Secondary | ICD-10-CM | POA: Diagnosis not present

## 2017-10-29 ENCOUNTER — Telehealth: Payer: Self-pay | Admitting: Family Medicine

## 2017-10-29 DIAGNOSIS — E7800 Pure hypercholesterolemia, unspecified: Secondary | ICD-10-CM

## 2017-10-29 DIAGNOSIS — E119 Type 2 diabetes mellitus without complications: Secondary | ICD-10-CM

## 2017-10-29 DIAGNOSIS — Z125 Encounter for screening for malignant neoplasm of prostate: Secondary | ICD-10-CM

## 2017-10-29 NOTE — Telephone Encounter (Signed)
-----   Message from Lendon Collar, RT sent at 10/26/2017  9:50 AM EDT ----- Regarding: Lab orders for Thursday 11/04/17 Please enter CPE lab orders for 11/04/17. Thanks!

## 2017-11-04 ENCOUNTER — Other Ambulatory Visit: Payer: BLUE CROSS/BLUE SHIELD

## 2017-11-11 ENCOUNTER — Encounter: Payer: BLUE CROSS/BLUE SHIELD | Admitting: Family Medicine

## 2017-12-20 ENCOUNTER — Other Ambulatory Visit (INDEPENDENT_AMBULATORY_CARE_PROVIDER_SITE_OTHER): Payer: 59

## 2017-12-20 ENCOUNTER — Other Ambulatory Visit: Payer: BLUE CROSS/BLUE SHIELD

## 2017-12-20 DIAGNOSIS — Z125 Encounter for screening for malignant neoplasm of prostate: Secondary | ICD-10-CM

## 2017-12-20 DIAGNOSIS — E119 Type 2 diabetes mellitus without complications: Secondary | ICD-10-CM | POA: Diagnosis not present

## 2017-12-20 DIAGNOSIS — Z0000 Encounter for general adult medical examination without abnormal findings: Secondary | ICD-10-CM | POA: Diagnosis not present

## 2017-12-20 LAB — COMPREHENSIVE METABOLIC PANEL
ALT: 23 U/L (ref 0–53)
AST: 16 U/L (ref 0–37)
Albumin: 4.2 g/dL (ref 3.5–5.2)
Alkaline Phosphatase: 61 U/L (ref 39–117)
BUN: 13 mg/dL (ref 6–23)
CO2: 30 mEq/L (ref 19–32)
CREATININE: 0.86 mg/dL (ref 0.40–1.50)
Calcium: 9.7 mg/dL (ref 8.4–10.5)
Chloride: 102 mEq/L (ref 96–112)
GFR: 95.18 mL/min (ref >60.00)
Glucose, Bld: 120 mg/dL — ABNORMAL HIGH (ref 70–99)
Potassium: 4.1 mEq/L (ref 3.5–5.1)
Sodium: 140 mEq/L (ref 135–145)
Total Bilirubin: 0.7 mg/dL (ref 0.2–1.2)
Total Protein: 6.9 g/dL (ref 6.0–8.3)

## 2017-12-20 LAB — LIPID PANEL
Cholesterol: 230 mg/dL — ABNORMAL HIGH (ref 0–200)
HDL: 46 mg/dL (ref >39.00)
LDL Cholesterol: 155 mg/dL — ABNORMAL HIGH (ref 0–99)
NonHDL: 184.3
Total CHOL/HDL Ratio: 5
Triglycerides: 145 mg/dL (ref 0.0–149.0)
VLDL: 29 mg/dL (ref 0.0–40.0)

## 2017-12-20 LAB — PSA: PSA: 1.62 ng/mL (ref 0.10–4.00)

## 2017-12-20 LAB — HEMOGLOBIN A1C: Hgb A1c MFr Bld: 6.2 % (ref 4.6–6.5)

## 2017-12-20 NOTE — Progress Notes (Signed)
Dr. Frederico Hamman T. Krishika Bugge, MD, [facility] [place], [address] Phone: [phone] Fax: [phone]  12/23/2017  Patient: Charles [name], MRN: [id number], DOB: 1956/01/02, 62 y.o.  Primary Physician:  Owens Loffler, MD   Chief Complaint  Patient presents with  . Annual Exam   Subjective:   Charles [name] is a 62 y.o. pleasant patient who presents with the following:  Preventative Health Maintenance Visit:  Health Maintenance Summary Reviewed and updated, unless pt declines services.  Tobacco History Reviewed. Alcohol: No concerns, no excessive use Exercise Habits: Some activity, rec at least 30 mins 5 times a week STD concerns: no risk or activity to increase risk Drug Use: None Encouraged self-testicular check  Colon cancer screening - [facility], approx 8 years ago. Foot exam -  Flu vaccine - [facility] exam - went about a year ago  Wife going to retire soon.  Diabetes Mellitus: Tolerating Medications: diet only now Compliance with diet: fair Exercise: minimal / intermittent Avg blood sugars at home: not checking Foot problems: none Hypoglycemia: none No nausea, vomitting, blurred vision, polyuria.  Lab Results  Component Value Date   HGBA1C 6.2 12/20/2017   HGBA1C 6.1 04/19/2017   HGBA1C 6.6 (H) 10/22/2016   Lab Results  Component Value Date   MICROALBUR 0.6 04/29/2017   LDLCALC 155 (H) 12/20/2017   CREATININE 0.86 12/20/2017    Wt Readings from Last 3 Encounters:  12/23/17 216 lb 4 oz (98.1 kg)  06/30/17 214 lb 8 oz (97.3 kg)  05/25/17 218 lb (98.9 kg)    Body mass index is 31.48 kg/m.   Health Maintenance  Topic Date Due  . FOOT EXAM  01/18/1964  . COLONOSCOPY  01/18/2004  . OPHTHALMOLOGY EXAM  09/11/2017  . URINE MICROALBUMIN  04/30/2018  . HEMOGLOBIN A1C  06/21/2018  . TETANUS/TDAP  02/26/2026  . INFLUENZA VACCINE  Completed  . PNEUMOCOCCAL POLYSACCHARIDE VACCINE AGE  62-64 HIGH RISK  Completed  . Hepatitis C Screening  Completed  . HIV Screening  Completed   Immunization History  Administered Date(s) Administered  . Influenza,inj,Quad PF,6+ Mos 10/18/2014, 10/26/2016, 11/19/2017  . Pneumococcal Polysaccharide-23 10/26/2016  . Tdap 02/27/2016   Patient Active Problem List   Diagnosis Date Noted  . Status post total replacement of right hip 12/07/2014  . Hyperlipidemia 10/21/2014  . Generalized anxiety disorder 10/21/2014  . GERD (gastroesophageal reflux disease)   . Controlled type 2 diabetes mellitus without complication, without long-term current use of insulin ([place])   . S/P Achilles tendon repair 01/06/2000   Past Medical History:  Diagnosis Date  . Anxiety   . Borderline diabetes   . Controlled type 2 diabetes mellitus without complication, without long-term current use of insulin ([place])   . Diverticulitis   . Generalized anxiety disorder 10/21/2014  . GERD (gastroesophageal reflux disease)    Past Surgical History:  Procedure Laterality Date  . ACHILLES TENDON REPAIR  2001  . BICEPS TENDON REPAIR Left 2014  . colonscopy     . HEMORROIDECTOMY  2012  . ROTATOR CUFF REPAIR Left 2014  . TOTAL HIP ARTHROPLASTY Right 12/07/2014   Procedure: RIGHT TOTAL HIP ARTHROPLASTY ANTERIOR APPROACH;  Surgeon: Mcarthur Rossetti, MD;  Location: [facility];  Service: Orthopedics;  Laterality: Right;   Social History   Socioeconomic History  . Marital status: Married    Spouse name: Not on file  . Number of children: Not on file  .  Years of education: Not on file  . Highest education level: Not on file  Occupational History  . Occupation: insurance adj  Social Needs  . Financial resource strain: Not on file  . Food insecurity:    Worry: Not on file    Inability: Not on file  . Transportation needs:    Medical: Not on file    Non-medical: Not on file  Tobacco Use  . Smoking status: Former Smoker    Packs/day: 0.50    Years: 20.00    Pack  years: 10.00    Types: Cigarettes    Last attempt to quit: 01/06/2000    Years since quitting: 17.9  . Smokeless tobacco: Former Systems developer    Types: [place] date: 01/06/2004  Substance and Sexual Activity  . Alcohol use: No    Alcohol/week: 0.0 standard drinks    Comment: occ/ heavy drinker in past   . Drug use: No  . Sexual activity: Yes    Partners: Female  Lifestyle  . Physical activity:    Days per week: Not on file    Minutes per session: Not on file  . Stress: Not on file  Relationships  . Social connections:    Talks on phone: Not on file    Gets together: Not on file    Attends religious service: Not on file    Active member of club or organization: Not on file    Attends meetings of clubs or organizations: Not on file    Relationship status: Not on file  . Intimate partner violence:    Fear of current or ex partner: Not on file    Emotionally abused: Not on file    Physically abused: Not on file    Forced sexual activity: Not on file  Other Topics Concern  . Not on file  Social History Narrative   Married, Arts administrator baseball player   Avid golfer   Family History  Problem Relation Age of Onset  . Alcohol abuse Father   . Heart attack Father   . Hyperlipidemia Father   . Stroke Father   . Hypertension Father   . Breast cancer Mother   . Hypertension Mother    No Known Allergies  Medication list has been reviewed and updated.   General: Denies fever, chills, sweats. No significant weight loss. Eyes: Denies blurring,significant itching ENT: Denies earache, sore throat, and hoarseness. Cardiovascular: Denies chest pains, palpitations, dyspnea on exertion Respiratory: Denies cough, dyspnea at rest,wheeezing Breast: no concerns about lumps GI: Denies nausea, vomiting, diarrhea, constipation, change in bowel habits, abdominal pain, melena, hematochezia GU: Denies penile discharge, ED, urinary flow / outflow problems. No STD  concerns. Musculoskeletal: Denies back pain, joint pain Derm: Denies rash, itching Neuro: Denies  paresthesias, frequent falls, frequent headaches Psych: Denies depression, anxiety Endocrine: Denies cold intolerance, heat intolerance, polydipsia Heme: Denies enlarged lymph nodes Allergy: No hayfever  Objective:   BP 110/62   Pulse 69   Temp 98.3 F (36.8 C) (Oral)   Ht 5' 9.5" (1.765 m)   Wt 216 lb 4 oz (98.1 kg)   BMI 31.48 kg/m  Ideal Body Weight: Weight in (lb) to have BMI = 25: 171.4  No exam data present  GEN: well developed, well nourished, no acute distress Eyes: conjunctiva and lids normal, PERRLA, EOMI ENT: TM clear, nares clear, oral exam WNL Neck: supple, no lymphadenopathy, no thyromegaly, no JVD Pulm: clear to auscultation and percussion, respiratory  effort normal CV: regular rate and rhythm, S1-S2, no murmur, rub or gallop, no bruits, peripheral pulses normal and symmetric, no cyanosis, clubbing, edema or varicosities GI: soft, non-tender; no hepatosplenomegaly, masses; active bowel sounds all quadrants GU: no hernia, testicular mass, penile discharge Lymph: no cervical, axillary or inguinal adenopathy MSK: gait normal, muscle tone and strength WNL, no joint swelling, effusions, discoloration, crepitus  SKIN: clear, good turgor, color WNL, no rashes, lesions, or ulcerations Neuro: normal mental status, normal strength, sensation, and motion Psych: alert; oriented to person, place and time, normally interactive and not anxious or depressed in appearance. All labs reviewed with patient.  Lipids: Lab Results  Component Value Date   CHOL 230 (H) 12/20/2017   Lab Results  Component Value Date   HDL 46.00 12/20/2017   Lab Results  Component Value Date   LDLCALC 155 (H) 12/20/2017   Lab Results  Component Value Date   TRIG 145.0 12/20/2017   Lab Results  Component Value Date   CHOLHDL 5 12/20/2017   CBC: CBC Latest Ref Rng & Units 04/19/2017  10/21/2016 12/08/2014  WBC 4.0 - 10.5 K/uL 6.6 5.5 10.4  Hemoglobin 13.0 - 17.0 g/dL 15.3 15.7 11.9(L)  Hematocrit 39.0 - 52.0 % 45.4 46.0 35.9(L)  Platelets 150.0 - 400.0 K/uL 230.0 225.0 364    Basic Metabolic Panel:    Component Value Date/Time   NA 140 12/20/2017 0814   K 4.1 12/20/2017 0814   CL 102 12/20/2017 0814   CO2 30 12/20/2017 0814   BUN 13 12/20/2017 0814   CREATININE 0.86 12/20/2017 0814   GLUCOSE 120 (H) 12/20/2017 0814   CALCIUM 9.7 12/20/2017 0814   Hepatic Function Latest Ref Rng & Units 12/20/2017 04/19/2017 10/21/2016  Total Protein 6.0 - 8.3 g/dL 6.9 7.0 7.1  Albumin 3.5 - 5.2 g/dL 4.2 4.2 4.2  AST 0 - 37 U/L _0 ALT 0 - 53 U/L _1 Alk Phosphatase 39 - 117 U/L 61 67 72  Total Bilirubin 0.2 - 1.2 mg/dL 0.7 0.7 0.5  Bilirubin, Direct 0.0 - 0.3 mg/dL - 0.1 0.1    No results found for: TSH Lab Results  Component Value Date   PSA 1.62 12/20/2017   PSA 1.68 04/19/2017   PSA 1.94 10/21/2016    Assessment and Plan:   Healthcare maintenance  Controlled type 2 diabetes mellitus without complication, without long-term current use of insulin ([place])  Doing well overall.  Declines statin - will recheck chol in 6 mos  Health Maintenance Exam: The patient's preventative maintenance and recommended screening tests for an annual wellness exam were reviewed in full today. Brought up to date unless services declined.  Counselled on the importance of diet, exercise, and its role in overall health and mortality. The patient's FH and SH was reviewed, including their home life, tobacco status, and drug and alcohol status.  Follow-up in 1 year for physical exam or additional follow-up below.  Follow-up: No follow-ups on file. Or follow-up in 1 year if not noted.  Signed,  Maud Deed. Deby Adger, MD   Allergies as of 12/23/2017   No Known Allergies     Medication List       Accurate as of December 23, 2017 10:04 AM. Always use your most recent med  list.        aspirin EC 81 MG tablet Take 81 mg by mouth daily.   LORazepam 0.5 MG tablet Commonly known as:  ATIVAN Take 1 tablet (0.5  mg total) by mouth every 8 (eight) hours as needed for anxiety.   multivitamin with minerals tablet Take 1 tablet by mouth daily.   Omega 3 1200 MG Caps   ranitidine 150 MG tablet Commonly known as:  ZANTAC Take 150 mg by mouth daily.   sildenafil 20 MG tablet Commonly known as:  REVATIO Generic Revatio / Sildanefil 20 mg. 1 - 5 tabs 30 mins prior to intercourse.   SUDAFED PO Take 1 tablet by mouth daily as needed (allergies).   ZYRTEC ALLERGY 10 MG Caps Generic drug:  Cetirizine HCl

## 2017-12-23 ENCOUNTER — Encounter: Payer: BLUE CROSS/BLUE SHIELD | Admitting: Family Medicine

## 2017-12-23 ENCOUNTER — Ambulatory Visit (INDEPENDENT_AMBULATORY_CARE_PROVIDER_SITE_OTHER): Payer: 59 | Admitting: Family Medicine

## 2017-12-23 ENCOUNTER — Encounter: Payer: Self-pay | Admitting: Family Medicine

## 2017-12-23 VITALS — BP 110/62 | HR 69 | Temp 98.3°F | Ht 69.5 in | Wt 216.2 lb

## 2017-12-23 DIAGNOSIS — E119 Type 2 diabetes mellitus without complications: Secondary | ICD-10-CM

## 2017-12-23 DIAGNOSIS — Z0000 Encounter for general adult medical examination without abnormal findings: Secondary | ICD-10-CM | POA: Diagnosis not present

## 2017-12-23 MED ORDER — SILDENAFIL CITRATE 20 MG PO TABS: ORAL_TABLET | ORAL | 11 refills | Status: DC

## 2018-01-24 ENCOUNTER — Telehealth: Payer: Self-pay | Admitting: Family Medicine

## 2018-01-24 NOTE — Telephone Encounter (Signed)
Pt's wife Katharine Look (on dpr) called office due to being charged for labs the pt had for the physical. She wanted to know how it was billed.

## 2018-01-25 NOTE — Telephone Encounter (Signed)
I have sent this over to coder and I will follow up with pt when I get answers.

## 2018-01-26 NOTE — Telephone Encounter (Signed)
I received a msg from [employer] that charges were filed with wrong dx code. She has sent to have corrected- I spoke to pt and he is aware.

## 2018-01-26 NOTE — Telephone Encounter (Signed)
This is the note I received from coding, pt is aware  I have sent email to charge correction to change the lipid and the bmet to Z00.00. however, since he has DM2, I can not change the Glycosated hemoglobin dx.

## 2018-06-21 ENCOUNTER — Other Ambulatory Visit: Payer: 59

## 2018-06-23 ENCOUNTER — Ambulatory Visit: Payer: 59 | Admitting: Family Medicine

## 2018-06-23 ENCOUNTER — Encounter: Payer: Self-pay | Admitting: Family Medicine

## 2018-06-23 ENCOUNTER — Ambulatory Visit (INDEPENDENT_AMBULATORY_CARE_PROVIDER_SITE_OTHER): Payer: BC Managed Care – PPO | Admitting: Family Medicine

## 2018-06-23 ENCOUNTER — Other Ambulatory Visit: Payer: Self-pay | Admitting: *Deleted

## 2018-06-23 DIAGNOSIS — M5441 Lumbago with sciatica, right side: Secondary | ICD-10-CM | POA: Diagnosis not present

## 2018-06-23 MED ORDER — PREDNISONE 20 MG PO TABS: ORAL_TABLET | ORAL | 0 refills | Status: DC

## 2018-06-23 MED ORDER — CYCLOBENZAPRINE HCL 10 MG PO TABS: 10.0000 mg | ORAL_TABLET | Freq: Every evening | ORAL | 0 refills | Status: DC | PRN

## 2018-06-23 NOTE — Progress Notes (Signed)
Refill request for Lorazepam sent to Dr. Lorelei Pont as instructed by Dr. Diona Browner.

## 2018-06-23 NOTE — Progress Notes (Signed)
VIRTUAL VISIT Due to national recommendations of social distancing due to [place] 19, a virtual visit is felt to be most appropriate for this patient at this time.   I connected with the patient on 06/23/18 at 10:20 AM EDT by virtual telehealth platform and verified that I am speaking with the correct person using two identifiers.   I discussed the limitations, risks, security and privacy concerns of performing an evaluation and management service by  virtual telehealth platform and the availability of in person appointments. I also discussed with the patient that there may be a patient responsible charge related to this service. The patient expressed understanding and agreed to proceed.  Patient location: Home Provider Location: [facility] Participants: Charles [name] and Valere Dross   Chief Complaint  Patient presents with  . Back Pain    History of Present Illness: 63 year old male patient of Dr. Lillie Fragmin with history of DM, S/P THR present for back pain.   He reports 2 months of left low back pain. Treated with ibuprofen 600 mg 2-3 times daily. No radiation of pain, no radiation of pain.  Stopped golf for 5 weeks.  Last night when bent over had sharp pain in right  low back ..  has radiation of pain. To right thigh Took ibuprofen again.  Pain with moving in general. Pain going up steps.  Able to sleep at night  No numbness, no weakness.  no incontinence, no fever, no dysuria..   No falls, no known injuries.   No past back surgeries.  Some off and low back issues.   COVID 19 screen No recent travel or known exposure to COVID19 The patient denies respiratory symptoms of COVID 19 at this time.  The importance of social distancing was discussed today.   Review of Systems  Constitutional: Negative for chills and fever.  HENT: Negative for congestion and ear pain.   Eyes: Negative for pain and redness.  Respiratory: Negative for cough and shortness of breath.    Cardiovascular: Negative for chest pain, palpitations and leg swelling.  Gastrointestinal: Negative for abdominal pain, blood in stool, constipation, diarrhea, nausea and vomiting.  Genitourinary: Negative for dysuria.  Musculoskeletal: Positive for back pain. Negative for falls and myalgias.  Skin: Negative for rash.  Neurological: Negative for dizziness.  Psychiatric/Behavioral: Negative for depression. The patient is not nervous/anxious.       Past Medical History:  Diagnosis Date  . Anxiety   . Borderline diabetes   . Controlled type 2 diabetes mellitus without complication, without long-term current use of insulin ([place])   . Diverticulitis   . Generalized anxiety disorder 10/21/2014  . GERD (gastroesophageal reflux disease)     reports that he quit smoking about 18 years ago. His smoking use included cigarettes. He has a 10.00 pack-year smoking history. He quit smokeless tobacco use about 14 years ago.  His smokeless tobacco use included chew. He reports that he does not drink alcohol or use drugs.   Current Outpatient Medications:  .  aspirin EC 81 MG tablet, Take 81 mg by mouth daily., Disp: , Rfl:  .  Cetirizine HCl (ZYRTEC ALLERGY) 10 MG CAPS, , Disp: , Rfl:  .  LORazepam (ATIVAN) 0.5 MG tablet, Take 1 tablet (0.5 mg total) by mouth every 8 (eight) hours as needed for anxiety., Disp: 30 tablet, Rfl: 1 .  Multiple Vitamins-Minerals (MULTIVITAMIN WITH MINERALS) tablet, Take 1 tablet by mouth daily., Disp: , Rfl:  .  Omega 3 1200  MG CAPS, , Disp: , Rfl:  .  Pseudoephedrine HCl (SUDAFED PO), Take 1 tablet by mouth daily as needed (allergies). , Disp: , Rfl:  .  sildenafil (REVATIO) 20 MG tablet, Generic Revatio / Sildanefil 20 mg. 1 - 5 tabs 30 mins prior to intercourse., Disp: 20 tablet, Rfl: 11   Observations/Objective: Blood pressure 128/67, pulse 80, temperature 97.8 F (36.6 C), temperature source Oral, height 5' 9.5" (1.765 m), weight 215 lb (97.5 kg), SpO2 95  %.  Physical Exam  Physical Exam Constitutional:      General: The patient is not in acute distress. Pulmonary:     Effort: Pulmonary effort is normal. No respiratory distress.  Neurological:     Mental Status: The patient is alert and oriented to person, place, and time.  Psychiatric:        Mood and Affect: Mood normal.        Behavior: Behavior normal.  Wife .. palpated pt;'s back... ttp in central low back , greatest  On right, some sciatic notch pain Assessment and Plan    I discussed the assessment and treatment plan with the patient. The patient was provided an opportunity to ask questions and all were answered. The patient agreed with the plan and demonstrated an understanding of the instructions.   The patient was advised to call back or seek an in-person evaluation if the symptoms worsen or if the condition fails to improve as anticipated.  Acute back pain with sciatica, right Treat with  Muscle relaxant pred taper, home OPT, ehat and massage. Follow up if not improving in 2 weeks.     Charles Lofts, MD

## 2018-06-23 NOTE — Assessment & Plan Note (Signed)
Treat with  Muscle relaxant pred taper, home OPT, ehat and massage. Follow up if not improving in 2 weeks.

## 2018-06-23 NOTE — Patient Instructions (Addendum)
Start low  Back stretches.  Start prednisone taper.  Muscle relaxant at night.  Heat, and massage.  Follow up in 2 weeks with Dr. Lorelei Pont in office if not improving.   Back Exercises The following exercises strengthen the muscles that help to support the back. They also help to keep the lower back flexible. Doing these exercises can help to prevent back pain or lessen existing pain. If you have back pain or discomfort, try doing these exercises 2-3 times each day or as told by your health care provider. When the pain goes away, do them once each day, but increase the number of times that you repeat the steps for each exercise (do more repetitions). If you do not have back pain or discomfort, do these exercises once each day or as told by your health care provider. Exercises Single Knee to Chest Repeat these steps 3-5 times for each leg: 1. Lie on your back on a firm bed or the floor with your legs extended. 2. Bring one knee to your chest. Your other leg should stay extended and in contact with the floor. 3. Hold your knee in place by grabbing your knee or thigh. 4. Pull on your knee until you feel a gentle stretch in your lower back. 5. Hold the stretch for 10-30 seconds. 6. Slowly release and straighten your leg. Pelvic Tilt Repeat these steps 5-10 times: 1. Lie on your back on a firm bed or the floor with your legs extended. 2. Bend your knees so they are pointing toward the ceiling and your feet are flat on the floor. 3. Tighten your lower abdominal muscles to press your lower back against the floor. This motion will tilt your pelvis so your tailbone points up toward the ceiling instead of pointing to your feet or the floor. 4. With gentle tension and even breathing, hold this position for 5-10 seconds. Cat-Cow Repeat these steps until your lower back becomes more flexible: 1. Get into a hands-and-knees position on a firm surface. Keep your hands under your shoulders, and keep your  knees under your hips. You may place padding under your knees for comfort. 2. Let your head hang down, and point your tailbone toward the floor so your lower back becomes rounded like the back of a cat. 3. Hold this position for 5 seconds. 4. Slowly lift your head and point your tailbone up toward the ceiling so your back forms a sagging arch like the back of a cow. 5. Hold this position for 5 seconds.  Press-Ups Repeat these steps 5-10 times: 1. Lie on your abdomen (face-down) on the floor. 2. Place your palms near your head, about shoulder-width apart. 3. While you keep your back as relaxed as possible and keep your hips on the floor, slowly straighten your arms to raise the top half of your body and lift your shoulders. Do not use your back muscles to raise your upper torso. You may adjust the placement of your hands to make yourself more comfortable. 4. Hold this position for 5 seconds while you keep your back relaxed. 5. Slowly return to lying flat on the floor.  Bridges Repeat these steps 10 times: 1. Lie on your back on a firm surface. 2. Bend your knees so they are pointing toward the ceiling and your feet are flat on the floor. 3. Tighten your buttocks muscles and lift your buttocks off of the floor until your waist is at almost the same height as your knees. You should  feel the muscles working in your buttocks and the back of your thighs. If you do not feel these muscles, slide your feet 1-2 inches farther away from your buttocks. 4. Hold this position for 3-5 seconds. 5. Slowly lower your hips to the starting position, and allow your buttocks muscles to relax completely. If this exercise is too easy, try doing it with your arms crossed over your chest. Abdominal Crunches Repeat these steps 5-10 times: 1. Lie on your back on a firm bed or the floor with your legs extended. 2. Bend your knees so they are pointing toward the ceiling and your feet are flat on the floor. 3. Cross your  arms over your chest. 4. Tip your chin slightly toward your chest without bending your neck. 5. Tighten your abdominal muscles and slowly raise your trunk (torso) high enough to lift your shoulder blades a tiny bit off of the floor. Avoid raising your torso higher than that, because it can put too much stress on your low back and it does not help to strengthen your abdominal muscles. 6. Slowly return to your starting position. Back Lifts Repeat these steps 5-10 times: 1. Lie on your abdomen (face-down) with your arms at your sides, and rest your forehead on the floor. 2. Tighten the muscles in your legs and your buttocks. 3. Slowly lift your chest off of the floor while you keep your hips pressed to the floor. Keep the back of your head in line with the curve in your back. Your eyes should be looking at the floor. 4. Hold this position for 3-5 seconds. 5. Slowly return to your starting position. Contact a health care provider if:  Your back pain or discomfort gets much worse when you do an exercise.  Your back pain or discomfort does not lessen within 2 hours after you exercise. If you have any of these problems, stop doing these exercises right away. Do not do them again unless your health care provider says that you can. Get help right away if:  You develop sudden, severe back pain. If this happens, stop doing the exercises right away. Do not do them again unless your health care provider says that you can. This information is not intended to replace advice given to you by your health care provider. Make sure you discuss any questions you have with your health care provider. Document Released: 01/30/2004 Document Revised: 04/27/2017 Document Reviewed: 02/15/2014 Elsevier Interactive Patient Education  [employer].

## 2018-06-23 NOTE — Telephone Encounter (Signed)
Last office visit 06/23/2018 with Dr. Diona Browner for back pain.  Last refilled 06/30/2017 for #30 with 1 refill.  No future appointments.

## 2018-06-24 MED ORDER — LORAZEPAM 0.5 MG PO TABS: 0.5000 mg | ORAL_TABLET | Freq: Three times a day (TID) | ORAL | 1 refills | Status: DC | PRN

## 2018-09-29 ENCOUNTER — Other Ambulatory Visit: Payer: Self-pay

## 2018-09-29 DIAGNOSIS — R6889 Other general symptoms and signs: Secondary | ICD-10-CM | POA: Diagnosis not present

## 2018-09-29 DIAGNOSIS — Z20822 Contact with and (suspected) exposure to covid-19: Secondary | ICD-10-CM

## 2018-09-30 LAB — NOVEL CORONAVIRUS, NAA: SARS-CoV-2, NAA: NOT DETECTED

## 2018-12-20 DIAGNOSIS — H0288A Meibomian gland dysfunction right eye, upper and lower eyelids: Secondary | ICD-10-CM | POA: Diagnosis not present

## 2018-12-20 DIAGNOSIS — H00014 Hordeolum externum left upper eyelid: Secondary | ICD-10-CM | POA: Diagnosis not present

## 2018-12-20 DIAGNOSIS — H0288B Meibomian gland dysfunction left eye, upper and lower eyelids: Secondary | ICD-10-CM | POA: Diagnosis not present

## 2018-12-20 DIAGNOSIS — H0102B Squamous blepharitis left eye, upper and lower eyelids: Secondary | ICD-10-CM | POA: Diagnosis not present

## 2019-02-02 ENCOUNTER — Ambulatory Visit: Payer: BC Managed Care – PPO

## 2019-02-10 ENCOUNTER — Ambulatory Visit: Payer: Medicare HMO | Attending: Internal Medicine

## 2019-02-10 DIAGNOSIS — Z23 Encounter for immunization: Secondary | ICD-10-CM | POA: Insufficient documentation

## 2019-02-10 NOTE — Progress Notes (Signed)
   Covid-19 Vaccination Clinic  Name:  Charles [name]    MRN: [id number] DOB: [birth year]  02/10/2019  [name] was observed post Covid-19 immunization for 15 minutes without incidence. He was provided with Vaccine Information Sheet and instruction to access the V-Safe system.   [name] was instructed to call 911 with any severe reactions post vaccine: Marland Kitchen Difficulty breathing  . Swelling of your face and throat  . A fast heartbeat  . A bad rash all over your body  . Dizziness and weakness    Immunizations Administered    Name Date Dose VIS Date Route   Pfizer COVID-19 Vaccine 02/10/2019  4:42 PM 0.3 mL 12/16/2018 Intramuscular   Manufacturer: [place]   Lot: [id number]   [place]: [id number]

## 2019-03-07 ENCOUNTER — Ambulatory Visit: Payer: Medicare HMO | Attending: Internal Medicine

## 2019-03-07 DIAGNOSIS — Z23 Encounter for immunization: Secondary | ICD-10-CM

## 2019-03-07 NOTE — Progress Notes (Signed)
   Covid-19 Vaccination Clinic  Name:  Charles [name]    MRN: [id number] DOB: Jul 18, 1955  03/07/2019  Charles [name] was observed post Covid-19 immunization for 15 minutes without incident. He was provided with Vaccine Information Sheet and instruction to access the V-Safe system.   Charles [name] was instructed to call 911 with any severe reactions post vaccine: Marland Kitchen Difficulty breathing  . Swelling of face and throat  . A fast heartbeat  . A bad rash all over body  . Dizziness and weakness   Immunizations Administered    Name Date Dose VIS Date Route   Pfizer COVID-19 Vaccine 03/07/2019  3:58 PM 0.3 mL 12/16/2018 Intramuscular   Manufacturer: [place]   Lot: [id number]   [place]: [id number]

## 2019-08-07 ENCOUNTER — Other Ambulatory Visit: Payer: Self-pay | Admitting: Family Medicine

## 2019-08-07 NOTE — Telephone Encounter (Signed)
Last office visit 06/23/2018 with Dr. Diona Browner for back pain.  Last refilled 06/24/2018 for #30 with 1 refill.  No future appointments.  Refill?

## 2019-08-08 NOTE — Telephone Encounter (Signed)
Can you get Charles [name] to come into the office for a physical in the next month or so.  Labs 1 week before

## 2019-08-10 NOTE — Telephone Encounter (Signed)
Patient's family called in and scheduled.

## 2019-08-10 NOTE — Telephone Encounter (Signed)
Left message asking pt to call office  °

## 2019-08-28 ENCOUNTER — Other Ambulatory Visit (INDEPENDENT_AMBULATORY_CARE_PROVIDER_SITE_OTHER): Payer: Medicare HMO

## 2019-08-28 ENCOUNTER — Other Ambulatory Visit: Payer: Self-pay

## 2019-08-28 ENCOUNTER — Other Ambulatory Visit: Payer: Self-pay | Admitting: Family Medicine

## 2019-08-28 DIAGNOSIS — Z79899 Other long term (current) drug therapy: Secondary | ICD-10-CM | POA: Diagnosis not present

## 2019-08-28 DIAGNOSIS — N138 Other obstructive and reflux uropathy: Secondary | ICD-10-CM | POA: Diagnosis not present

## 2019-08-28 DIAGNOSIS — E119 Type 2 diabetes mellitus without complications: Secondary | ICD-10-CM | POA: Diagnosis not present

## 2019-08-28 DIAGNOSIS — E7800 Pure hypercholesterolemia, unspecified: Secondary | ICD-10-CM | POA: Diagnosis not present

## 2019-08-28 DIAGNOSIS — E785 Hyperlipidemia, unspecified: Secondary | ICD-10-CM

## 2019-08-28 DIAGNOSIS — N401 Enlarged prostate with lower urinary tract symptoms: Secondary | ICD-10-CM | POA: Diagnosis not present

## 2019-08-28 LAB — LIPID PANEL
Cholesterol: 209 mg/dL — ABNORMAL HIGH (ref 0–200)
HDL: 41.7 mg/dL (ref >39.00)
LDL Cholesterol: 136 mg/dL — ABNORMAL HIGH (ref 0–99)
NonHDL: 166.87
Total CHOL/HDL Ratio: 5
Triglycerides: 153 mg/dL — ABNORMAL HIGH (ref 0.0–149.0)
VLDL: 30.6 mg/dL (ref 0.0–40.0)

## 2019-08-28 LAB — CBC WITH DIFFERENTIAL/PLATELET
Basophils Absolute: 0 10*3/uL (ref 0.0–0.1)
Basophils Relative: 0.9 % (ref 0.0–3.0)
Eosinophils Absolute: 0.4 10*3/uL (ref 0.0–0.7)
Eosinophils Relative: 8.4 % — ABNORMAL HIGH (ref 0.0–5.0)
HCT: 43.2 % (ref 39.0–52.0)
Hemoglobin: 14.7 g/dL (ref 13.0–17.0)
Lymphocytes Relative: 23.8 % (ref 12.0–46.0)
Lymphs Abs: 1.2 10*3/uL (ref 0.7–4.0)
MCHC: 34.1 g/dL (ref 30.0–36.0)
MCV: 91.5 fl (ref 78.0–100.0)
Monocytes Absolute: 0.6 10*3/uL (ref 0.1–1.0)
Monocytes Relative: 11.6 % (ref 3.0–12.0)
Neutro Abs: 2.8 10*3/uL (ref 1.4–7.7)
Neutrophils Relative %: 55.3 % (ref 43.0–77.0)
Platelets: 195 10*3/uL (ref 150.0–400.0)
RBC: 4.72 Mil/uL (ref 4.22–5.81)
RDW: 13.1 % (ref 11.5–15.5)
WBC: 5 10*3/uL (ref 4.0–10.5)

## 2019-08-28 LAB — HEPATIC FUNCTION PANEL
ALT: 31 U/L (ref 0–53)
AST: 21 U/L (ref 0–37)
Albumin: 4.1 g/dL (ref 3.5–5.2)
Alkaline Phosphatase: 76 U/L (ref 39–117)
Bilirubin, Direct: 0.1 mg/dL (ref 0.0–0.3)
Total Bilirubin: 0.7 mg/dL (ref 0.2–1.2)
Total Protein: 6.8 g/dL (ref 6.0–8.3)

## 2019-08-28 LAB — BASIC METABOLIC PANEL
BUN: 13 mg/dL (ref 6–23)
CO2: 29 mEq/L (ref 19–32)
Calcium: 9.2 mg/dL (ref 8.4–10.5)
Chloride: 102 mEq/L (ref 96–112)
Creatinine, Ser: 0.93 mg/dL (ref 0.40–1.50)
GFR: 81.39 mL/min (ref >60.00)
Glucose, Bld: 126 mg/dL — ABNORMAL HIGH (ref 70–99)
Potassium: 4.2 mEq/L (ref 3.5–5.1)
Sodium: 139 mEq/L (ref 135–145)

## 2019-08-28 LAB — HEMOGLOBIN A1C: Hgb A1c MFr Bld: 6.4 % (ref 4.6–6.5)

## 2019-08-28 LAB — MICROALBUMIN / CREATININE URINE RATIO
Creatinine,U: 137.6 mg/dL
Microalb Creat Ratio: 0.5 mg/g (ref 0.0–30.0)
Microalb, Ur: <0.7 mg/dL (ref 0.0–1.9)

## 2019-08-28 LAB — PSA: PSA: 1.65 ng/mL (ref 0.10–4.00)

## 2019-08-29 DIAGNOSIS — H0288B Meibomian gland dysfunction left eye, upper and lower eyelids: Secondary | ICD-10-CM | POA: Diagnosis not present

## 2019-08-29 DIAGNOSIS — H0288A Meibomian gland dysfunction right eye, upper and lower eyelids: Secondary | ICD-10-CM | POA: Diagnosis not present

## 2019-08-29 DIAGNOSIS — H04123 Dry eye syndrome of bilateral lacrimal glands: Secondary | ICD-10-CM | POA: Diagnosis not present

## 2019-08-29 DIAGNOSIS — H524 Presbyopia: Secondary | ICD-10-CM | POA: Diagnosis not present

## 2019-08-29 DIAGNOSIS — H52212 Irregular astigmatism, left eye: Secondary | ICD-10-CM | POA: Diagnosis not present

## 2019-08-31 ENCOUNTER — Ambulatory Visit (INDEPENDENT_AMBULATORY_CARE_PROVIDER_SITE_OTHER): Payer: Medicare HMO | Admitting: Family Medicine

## 2019-08-31 ENCOUNTER — Encounter: Payer: Self-pay | Admitting: Family Medicine

## 2019-08-31 ENCOUNTER — Other Ambulatory Visit: Payer: Self-pay

## 2019-08-31 VITALS — BP 142/70 | HR 67 | Temp 98.0°F | Ht 69.5 in | Wt 223.0 lb

## 2019-08-31 DIAGNOSIS — E785 Hyperlipidemia, unspecified: Secondary | ICD-10-CM | POA: Diagnosis not present

## 2019-08-31 DIAGNOSIS — Z0000 Encounter for general adult medical examination without abnormal findings: Secondary | ICD-10-CM | POA: Diagnosis not present

## 2019-08-31 DIAGNOSIS — E119 Type 2 diabetes mellitus without complications: Secondary | ICD-10-CM

## 2019-08-31 MED ORDER — LORAZEPAM 0.5 MG PO TABS: 0.5000 mg | ORAL_TABLET | Freq: Three times a day (TID) | ORAL | 0 refills | Status: DC | PRN

## 2019-08-31 MED ORDER — SILDENAFIL CITRATE 100 MG PO TABS: 50.0000 mg | ORAL_TABLET | Freq: Every day | ORAL | 11 refills | Status: DC | PRN

## 2019-08-31 NOTE — Telephone Encounter (Signed)
Patient says he forgot top request a Rx while here at his visit wanting to know if this can be sent to pharmacy. Wanting a phone call when sent in    LORazepam (ATIVAN) 0.5 MG tablet   [id number] - [place], [place] [address]

## 2019-08-31 NOTE — Patient Instructions (Signed)
Prevnar-13 vaccine  Check with your insurance to see if they will cover the shingles shot.  The newer [facility] shot is much better than the older shot. The [facility] shot requires 2 shots given 6 months apart.  always, this shot is not covered in our office by your insurance. It costs 500 dollars, but essentially all insurances cover it at your pharmacy.  I would call your insurance number on your card to confirm this.

## 2019-08-31 NOTE — Telephone Encounter (Signed)
Last office visit today for CPE.  Last refilled 08/08/2019 for #30 with no refills.

## 2019-08-31 NOTE — Progress Notes (Addendum)
Kohana Amble T. Dasan Hardman, MD, [place] at [facility] [place], [address]  Phone: [phone]  FAX: [phone]  Charles [name] - 64 y.o. male  MRN [id number]  Date of Birth: 1955/09/01  Date: 08/31/2019  PCP: Owens Loffler, MD  Referral: Owens Loffler, MD  Chief Complaint  Patient presents with  . Welcome to Medicare    This visit occurred during the SARS-CoV-2 public health emergency.  Safety protocols were in place, including screening questions prior to the visit, additional usage of staff PPE, and extensive cleaning of exam room while observing appropriate contact time as indicated for disinfecting solutions.   Patient Care Team: Owens Loffler, MD as PCP - General (Family Medicine) Subjective:   Charles [name] is a 64 y.o. pleasant patient who presents with the following:  Welcome to [facility] exam  Health Maintenance Summary Reviewed and updated, unless pt declines services.  Tobacco History Reviewed. Alcohol: No concerns, no excessive use Exercise Habits: Some activity, rec at least 30 mins 5 times a week STD concerns: no risk or activity to increase risk Drug Use: None  HM: Prevnar-13 Flu He will get both at the pharmacy  Foot Eye exam - yesterday Colonscopy after 12/2019  Diabetes Mellitus: Tolerating Medications: Diet controlled Compliance with diet: fair, Body mass index is 32.46 kg/m. -He does eat some sweets sometimes Exercise: minimal / intermittent Avg blood sugars at home: not checking Foot problems: none Hypoglycemia: none No nausea, vomitting, blurred vision, polyuria.  Lab Results  Component Value Date   HGBA1C 6.4 08/28/2019   HGBA1C 6.2 12/20/2017   HGBA1C 6.1 04/19/2017   Lab Results  Component Value Date   MICROALBUR <0.7 08/28/2019   LDLCALC 136 (H) 08/28/2019   CREATININE 0.93 08/28/2019    Wt Readings from Last 3  Encounters:  08/31/19 223 lb (101.2 kg)  06/23/18 215 lb (97.5 kg)  12/23/17 216 lb 4 oz (98.1 kg)     MOved to [employer] well healthwise. Eats late at night sometimes.   Health Maintenance  Topic Date Due  . INFLUENZA VACCINE  08/06/2019  . COLONOSCOPY  12/06/2019  . HEMOGLOBIN A1C  02/28/2020  . URINE MICROALBUMIN  08/27/2020  . FOOT EXAM  08/30/2020  . OPHTHALMOLOGY EXAM  08/30/2020  . PNA vac Low Risk Adult (2 of 2 - PCV13) 09/07/2020  . TETANUS/TDAP  02/26/2026  . COVID-19 Vaccine  Completed  . Hepatitis C Screening  Completed  . HIV Screening  Completed   Immunization History  Administered Date(s) Administered  . Influenza,inj,Quad PF,6+ Mos 10/18/2014, 10/26/2016, 11/19/2017, 09/20/2018  . PFIZER SARS-COV-2 Vaccination 02/10/2019, 03/07/2019  . Pneumococcal Polysaccharide-23 10/26/2016  . Tdap 02/27/2016   Patient Active Problem List   Diagnosis Date Noted  . Status post total replacement of right hip 12/07/2014  . Hyperlipidemia 10/21/2014  . Generalized anxiety disorder 10/21/2014  . GERD (gastroesophageal reflux disease)   . Controlled type 2 diabetes mellitus without complication, without long-term current use of insulin ([place])   . S/P Achilles tendon repair 01/06/2000    Past Medical History:  Diagnosis Date  . Anxiety   . Controlled type 2 diabetes mellitus without complication, without long-term current use of insulin ([place])   . Diverticulitis   . Generalized anxiety disorder 10/21/2014  . GERD (gastroesophageal reflux disease)     Past Surgical History:  Procedure Laterality Date  . ACHILLES TENDON REPAIR  2001  .  BICEPS TENDON REPAIR Left 2014  . colonscopy     . HEMORROIDECTOMY  2012  . ROTATOR CUFF REPAIR Left 2014  . TOTAL HIP ARTHROPLASTY Right 12/07/2014   Procedure: RIGHT TOTAL HIP ARTHROPLASTY ANTERIOR APPROACH;  Surgeon: Mcarthur Rossetti, MD;  Location: [facility];  Service: Orthopedics;  Laterality: Right;    Family  History  Problem Relation Age of Onset  . Alcohol abuse Father   . Heart attack Father   . Hyperlipidemia Father   . Stroke Father   . Hypertension Father   . Breast cancer Mother   . Hypertension Mother     Past Medical History, Surgical History, Social History, Family History, Problem List, Medications, and Allergies have been reviewed and updated if relevant.  Review of Systems: Pertinent positives are listed above.  Otherwise, a full 14 point review of systems has been done in full and it is negative except where it is noted positive.  Objective:   BP (!) 142/70   Pulse 67   Temp 98 F (36.7 C) (Temporal)   Ht 5' 9.5" (1.765 m)   Wt 223 lb (101.2 kg)   SpO2 97%   BMI 32.46 kg/m  Ideal Body Weight: Weight in (lb) to have BMI = 25: 171.4  Ideal Body Weight: Weight in (lb) to have BMI = 25: 171.4  Hearing Screening   125Hz  250Hz  500Hz  1000Hz  2000Hz  3000Hz  4000Hz  6000Hz  8000Hz   Right ear:   20 20 20  20     Left ear:   20 25 20  20       Visual Acuity Screening   Right eye Left eye Both eyes  Without correction: 20/13 20/13 20/13   With correction:      Depression screen [facility] 2/9 08/31/2019 12/23/2017 07/15/2016  Decreased Interest 0 0 2  Down, Depressed, Hopeless 0 0 2  PHQ - 2 Score 0 0 4  Altered sleeping - - 3  Tired, decreased energy - - 3  Change in appetite - - 3  Feeling bad or failure about yourself  - - 3  Trouble concentrating - - 3  Moving slowly or fidgety/restless - - 3  Suicidal thoughts - - 1  PHQ-9 Score - - 23     GEN: well developed, well nourished, no acute distress Eyes: conjunctiva and lids normal, PERRLA, EOMI ENT: TM clear, nares clear, oral exam WNL Neck: supple, no lymphadenopathy, no thyromegaly, no JVD Pulm: clear to auscultation and percussion, respiratory effort normal CV: regular rate and rhythm, S1-S2, no murmur, rub or gallop, no bruits, peripheral pulses normal and symmetric, no cyanosis, clubbing, edema or varicosities GI: soft,  non-tender; no hepatosplenomegaly, masses; active bowel sounds all quadrants GU: no hernia, testicular mass, penile discharge Lymph: no cervical, axillary or inguinal adenopathy MSK: gait normal, muscle tone and strength WNL, no joint swelling, effusions, discoloration, crepitus  SKIN: clear, good turgor, color WNL, no rashes, lesions, or ulcerations Neuro: normal mental status, normal strength, sensation, and motion Psych: alert; oriented to person, place and time, normally interactive and not anxious or depressed in appearance.  All labs reviewed with patient. Results for orders placed or performed in visit on 08/28/19  Lipid panel  Result Value Ref Range   Cholesterol 209 (H) 0 - 200 mg/dL   Triglycerides 153.0 (H) 0 - 149 mg/dL   HDL 41.70 >39.00 mg/dL   VLDL 30.6 0.0 - 40.0 mg/dL   LDL Cholesterol 136 (H) 0 - 99 mg/dL   Total CHOL/HDL Ratio 5  NonHDL 166.87   Hemoglobin A1c  Result Value Ref Range   Hgb A1c MFr Bld 6.4 4.6 - 6.5 %  Microalbumin / creatinine urine ratio  Result Value Ref Range   Microalb, Ur <0.7 0.0 - 1.9 mg/dL   Creatinine,U 137.6 mg/dL   Microalb Creat Ratio 0.5 0.0 - 30.0 mg/g  Hepatic function panel  Result Value Ref Range   Total Bilirubin 0.7 0.2 - 1.2 mg/dL   Bilirubin, Direct 0.1 0.0 - 0.3 mg/dL   Alkaline Phosphatase 76 39 - 117 U/L   AST 21 0 - 37 U/L   ALT 31 0 - 53 U/L   Total Protein 6.8 6.0 - 8.3 g/dL   Albumin 4.1 3.5 - 5.2 g/dL  CBC with Differential/Platelet  Result Value Ref Range   WBC 5.0 4.0 - 10.5 K/uL   RBC 4.72 4.22 - 5.81 Mil/uL   Hemoglobin 14.7 13.0 - 17.0 g/dL   HCT 43.2 39 - 52 %   MCV 91.5 78.0 - 100.0 fl   MCHC 34.1 30.0 - 36.0 g/dL   RDW 13.1 11.5 - 15.5 %   Platelets 195.0 150 - 400 K/uL   Neutrophils Relative % 55.3 43 - 77 %   Lymphocytes Relative 23.8 12 - 46 %   Monocytes Relative 11.6 3 - 12 %   Eosinophils Relative 8.4 (H) 0 - 5 %   Basophils Relative 0.9 0 - 3 %   Neutro Abs 2.8 1.4 - 7.7 K/uL   Lymphs  Abs 1.2 0.7 - 4.0 K/uL   Monocytes Absolute 0.6 0 - 1 K/uL   Eosinophils Absolute 0.4 0 - 0 K/uL   Basophils Absolute 0.0 0 - 0 K/uL  Basic metabolic panel  Result Value Ref Range   Sodium 139 135 - 145 mEq/L   Potassium 4.2 3.5 - 5.1 mEq/L   Chloride 102 96 - 112 mEq/L   CO2 29 19 - 32 mEq/L   Glucose, Bld 126 (H) 70 - 99 mg/dL   BUN 13 6 - 23 mg/dL   Creatinine, Ser 0.93 0.40 - 1.50 mg/dL   GFR 81.39 >60.00 mL/min   Calcium 9.2 8.4 - 10.5 mg/dL  PSA  Result Value Ref Range   PSA 1.65 0.10 - 4.00 ng/mL    Assessment and Plan:     ICD-10-CM   1. Healthcare maintenance  Z00.00   2. Controlled type 2 diabetes mellitus without complication, without long-term current use of insulin (HCC)  E11.9 CT HEART W/O CONTRAST MEDIA - QUANT EVAL CORONARY CALCIUM  3. Hyperlipidemia, unspecified hyperlipidemia type  E78.5 CT HEART W/O CONTRAST MEDIA - QUANT EVAL CORONARY CALCIUM   Globally he is doing well, and he will get his Prenar-13 and flu shots from a pharmacy. WOrk on [employer], diet, and exercise - goal of 200 lbs.  Health Maintenance Exam: The patient's preventative maintenance and recommended screening tests for an annual wellness exam were reviewed in full today. Brought up to date unless services declined.  Counselled on the importance of diet, exercise, and its role in overall health and mortality. The patient's FH and SH was reviewed, including their home life, tobacco status, and drug and alcohol status.  Follow-up in 1 year for physical exam or additional follow-up below.  Follow-up: No follow-ups on file. Or follow-up in 1 year if not noted.  Meds ordered this encounter  Medications  . sildenafil (VIAGRA) 100 MG tablet    Sig: Take 0.5-1 tablets (50-100 mg total) by mouth daily  as needed for erectile dysfunction.    Dispense:  5 tablet    Refill:  11   Medications Discontinued During This Encounter  Medication Reason  . predniSONE (DELTASONE) 20 MG tablet   .  cyclobenzaprine (FLEXERIL) 10 MG tablet   . sildenafil (REVATIO) 20 MG tablet    Orders Placed This Encounter  Procedures  . CT HEART W/O CONTRAST MEDIA - QUANT EVAL CORONARY CALCIUM    Signed,  Yeng Perz T. Cozy Veale, MD   Allergies as of 08/31/2019   No Known Allergies     Medication List       Accurate as of August 31, 2019 11:59 PM. If you have any questions, ask your nurse or doctor.        STOP taking these medications   cyclobenzaprine 10 MG tablet Commonly known as: FLEXERIL Stopped by: Owens Loffler, MD   predniSONE 20 MG tablet Commonly known as: DELTASONE Stopped by: Owens Loffler, MD   sildenafil 20 MG tablet Commonly known as: Revatio Stopped by: Owens Loffler, MD     TAKE these medications   aspirin EC 81 MG tablet Take 81 mg by mouth daily.   LORazepam 0.5 MG tablet Commonly known as: ATIVAN Take 1 tablet (0.5 mg total) by mouth every 8 (eight) hours as needed for anxiety.   multivitamin with minerals tablet Take 1 tablet by mouth daily.   Omega 3 1200 MG Caps   sildenafil 100 MG tablet Commonly known as: Viagra Take 0.5-1 tablets (50-100 mg total) by mouth daily as needed for erectile dysfunction. Started by: Owens Loffler, MD   SUDAFED PO Take 1 tablet by mouth daily as needed (allergies).   ZyrTEC Allergy 10 MG Caps Generic drug: Cetirizine HCl

## 2019-09-01 ENCOUNTER — Encounter: Payer: Self-pay | Admitting: Family Medicine

## 2019-09-05 ENCOUNTER — Encounter: Payer: Self-pay | Admitting: Family Medicine

## 2019-09-05 DIAGNOSIS — R931 Abnormal findings on diagnostic imaging of heart and coronary circulation: Secondary | ICD-10-CM

## 2019-09-05 DIAGNOSIS — Z8249 Family history of ischemic heart disease and other diseases of the circulatory system: Secondary | ICD-10-CM

## 2019-09-05 DIAGNOSIS — E7800 Pure hypercholesterolemia, unspecified: Secondary | ICD-10-CM

## 2019-09-05 DIAGNOSIS — E785 Hyperlipidemia, unspecified: Secondary | ICD-10-CM

## 2019-09-08 DIAGNOSIS — R69 Illness, unspecified: Secondary | ICD-10-CM | POA: Diagnosis not present

## 2019-09-13 DIAGNOSIS — Z20828 Contact with and (suspected) exposure to other viral communicable diseases: Secondary | ICD-10-CM | POA: Diagnosis not present

## 2019-09-13 NOTE — Addendum Note (Signed)
Addended by: Owens Loffler on: 09/13/2019 02:02 PM   Modules accepted: Orders

## 2019-09-16 ENCOUNTER — Encounter (HOSPITAL_COMMUNITY): Payer: Self-pay | Admitting: Emergency Medicine

## 2019-09-16 ENCOUNTER — Emergency Department (HOSPITAL_COMMUNITY): Payer: Medicare HMO

## 2019-09-16 ENCOUNTER — Emergency Department (HOSPITAL_COMMUNITY)
Admission: EM | Admit: 2019-09-16 | Discharge: 2019-09-16 | Disposition: A | Discharge status: Home / Self Care | Payer: Medicare HMO | Attending: Emergency Medicine | Admitting: Emergency Medicine

## 2019-09-16 ENCOUNTER — Other Ambulatory Visit: Payer: Self-pay

## 2019-09-16 DIAGNOSIS — Z87891 Personal history of nicotine dependence: Secondary | ICD-10-CM | POA: Insufficient documentation

## 2019-09-16 DIAGNOSIS — E119 Type 2 diabetes mellitus without complications: Secondary | ICD-10-CM | POA: Diagnosis not present

## 2019-09-16 DIAGNOSIS — Y9389 Activity, other specified: Secondary | ICD-10-CM | POA: Diagnosis not present

## 2019-09-16 DIAGNOSIS — M795 Residual foreign body in soft tissue: Secondary | ICD-10-CM | POA: Diagnosis not present

## 2019-09-16 DIAGNOSIS — Z79899 Other long term (current) drug therapy: Secondary | ICD-10-CM | POA: Diagnosis not present

## 2019-09-16 DIAGNOSIS — Z96641 Presence of right artificial hip joint: Secondary | ICD-10-CM | POA: Diagnosis not present

## 2019-09-16 DIAGNOSIS — Y999 Unspecified external cause status: Secondary | ICD-10-CM | POA: Diagnosis not present

## 2019-09-16 DIAGNOSIS — S61502A Unspecified open wound of left wrist, initial encounter: Secondary | ICD-10-CM

## 2019-09-16 DIAGNOSIS — W228XXA Striking against or struck by other objects, initial encounter: Secondary | ICD-10-CM | POA: Insufficient documentation

## 2019-09-16 DIAGNOSIS — S6992XA Unspecified injury of left wrist, hand and finger(s), initial encounter: Secondary | ICD-10-CM | POA: Diagnosis present

## 2019-09-16 DIAGNOSIS — Y9289 Other specified places as the place of occurrence of the external cause: Secondary | ICD-10-CM | POA: Insufficient documentation

## 2019-09-16 DIAGNOSIS — Z7982 Long term (current) use of aspirin: Secondary | ICD-10-CM | POA: Diagnosis not present

## 2019-09-16 DIAGNOSIS — S61512A Laceration without foreign body of left wrist, initial encounter: Secondary | ICD-10-CM | POA: Diagnosis not present

## 2019-09-16 NOTE — ED Notes (Signed)
Patient verbalized understanding of DC instructions and follow up care

## 2019-09-16 NOTE — Discharge Instructions (Signed)
Clean wound by soaking in warm water or use a warm compress 3 times a day, then soap and water, rinse well and apply a Band-Aid.  This wound will gradually close in, and this treatment will hopefully allow the tiny foreign body to work its way out.

## 2019-09-16 NOTE — ED Triage Notes (Signed)
Pt accidentally had a drill bit go through his L medial wrist just PTA.  Bleeding controlled.  CMS intact.

## 2019-09-16 NOTE — ED Notes (Signed)
Discharge teaching reviewed with patient and family.  Pt verbalized understanding.

## 2019-09-16 NOTE — ED Provider Notes (Signed)
[facility] EMERGENCY DEPARTMENT Provider Note   CSN: [id number] Arrival date & time: 09/16/19  1436     History Chief Complaint  Patient presents with  . wrist injury    Charles [name] is a 64 y.o. male.  HPI Is here for evaluation of injury to left wrist, which occurred when he was drilling through some metal and wood, and he accidentally injured himself.  He washed it well before coming here.  Tetanus status is up-to-date.  No other injuries.  Past Medical History:  Diagnosis Date  . Anxiety   . Controlled type 2 diabetes mellitus without complication, without long-term current use of insulin ([place])   . Diverticulitis   . Generalized anxiety disorder 10/21/2014  . GERD (gastroesophageal reflux disease)     Patient Active Problem List   Diagnosis Date Noted  . Status post total replacement of right hip 12/07/2014  . Hyperlipidemia 10/21/2014  . Generalized anxiety disorder 10/21/2014  . GERD (gastroesophageal reflux disease)   . Controlled type 2 diabetes mellitus without complication, without long-term current use of insulin ([place])   . S/P Achilles tendon repair 01/06/2000    Past Surgical History:  Procedure Laterality Date  . ACHILLES TENDON REPAIR  2001  . BICEPS TENDON REPAIR Left 2014  . colonscopy     . HEMORROIDECTOMY  2012  . ROTATOR CUFF REPAIR Left 2014  . TOTAL HIP ARTHROPLASTY Right 12/07/2014   Procedure: RIGHT TOTAL HIP ARTHROPLASTY ANTERIOR APPROACH;  Surgeon: Mcarthur Rossetti, MD;  Location: [facility];  Service: Orthopedics;  Laterality: Right;       Family History  Problem Relation Age of Onset  . Alcohol abuse Father   . Heart attack Father   . Hyperlipidemia Father   . Stroke Father   . Hypertension Father   . Breast cancer Mother   . Hypertension Mother     Social History   Tobacco Use  . Smoking status: Former Smoker    Packs/day: 0.50    Years: 20.00    Pack years: 10.00    Types: Cigarettes    Quit  date: 01/06/2000    Years since quitting: 19.7  . Smokeless tobacco: Former Systems developer    Types: [place] date: 01/06/2004  Substance Use Topics  . Alcohol use: No    Alcohol/week: 0.0 standard drinks    Comment: occ/ heavy drinker in past   . Drug use: No    Home Medications Prior to Admission medications   Medication Sig Start Date End Date Taking? Authorizing Provider  aspirin EC 81 MG tablet Take 81 mg by mouth daily.    [provider]  Cetirizine HCl (ZYRTEC ALLERGY) 10 MG CAPS  01/05/17   [provider]  LORazepam (ATIVAN) 0.5 MG tablet Take 1 tablet (0.5 mg total) by mouth every 8 (eight) hours as needed for anxiety. 08/31/19   Copland, Frederico Hamman, MD  Multiple Vitamins-Minerals (MULTIVITAMIN WITH MINERALS) tablet Take 1 tablet by mouth daily.    [provider]  Omega 3 1200 MG CAPS  01/05/17   [provider]  Pseudoephedrine HCl (SUDAFED PO) Take 1 tablet by mouth daily as needed (allergies).     [provider]  sildenafil (VIAGRA) 100 MG tablet Take 0.5-1 tablets (50-100 mg total) by mouth daily as needed for erectile dysfunction. 08/31/19   Owens Loffler, MD    Allergies    Patient has no known allergies.  Review of Systems   Review  of Systems  All other systems reviewed and are negative.   Physical Exam Updated Vital Signs BP (!) 137/100 (BP Location: Right Arm)   Pulse 63   Temp 97.6 F (36.4 C) (Oral)   Resp 15   SpO2 100%   Physical Exam Vitals and nursing note reviewed.  Constitutional:      Appearance: He is well-developed.  HENT:     Head: Normocephalic and atraumatic.     Right Ear: External ear normal.     Left Ear: External ear normal.  Eyes:     Conjunctiva/sclera: Conjunctivae normal.     Pupils: Pupils are equal, round, and reactive to light.  Neck:     Trachea: Phonation normal.  Cardiovascular:     Rate and Rhythm: Normal rate.  Pulmonary:     Effort: Pulmonary effort is normal.  Abdominal:      General: There is no distension.  Musculoskeletal:        General: No swelling. Normal range of motion.     Cervical back: Normal range of motion and neck supple.  Skin:    General: Skin is warm and dry.     Comments: Left ulnar wrist with superficial full-thickness injury exposing adipose tissue.  No skin loss.  No visible or palpable foreign body.  Neurovascularly intact distally in the left hand, normal function of hand and wrist.  Neurological:     Mental Status: He is alert and oriented to person, place, and time.     Cranial Nerves: No cranial nerve deficit.     Sensory: No sensory deficit.     Motor: No abnormal muscle tone.     Coordination: Coordination normal.  Psychiatric:        Mood and Affect: Mood normal.        Behavior: Behavior normal.        Thought Content: Thought content normal.        Judgment: Judgment normal.     ED Results / Procedures / Treatments   Labs (all labs ordered are listed, but only abnormal results are displayed) Labs Reviewed - No data to display  EKG None  Radiology DG Wrist Complete Left  Result Date: 09/16/2019 CLINICAL DATA:  Laceration to the MEDIAL LEFT wrist earlier today while the patient was using a drill. Initial encounter. EXAM: LEFT WRIST - COMPLETE 3+ VIEW COMPARISON:  None. FINDINGS: Soft tissue laceration medially with a tiny opaque foreign body in the soft tissues. No evidence of acute fracture or dislocation. Well-preserved joint spaces. Well-preserved bone mineral density. IMPRESSION: 1. No osseous abnormality. 2. Soft tissue laceration medially with a tiny opaque foreign body in the soft tissues. Electronically Signed   By: Evangeline Dakin M.D.   On: 09/16/2019 16:01    Procedures Procedures (including critical care time)  Medications Ordered in ED Medications - No data to display  ED Course  I have reviewed the triage vital signs and the nursing notes.  Pertinent labs & imaging results that were available during  my care of the patient were reviewed by me and considered in my medical decision making (see chart for details).    MDM Rules/Calculators/A&P                           Patient Vitals for the past 24 hrs:  BP Temp Temp src Pulse Resp SpO2  09/16/19 1907 (!) 137/100 97.6 F (36.4 C) Oral 63 15 100 %  09/16/19  1514 114/80 98.9 F (37.2 C) Oral 65 16 96 %    7:39 PM Reevaluation with update and discussion. After initial assessment and treatment, an updated evaluation reveals no change in status, findings discussed with patient and wife, all questions answered. Daleen Bo   Medical Decision Making:  This patient is presenting for evaluation of wound to left wrist., which does require a range of treatment options, and is a complaint that involves a moderate risk of morbidity and mortality. The differential diagnoses include laceration, puncture, retained foreign body. I decided to review old records, and in summary patient accidentally injured her wrist, the wound is superficial, and open.  I obtained additional historical information from his wife at the bedside.  Radiologic Tests Ordered, included left wrist.  I independently Visualized: Radiographic images, which show tiny foreign body less than 1 mm, no fracture   Critical Interventions-clinical evaluation, radiographs, observation reassessment  After These Interventions, the Patient was reevaluated and was found stable for discharge.  Discussed possible wound closure, versus ED washout.  Patient prefers to go home so can clean the wound.  Wife with him is agreeable.  Patient has a very tiny foreign body which is unlikely to be able to be recovered, or even seen if it does come out.  No indication for wound closure, or further ED evaluation.  CRITICAL CARE-no Performed by: Daleen Bo  Nursing Notes Reviewed/ Care Coordinated Applicable Imaging Reviewed Interpretation of Laboratory Data incorporated into ED treatment  The  patient appears reasonably screened and/or stabilized for discharge and I doubt any other medical condition or other [facility] requiring further screening, evaluation, or treatment in the ED at this time prior to discharge.  Plan: Home Medications-continue usual; Home Treatments-wound care 3 times daily; return here if the recommended treatment, does not improve the symptoms; Recommended follow up-return here or see PCP as needed for problems.     Final Clinical Impression(s) / ED Diagnoses Final diagnoses:  Open wound of left wrist, initial encounter  Foreign body (FB) in soft tissue    Rx / DC Orders ED Discharge Orders    None       Daleen Bo, MD 09/16/19 1956

## 2019-09-18 ENCOUNTER — Telehealth: Payer: Self-pay | Admitting: Family Medicine

## 2019-09-18 DIAGNOSIS — E119 Type 2 diabetes mellitus without complications: Secondary | ICD-10-CM

## 2019-09-18 DIAGNOSIS — E1169 Type 2 diabetes mellitus with other specified complication: Secondary | ICD-10-CM

## 2019-09-18 NOTE — Telephone Encounter (Signed)
Charles [name] called on pt's behalf in ref to coronary calcium score and says the cardiologist has not call them to schedule yet.  Requests a return call.  [phone]  Thank you!

## 2019-09-18 NOTE — Telephone Encounter (Signed)
Can you help me with this?  I tried to order a coronary calcium score for the patient.  Normally, [facility] does this for the patient with a fixed cost.  It is entirely possible that I put the order in incorrectly.  Can you let him know about an appointment time.

## 2019-09-18 NOTE — Telephone Encounter (Signed)
Please sign off on cardiac order. Thanks.   Pt is okay with going to [place] [facility] and paying $150 out of pocket. I made them aware of another location that is $99 out of pocket if they were interested - located in [place]. They chose to stay within [facility] and go to [facility].

## 2019-09-19 NOTE — Telephone Encounter (Signed)
The 10-year ASCVD risk score Mikey Bussing DC Brooke Bonito., et al., 2013) is: 28.9%   Values used to calculate the score:     Age: 64 years     Sex: Male     Is Non-Hispanic African American: No     Diabetic: Yes     Tobacco smoker: No     Systolic Blood Pressure: 802 mmHg     Is BP treated: No     HDL Cholesterol: 41.7 mg/dL     Total Cholesterol: 209 mg/dL   Can you call them and let them know it is getting set up.. I put the wrong order into the computer, and I had to fix it.  Apologies.

## 2019-09-20 NOTE — Telephone Encounter (Signed)
LM for [facility] to call back to schedule  Spoke with patient wife, the patient is available most Tuesdays and Thursdays ONLY  Upcoming dates available: 9/21, 9/28, 9/30, 10/5, 10/7, 10/12, 10/14

## 2019-09-21 NOTE — Telephone Encounter (Signed)
Patient is scheduled for his Cardiac Score on 10/03/19 at 10:30 (10:15 arrival) Mychart message sent per Wife's request with date/time/location of appt.   Message sent  Nothing further needed.

## 2019-10-03 ENCOUNTER — Ambulatory Visit (INDEPENDENT_AMBULATORY_CARE_PROVIDER_SITE_OTHER)
Admission: RE | Admit: 2019-10-03 | Discharge: 2019-10-03 | Disposition: A | Discharge status: Home / Self Care | Payer: Self-pay | Source: Ambulatory Visit | Attending: Family Medicine | Admitting: Family Medicine

## 2019-10-03 ENCOUNTER — Other Ambulatory Visit: Payer: Self-pay

## 2019-10-03 DIAGNOSIS — E1169 Type 2 diabetes mellitus with other specified complication: Secondary | ICD-10-CM

## 2019-10-03 DIAGNOSIS — E119 Type 2 diabetes mellitus without complications: Secondary | ICD-10-CM

## 2019-10-03 DIAGNOSIS — E785 Hyperlipidemia, unspecified: Secondary | ICD-10-CM

## 2019-10-04 ENCOUNTER — Other Ambulatory Visit: Payer: Self-pay | Admitting: Family Medicine

## 2019-10-04 DIAGNOSIS — R931 Abnormal findings on diagnostic imaging of heart and coronary circulation: Secondary | ICD-10-CM

## 2019-10-04 DIAGNOSIS — I10 Essential (primary) hypertension: Secondary | ICD-10-CM

## 2019-10-04 DIAGNOSIS — E785 Hyperlipidemia, unspecified: Secondary | ICD-10-CM

## 2019-10-04 DIAGNOSIS — Z8249 Family history of ischemic heart disease and other diseases of the circulatory system: Secondary | ICD-10-CM

## 2019-10-04 DIAGNOSIS — E119 Type 2 diabetes mellitus without complications: Secondary | ICD-10-CM

## 2019-10-04 DIAGNOSIS — E1169 Type 2 diabetes mellitus with other specified complication: Secondary | ICD-10-CM

## 2019-10-04 MED ORDER — ATORVASTATIN CALCIUM 40 MG PO TABS: 40.0000 mg | ORAL_TABLET | Freq: Every day | ORAL | 3 refills | Status: DC

## 2019-10-04 NOTE — Telephone Encounter (Signed)
-----   Message from Carter Kitten, [place] sent at 10/04/2019 11:48 AM EDT ----- Lovey Newcomer notified as instructed by telephone.  Please send in Rx for [facility] medication [facility] [place].  Please refer to Dr. Worthy Rancher, Cardiologist.

## 2019-10-04 NOTE — Telephone Encounter (Signed)
-----   Message from Carter Kitten, [place] sent at 10/04/2019 11:48 AM EDT ----- Lovey Newcomer notified as instructed by telephone.  Please send in Rx for [facility] medication [facility] [place].  Please refer to Dr. Worthy Rancher, Cardiologist.

## 2019-10-09 ENCOUNTER — Encounter: Payer: Self-pay | Admitting: Family Medicine

## 2019-10-09 NOTE — Telephone Encounter (Signed)
[name]'s cardiology appointment has already been made by his wife

## 2019-10-09 NOTE — Telephone Encounter (Signed)
Noted.   Referral authorized/closed as appt has been scheduled.

## 2019-10-14 ENCOUNTER — Other Ambulatory Visit: Payer: Self-pay | Admitting: Family Medicine

## 2019-10-16 NOTE — Telephone Encounter (Signed)
Last office visit 08/31/2019 for CPE.  Last refilled 08/31/2019 for #30 with no refills.  No future appointments with PCP.

## 2019-10-23 ENCOUNTER — Encounter: Payer: Medicare HMO | Admitting: Family Medicine

## 2019-10-23 NOTE — Progress Notes (Signed)
Cardiology Office Note:    Date:  10/27/2019   ID:  Charles [name], DOB 30-Jan-1955, MRN [id number]  PCP:  Owens Loffler, MD  Cardiologist:  No primary care provider on file.  Electrophysiologist:  None   Referring MD: Owens Loffler, MD   Chief Complaint  Patient presents with  . Coronary Artery Disease    History of Present Illness:    Charles [name] is a 64 y.o. male with a hx of T2DM who is referred by Dr. Margaretmary Bayley for evaluation of CAD.    He reports that he has been having intermittent chest pain,, occurs a few times per month.  Describes sharp pain in the center of his chest that can last up to 1 minute.  Can occur at rest or with exertion.  Also reports getting short of breath with exertion.  Also has been having lightheadedness when he stands.  Denies any syncope, palpitations, or lower extremity edema.  Golfs 2-3 times per week for exercise, walks the course.  Smoked for 20 years, quit in 2002.  Family history includes father had MI at age 89, CVA in 83s.  Calcium score 155 (63rd percentile) on 10/03/2019.  Past Medical History:  Diagnosis Date  . Anxiety   . Controlled type 2 diabetes mellitus without complication, without long-term current use of insulin ([place])   . Diverticulitis   . Generalized anxiety disorder 10/21/2014  . GERD (gastroesophageal reflux disease)     Past Surgical History:  Procedure Laterality Date  . ACHILLES TENDON REPAIR  2001  . BICEPS TENDON REPAIR Left 2014  . colonscopy     . HEMORROIDECTOMY  2012  . ROTATOR CUFF REPAIR Left 2014  . TOTAL HIP ARTHROPLASTY Right 12/07/2014   Procedure: RIGHT TOTAL HIP ARTHROPLASTY ANTERIOR APPROACH;  Surgeon: Mcarthur Rossetti, MD;  Location: [facility];  Service: Orthopedics;  Laterality: Right;    Current Medications: Current Meds  Medication Sig  . aspirin EC 81 MG tablet Take 81 mg by mouth daily.  Marland Kitchen atorvastatin (LIPITOR) 40 MG tablet Take 1 tablet (40 mg total) by mouth daily.  . Cetirizine  HCl (ZYRTEC ALLERGY) 10 MG CAPS   . LORazepam (ATIVAN) 0.5 MG tablet TAKE 1 TABLET BY MOUTH EVERY 8 HOURS AS NEEDED FOR ANXIETY.  . Multiple Vitamins-Minerals (MULTIVITAMIN WITH MINERALS) tablet Take 1 tablet by mouth daily.  . Omega 3 1200 MG CAPS   . Pseudoephedrine HCl (SUDAFED PO) Take 1 tablet by mouth daily as needed (allergies).   . sildenafil (VIAGRA) 100 MG tablet Take 0.5-1 tablets (50-100 mg total) by mouth daily as needed for erectile dysfunction.     Allergies:   Patient has no known allergies.   Social History   Socioeconomic History  . Marital status: Married    Spouse name: Not on file  . Number of children: Not on file  . Years of education: Not on file  . Highest education level: Not on file  Occupational History  . Occupation: insurance adj  Tobacco Use  . Smoking status: Former Smoker    Packs/day: 0.50    Years: 20.00    Pack years: 10.00    Types: Cigarettes    Quit date: 01/06/2000    Years since quitting: 19.8  . Smokeless tobacco: Former Systems developer    Types: [place] date: 01/06/2004  Substance and Sexual Activity  . Alcohol use: No    Alcohol/week: 0.0 standard drinks    Comment: occ/ heavy drinker in  past   . Drug use: No  . Sexual activity: Yes    Partners: Female  Other Topics Concern  . Not on file  Social History Narrative   Married, Location manager   Social Determinants of Health   Financial Resource Strain:   . Difficulty of Paying Living Expenses: Not on file  Food Insecurity:   . Worried About Charity fundraiser in the Last Year: Not on file  . Ran Out of Food in the Last Year: Not on file  Transportation Needs:   . Lack of Transportation (Medical): Not on file  . Lack of Transportation (Non-Medical): Not on file  Physical Activity:   . Days of Exercise per Week: Not on file  . Minutes of Exercise per Session: Not on file  Stress:   . Feeling of Stress : Not on file  Social  Connections:   . Frequency of Communication with Friends and Family: Not on file  . Frequency of Social Gatherings with Friends and Family: Not on file  . Attends Religious Services: Not on file  . Active Member of Clubs or Organizations: Not on file  . Attends Archivist Meetings: Not on file  . Marital Status: Not on file     Family History: The patient's family history includes Alcohol abuse in his father; Breast cancer in his mother; Heart attack in his father; Hyperlipidemia in his father; Hypertension in his father and mother; Stroke in his father.  ROS:   Please see the history of present illness.     All other systems reviewed and are negative.  EKGs/Labs/Other Studies Reviewed:    The following studies were reviewed today:   EKG:  EKG is  ordered today.  The ekg ordered today demonstrates NSR, right axis deviation, rate 59  Recent Labs: 08/28/2019: ALT 31; BUN 13; Creatinine, Ser 0.93; Hemoglobin 14.7; Platelets 195.0; Potassium 4.2; Sodium 139  Recent Lipid Panel    Component Value Date/Time   CHOL 209 (H) 08/28/2019 0849   TRIG 153.0 (H) 08/28/2019 0849   HDL 41.70 08/28/2019 0849   CHOLHDL 5 08/28/2019 0849   VLDL 30.6 08/28/2019 0849   LDLCALC 136 (H) 08/28/2019 0849   LDLDIRECT 141.0 10/21/2016 0833    Physical Exam:    VS:  BP 115/60   Pulse (!) 59   Temp (!) 96.6 F (35.9 C)   Ht $R'5\' 10"'rP$  (1.778 m)   Wt 224 lb 3.2 oz (101.7 kg)   SpO2 96%   BMI 32.17 kg/m     Wt Readings from Last 3 Encounters:  10/27/19 224 lb 3.2 oz (101.7 kg)  08/31/19 223 lb (101.2 kg)  06/23/18 215 lb (97.5 kg)     GEN:  Well nourished, well developed in no acute distress HEENT: Normal NECK: No JVD; No carotid bruits LYMPHATICS: No lymphadenopathy CARDIAC: RRR, no murmurs, rubs, gallops RESPIRATORY:  Clear to auscultation without rales, wheezing or rhonchi  ABDOMEN: Soft, non-tender, non-distended MUSCULOSKELETAL:  No edema; No deformity  SKIN: Warm and  dry NEUROLOGIC:  Alert and oriented x 3 PSYCHIATRIC:  Normal affect   ASSESSMENT:    1. Chest pain of uncertain etiology   2. Leg pain, bilateral   3. Hyperlipidemia, unspecified hyperlipidemia type   4. Lightheadedness   5. Coronary artery disease involving native coronary artery of native heart, unspecified whether angina present    PLAN:    CAD: Calcium score 155 (63rd percentile)  on 10/03/2019.  He reports atypical chest pain and exertional dyspnea.  Given known CAD on calcium score, warrants further evaluation for ischemia -Coronary CTA -Echocardiogram -Continue atorvastatin 40 mg daily  T2DM: Currently diet controlled, last A1c 6.4% on 08/28/2019.  Hyperlipidemia:  LDL 136 on 08/28/2019, started on atorvastatin 40 mg daily on 10/04/2019.  We will plan to recheck lipid panel once on statin for 2 to 3 months.  Goal LDL less than 70.  Lightheadedness: Description suggest orthostasis, encouraged to stay hydrated.  Not on antihypertensives.  Orthostatics in clinic today showed drop in BP with standing but not meeting criteria for orthostatic hypotension.  Leg pain/numbness: Will check ABIs to screen for PAD  RTC in 3 months  Medication Adjustments/Labs and Tests Ordered: Current medicines are reviewed at length with the patient today.  Concerns regarding medicines are outlined above.  Orders Placed This Encounter  Procedures  . CT CORONARY MORPH W/CTA COR W/SCORE W/CA W/CM &/OR WO/CM  . CT CORONARY FRACTIONAL FLOW RESERVE DATA PREP  . CT CORONARY FRACTIONAL FLOW RESERVE FLUID ANALYSIS  . EKG 12-Lead  . ECHOCARDIOGRAM COMPLETE  . VAS [place] LOWER EXTREMITY ARTERIAL DUPLEX  . VAS [place] ABI WITH/WO TBI   Meds ordered this encounter  Medications  . metoprolol tartrate (LOPRESSOR) 25 MG tablet    Sig: Take 25 mg (1 tablet) TWO hours prior to CT    Dispense:  1 tablet    Refill:  0    Patient Instructions  Medication Instructions:  Your physician recommends that you continue on  your current medications as directed. Please refer to the Current Medication list given to you today.  Testing/Procedures: Your physician has requested that you have an echocardiogram. Echocardiography is a painless test that uses sound waves to create images of your heart. It provides your doctor with information about the size and shape of your heart and how well your heart's chambers and valves are working. This procedure takes approximately one hour. There are no restrictions for this procedure. This will be done at our [facility] location:  [place] has requested that you have an ankle brachial index (ABI). During this test an ultrasound and blood pressure cuff are used to evaluate the arteries that supply the arms and legs with blood. Allow thirty minutes for this exam. There are no restrictions or special instructions.  Coronary CTA-see instruction sheet  Follow-Up: At [facility], you and your health needs are our priority.  As part of our continuing mission to provide you with exceptional heart care, we have created designated Provider Care Teams.  These Care Teams include your primary Cardiologist (physician) and Advanced Practice Providers (APPs -  Physician Assistants and Nurse Practitioners) who all work together to provide you with the care you need, when you need it.  We recommend signing up for the patient portal called "MyChart".  Sign up information is provided on this After Visit Summary.  MyChart is used to connect with patients for Virtual Visits (Telemedicine).  Patients are able to view lab/test results, encounter notes, upcoming appointments, etc.  Non-urgent messages can be sent to your provider as well.   To learn more about what you can do with MyChart, go to [email or web address].    Your next appointment:   3 month(s)  The format for your next appointment:   In Person  Provider:   Oswaldo Milian, MD   Other  Instructions Your cardiac CT will be scheduled at  one of the below locations:   [facility] [address] [place], [place] [address] [phone]  [place] [address] [place], [place] [address] [phone]  If scheduled at [facility], please arrive at the [facility] main entrance of [facility] 30 minutes prior to test start time. Proceed to the [facility] Radiology Department (first floor) to check-in and test prep.  If scheduled at [facility], please arrive 15 mins early for check-in and test prep.  Please follow these instructions carefully (unless otherwise directed):  Hold all erectile dysfunction medications at least 3 days (72 hrs) prior to test.  On the Night Before the Test: . Be sure to Drink plenty of water. . Do not consume any caffeinated/decaffeinated beverages or chocolate 12 hours prior to your test. . Do not take any antihistamines 12 hours prior to your test.  On the Day of the Test: . Drink plenty of water. Do not drink any water within one hour of the test. . Do not eat any food 4 hours prior to the test. . You may take your regular medications prior to the test.  . Take metoprolol (Lopressor) two hours prior to test.     After the Test: . Drink plenty of water. . After receiving IV contrast, you may experience a mild flushed feeling. This is normal. . On occasion, you may experience a mild rash up to 24 hours after the test. This is not dangerous. If this occurs, you can take Benadryl 25 mg and increase your fluid intake. . If you experience trouble breathing, this can be serious. If it is severe call 911 IMMEDIATELY. If it is mild, please call our office.  Once we have confirmed authorization from your insurance company, we will call you to set up a date and time for your test. Based on how quickly your insurance processes prior  authorizations requests, please allow up to 4 weeks to be contacted for scheduling your Cardiac CT appointment. Be advised that routine Cardiac CT appointments could be scheduled as many as 8 weeks after your provider has ordered it.  For non-scheduling related questions, please contact the cardiac imaging nurse navigator should you have any questions/concerns: Marchia Bond, Cardiac Imaging Nurse Navigator Burley Saver, Interim Cardiac Imaging Nurse [place] and Vascular Services Direct Office Dial: [phone]   For scheduling needs, including cancellations and rescheduling, please call Vivien Rota at [phone], option 3.        Signed, Donato Heinz, MD  10/27/2019 1:04 PM    [place]

## 2019-10-27 ENCOUNTER — Ambulatory Visit: Payer: Medicare HMO | Admitting: Cardiology

## 2019-10-27 ENCOUNTER — Other Ambulatory Visit: Payer: Self-pay

## 2019-10-27 ENCOUNTER — Encounter: Payer: Self-pay | Admitting: Cardiology

## 2019-10-27 VITALS — BP 115/60 | HR 59 | Temp 96.6°F | Ht 70.0 in | Wt 224.2 lb

## 2019-10-27 DIAGNOSIS — R079 Chest pain, unspecified: Secondary | ICD-10-CM | POA: Diagnosis not present

## 2019-10-27 DIAGNOSIS — M79604 Pain in right leg: Secondary | ICD-10-CM

## 2019-10-27 DIAGNOSIS — M79605 Pain in left leg: Secondary | ICD-10-CM

## 2019-10-27 DIAGNOSIS — I2510 Atherosclerotic heart disease of native coronary artery without angina pectoris: Secondary | ICD-10-CM | POA: Diagnosis not present

## 2019-10-27 DIAGNOSIS — E785 Hyperlipidemia, unspecified: Secondary | ICD-10-CM

## 2019-10-27 DIAGNOSIS — R42 Dizziness and giddiness: Secondary | ICD-10-CM

## 2019-10-27 MED ORDER — METOPROLOL TARTRATE 25 MG PO TABS: ORAL_TABLET | ORAL | 0 refills | Status: DC

## 2019-10-27 NOTE — Patient Instructions (Signed)
Medication Instructions:  Your physician recommends that you continue on your current medications as directed. Please refer to the Current Medication list given to you today.  Testing/Procedures: Your physician has requested that you have an echocardiogram. Echocardiography is a painless test that uses sound waves to create images of your heart. It provides your doctor with information about the size and shape of your heart and how well your heart's chambers and valves are working. This procedure takes approximately one hour. There are no restrictions for this procedure. This will be done at our [facility] location:  [place] has requested that you have an ankle brachial index (ABI). During this test an ultrasound and blood pressure cuff are used to evaluate the arteries that supply the arms and legs with blood. Allow thirty minutes for this exam. There are no restrictions or special instructions.  Coronary CTA-see instruction sheet  Follow-Up: At [facility], you and your health needs are our priority.  As part of our continuing mission to provide you with exceptional heart care, we have created designated Provider Care Teams.  These Care Teams include your primary Cardiologist (physician) and Advanced Practice Providers (APPs -  Physician Assistants and Nurse Practitioners) who all work together to provide you with the care you need, when you need it.  We recommend signing up for the patient portal called "MyChart".  Sign up information is provided on this After Visit Summary.  MyChart is used to connect with patients for Virtual Visits (Telemedicine).  Patients are able to view lab/test results, encounter notes, upcoming appointments, etc.  Non-urgent messages can be sent to your provider as well.   To learn more about what you can do with MyChart, go to [email or web address].    Your next appointment:   3 month(s)  The format for your next  appointment:   In Person  Provider:   Oswaldo Milian, MD   Other Instructions Your cardiac CT will be scheduled at one of the below locations:   [facility] [address] [place], [place] [address] [phone]  [place] [address] [place], [place] [address] [phone]  If scheduled at [facility], please arrive at the [facility] main entrance of [facility] 30 minutes prior to test start time. Proceed to the [facility] Radiology Department (first floor) to check-in and test prep.  If scheduled at [facility], please arrive 15 mins early for check-in and test prep.  Please follow these instructions carefully (unless otherwise directed):  Hold all erectile dysfunction medications at least 3 days (72 hrs) prior to test.  On the Night Before the Test: . Be sure to Drink plenty of water. . Do not consume any caffeinated/decaffeinated beverages or chocolate 12 hours prior to your test. . Do not take any antihistamines 12 hours prior to your test.  On the Day of the Test: . Drink plenty of water. Do not drink any water within one hour of the test. . Do not eat any food 4 hours prior to the test. . You may take your regular medications prior to the test.  . Take metoprolol (Lopressor) two hours prior to test.     After the Test: . Drink plenty of water. . After receiving IV contrast, you may experience a mild flushed feeling. This is normal. . On occasion, you may experience a mild rash up to 24 hours  after the test. This is not dangerous. If this occurs, you can take Benadryl 25 mg and increase your fluid intake. . If you experience trouble breathing, this can be serious. If it is severe call 911 IMMEDIATELY. If it is mild, please call our office.  Once we have confirmed authorization from your insurance company, we will call you to set up a date and time  for your test. Based on how quickly your insurance processes prior authorizations requests, please allow up to 4 weeks to be contacted for scheduling your Cardiac CT appointment. Be advised that routine Cardiac CT appointments could be scheduled as many as 8 weeks after your provider has ordered it.  For non-scheduling related questions, please contact the cardiac imaging nurse navigator should you have any questions/concerns: Marchia Bond, Cardiac Imaging Nurse Navigator Burley Saver, Interim Cardiac Imaging Nurse [place] and Vascular Services Direct Office Dial: [phone]   For scheduling needs, including cancellations and rescheduling, please call Vivien Rota at [phone], option 3.

## 2019-11-02 DIAGNOSIS — R69 Illness, unspecified: Secondary | ICD-10-CM | POA: Diagnosis not present

## 2019-11-03 ENCOUNTER — Ambulatory Visit (HOSPITAL_COMMUNITY): Payer: Medicare HMO | Attending: Cardiology

## 2019-11-03 ENCOUNTER — Other Ambulatory Visit: Payer: Self-pay

## 2019-11-03 DIAGNOSIS — R079 Chest pain, unspecified: Secondary | ICD-10-CM | POA: Diagnosis present

## 2019-11-03 LAB — ECHOCARDIOGRAM COMPLETE
Area-P 1/2: 2.4 cm2
S' Lateral: 1.9 cm

## 2019-11-08 ENCOUNTER — Ambulatory Visit (HOSPITAL_COMMUNITY)
Admission: RE | Admit: 2019-11-08 | Discharge: 2019-11-08 | Disposition: A | Discharge status: Home / Self Care | Payer: Medicare HMO | Source: Ambulatory Visit | Attending: Cardiology | Admitting: Cardiology

## 2019-11-08 ENCOUNTER — Other Ambulatory Visit: Payer: Self-pay

## 2019-11-08 DIAGNOSIS — M79605 Pain in left leg: Secondary | ICD-10-CM | POA: Insufficient documentation

## 2019-11-08 DIAGNOSIS — M79604 Pain in right leg: Secondary | ICD-10-CM | POA: Diagnosis not present

## 2019-11-16 ENCOUNTER — Ambulatory Visit: Payer: Medicare HMO | Attending: Internal Medicine

## 2019-11-16 DIAGNOSIS — Z23 Encounter for immunization: Secondary | ICD-10-CM

## 2019-11-16 NOTE — Progress Notes (Signed)
   Covid-19 Vaccination Clinic  Name:  Charles [name]    MRN: [id number] DOB: 1955/07/12  11/16/2019  Mr. [name] was observed post Covid-19 immunization for 15 minutes without incident. He was provided with Vaccine Information Sheet and instruction to access the V-Safe system.   Mr. Forget was instructed to call 911 with any severe reactions post vaccine: Marland Kitchen Difficulty breathing  . Swelling of face and throat  . A fast heartbeat  . A bad rash all over body  . Dizziness and weakness

## 2020-01-09 DIAGNOSIS — Z01812 Encounter for preprocedural laboratory examination: Secondary | ICD-10-CM

## 2020-01-10 NOTE — Addendum Note (Signed)
Addended by: Lindell Spar on: 01/10/2020 10:05 AM   Modules accepted: Orders

## 2020-01-11 DIAGNOSIS — Z01812 Encounter for preprocedural laboratory examination: Secondary | ICD-10-CM | POA: Diagnosis not present

## 2020-01-12 LAB — BASIC METABOLIC PANEL
BUN/Creatinine Ratio: 14 (ref 10–24)
BUN: 14 mg/dL (ref 8–27)
CO2: 25 mmol/L (ref 20–29)
Calcium: 9.3 mg/dL (ref 8.6–10.2)
Chloride: 102 mmol/L (ref 96–106)
Creatinine, Ser: 1 mg/dL (ref 0.76–1.27)
GFR calc Af Amer: 91 mL/min/{1.73_m2} (ref >59)
GFR calc non Af Amer: 79 mL/min/{1.73_m2} (ref >59)
Glucose: 97 mg/dL (ref 65–99)
Potassium: 4.2 mmol/L (ref 3.5–5.2)
Sodium: 139 mmol/L (ref 134–144)

## 2020-01-16 ENCOUNTER — Ambulatory Visit: Payer: Medicare HMO | Admitting: Cardiology

## 2020-01-16 ENCOUNTER — Telehealth (HOSPITAL_COMMUNITY): Payer: Self-pay | Admitting: Emergency Medicine

## 2020-01-16 NOTE — Telephone Encounter (Signed)
Reaching out to patient to offer assistance regarding upcoming cardiac imaging study; pt verbalizes understanding of appt date/time, parking situation and where to check in, pre-test NPO status and medications ordered, and verified current allergies; name and call back number provided for further questions should they arise Marchia Bond RN Navigator Cardiac Imaging [facility] [phone] office [phone] cell  Hold viagra, zyrtec; take 25mg  metoprolol 2h PTA

## 2020-01-18 ENCOUNTER — Encounter (HOSPITAL_COMMUNITY): Payer: Self-pay

## 2020-01-18 ENCOUNTER — Encounter: Payer: Self-pay | Admitting: *Deleted

## 2020-01-18 ENCOUNTER — Other Ambulatory Visit: Payer: Self-pay

## 2020-01-18 ENCOUNTER — Ambulatory Visit (HOSPITAL_COMMUNITY)
Admission: RE | Admit: 2020-01-18 | Discharge: 2020-01-18 | Disposition: A | Discharge status: Home / Self Care | Payer: Medicare HMO | Source: Ambulatory Visit | Attending: Cardiology | Admitting: Cardiology

## 2020-01-18 DIAGNOSIS — Z006 Encounter for examination for normal comparison and control in clinical research program: Secondary | ICD-10-CM

## 2020-01-18 DIAGNOSIS — R079 Chest pain, unspecified: Secondary | ICD-10-CM | POA: Diagnosis not present

## 2020-01-18 DIAGNOSIS — I2510 Atherosclerotic heart disease of native coronary artery without angina pectoris: Secondary | ICD-10-CM | POA: Diagnosis not present

## 2020-01-18 MED ORDER — NITROGLYCERIN 0.4 MG SL SUBL
SUBLINGUAL_TABLET | SUBLINGUAL | Status: AC
  Filled 2020-01-18: qty 2

## 2020-01-18 MED ORDER — NITROGLYCERIN 0.4 MG SL SUBL
0.8000 mg | SUBLINGUAL_TABLET | Freq: Once | SUBLINGUAL | Status: AC
  Administered 2020-01-18: 0.8 mg via SUBLINGUAL

## 2020-01-18 MED ORDER — IOHEXOL 350 MG/ML SOLN
80.0000 mL | Freq: Once | INTRAVENOUS | Status: AC | PRN
  Administered 2020-01-18: 80 mL via INTRAVENOUS

## 2020-01-18 NOTE — Research (Signed)
IDENTIFY Informed Consent                  Subject Name:   Charles [name]   Subject met inclusion and exclusion criteria.  The informed consent form, study requirements and expectations were reviewed with the subject and questions and concerns were addressed prior to the signing of the consent form.  The subject verbalized understanding of the trial requirements.  The subject agreed to participate in the IDENTIFY trial and signed the informed consent.  The informed consent was obtained prior to performance of any protocol-specific procedures for the subject.  A copy of the signed informed consent was given to the subject and a copy was placed in the subject's medical record.   [place] Dailey Alberson, Research Assistant  Apr 29, 1955  08:23 a.m.

## 2020-01-28 NOTE — Progress Notes (Signed)
Cardiology Office Note:    Date:  02/01/2020   ID:  Charles [name], DOB 1955/11/02, MRN [id number]  PCP:  Owens Loffler, MD  Cardiologist:  No primary care provider on file.  Electrophysiologist:  None   Referring MD: Owens Loffler, MD   Chief Complaint  Patient presents with  . Chest Pain    History of Present Illness:    Charles [name] is a 65 y.o. male with a hx of T2DM who presents for follow-up.  He was referred by Dr. Lorelei Pont for evaluation of CAD, initially seen on 10/27/2019.  He reports that he has been having intermittent chest pain, occurs a few times per month.  Describes sharp pain in the center of his chest that can last up to 1 minute.  Can occur at rest or with exertion.  Also reports getting short of breath with exertion.  Also has been having lightheadedness when he stands.  Denies any syncope, palpitations, or lower extremity edema.  Golfs 2-3 times per week for exercise, walks the course.  Smoked for 20 years, quit in 2002.  Family history includes father had MI at age 44, CVA in 56s.  Calcium score 155 (63rd percentile) on 10/03/2019.  Coronary CTA was done on 01/18/2020, which showed calcium score 165 (62nd percentile), nonobstructive CAD with mixed plaque in mid LAD causing 24 to 49% stenosis, calcified plaque in proximal LAD causing 0 to 24% stenosis, noncalcified plaque in proximal RCA causing 25 to 49% stenosis, calcified plaque in proximal and mid RCA causing 0 to 24% stenosis.  Echocardiogram on 11/03/2019 showed normal biventricular function, no significant valvular disease.  Since last clinic visit, he reports that he has been doing well.  Denies any further chest pain.  Denies any shortness of breath.  Reports he has been having episodes of dizziness the feel like room is spinning.  No syncopal episodes.  Denies any palpitations.   Past Medical History:  Diagnosis Date  . Anxiety   . Controlled type 2 diabetes mellitus without complication, without  long-term current use of insulin ([place])   . Diverticulitis   . Generalized anxiety disorder 10/21/2014  . GERD (gastroesophageal reflux disease)     Past Surgical History:  Procedure Laterality Date  . ACHILLES TENDON REPAIR  2001  . BICEPS TENDON REPAIR Left 2014  . colonscopy     . HEMORROIDECTOMY  2012  . ROTATOR CUFF REPAIR Left 2014  . TOTAL HIP ARTHROPLASTY Right 12/07/2014   Procedure: RIGHT TOTAL HIP ARTHROPLASTY ANTERIOR APPROACH;  Surgeon: Mcarthur Rossetti, MD;  Location: [facility];  Service: Orthopedics;  Laterality: Right;    Current Medications: Current Meds  Medication Sig  . aspirin EC 81 MG tablet Take 81 mg by mouth daily.  Marland Kitchen atorvastatin (LIPITOR) 40 MG tablet Take 1 tablet (40 mg total) by mouth daily.  . Cetirizine HCl 10 MG CAPS   . LORazepam (ATIVAN) 0.5 MG tablet TAKE 1 TABLET BY MOUTH EVERY 8 HOURS AS NEEDED FOR ANXIETY.  . metoprolol tartrate (LOPRESSOR) 25 MG tablet Take 25 mg (1 tablet) TWO hours prior to CT  . Multiple Vitamins-Minerals (MULTIVITAMIN WITH MINERALS) tablet Take 1 tablet by mouth daily.  . Omega 3 1200 MG CAPS   . Pseudoephedrine HCl (SUDAFED PO) Take 1 tablet by mouth daily as needed (allergies).   . sildenafil (VIAGRA) 100 MG tablet Take 0.5-1 tablets (50-100 mg total) by mouth daily as needed for erectile dysfunction.     Allergies:   Patient  has no known allergies.   Social History   Socioeconomic History  . Marital status: Married    Spouse name: Not on file  . Number of children: Not on file  . Years of education: Not on file  . Highest education level: Not on file  Occupational History  . Occupation: insurance adj  Tobacco Use  . Smoking status: Former Smoker    Packs/day: 0.50    Years: 20.00    Pack years: 10.00    Types: Cigarettes    Quit date: 01/06/2000    Years since quitting: 20.0  . Smokeless tobacco: Former Systems developer    Types: [place] date: 01/06/2004  Substance and Sexual Activity  . Alcohol use: No     Alcohol/week: 0.0 standard drinks    Comment: occ/ heavy drinker in past   . Drug use: No  . Sexual activity: Yes    Partners: Female  Other Topics Concern  . Not on file  Social History Narrative   Married, Location manager   Social Determinants of Health   Financial Resource Strain: Not on file  Food Insecurity: Not on file  Transportation Needs: Not on file  Physical Activity: Not on file  Stress: Not on file  Social Connections: Not on file     Family History: The patient's family history includes Alcohol abuse in his father; Breast cancer in his mother; Heart attack in his father; Hyperlipidemia in his father; Hypertension in his father and mother; Stroke in his father.  ROS:   Please see the history of present illness.     All other systems reviewed and are negative.  EKGs/Labs/Other Studies Reviewed:    The following studies were reviewed today:   EKG:  EKG is not ordered today.  The ekg ordered at prior clinic visit demonstrates NSR, right axis deviation, rate 59  Recent Labs: 08/28/2019: ALT 31; Hemoglobin 14.7; Platelets 195.0 01/11/2020: BUN 14; Creatinine, Ser 1.00; Potassium 4.2; Sodium 139  Recent Lipid Panel    Component Value Date/Time   CHOL 209 (H) 08/28/2019 0849   TRIG 153.0 (H) 08/28/2019 0849   HDL 41.70 08/28/2019 0849   CHOLHDL 5 08/28/2019 0849   VLDL 30.6 08/28/2019 0849   LDLCALC 136 (H) 08/28/2019 0849   LDLDIRECT 141.0 10/21/2016 0833    Physical Exam:    VS:  BP 124/82   Pulse 65   Ht 5\' 10"  (1.778 m)   Wt 229 lb 9.6 oz (104.1 kg)   SpO2 97%   BMI 32.94 kg/m     Wt Readings from Last 3 Encounters:  02/01/20 229 lb 9.6 oz (104.1 kg)  10/27/19 224 lb 3.2 oz (101.7 kg)  08/31/19 223 lb (101.2 kg)     GEN:  Well nourished, well developed in no acute distress HEENT: Normal NECK: No JVD; No carotid bruits LYMPHATICS: No lymphadenopathy CARDIAC: RRR, no murmurs, rubs,  gallops RESPIRATORY:  Clear to auscultation without rales, wheezing or rhonchi  ABDOMEN: Soft, non-tender, non-distended MUSCULOSKELETAL:  No edema; No deformity  SKIN: Warm and dry NEUROLOGIC:  Alert and oriented x 3 PSYCHIATRIC:  Normal affect   ASSESSMENT:    1. Coronary artery disease involving native coronary artery of native heart without angina pectoris   2. Hyperlipidemia, unspecified hyperlipidemia type   3. Lightheadedness   4. Leg pain, bilateral    PLAN:    CAD: Calcium score 155 (63rd percentile) on 10/03/2019.  He  reports atypical chest pain and exertional dyspnea.  Coronary CTA was done on 01/18/2020, which showed calcium score 165 (62nd percentile), nonobstructive CAD with mixed plaque in mid LAD causing 24 to 49% stenosis, calcified plaque in proximal LAD causing 0 to 24% stenosis, noncalcified plaque in proximal RCA causing 25 to 49% stenosis, calcified plaque in proximal and mid RCA causing 0 to 24% stenosis.  Echocardiogram on 11/03/2019 showed normal biventricular function, no significant valvular disease. -Continue atorvastatin 40 mg daily, will check lipid panel.  Goal LDL less than 70.  T2DM: Currently diet controlled, last A1c 6.4% on 08/28/2019.  Hyperlipidemia:  LDL 136 on 08/28/2019, started on atorvastatin 40 mg daily on 10/04/2019.   Will recheck lipid panel  Lightheadedness: Description suggest vertigo vs orthostasis, encouraged to stay hydrated.  Not on antihypertensives.  Orthostatics in clinic showed drop in BP with standing but not meeting criteria for orthostatic hypotension.   Leg pain/numbness: normal ABIs on 11/08/19  RTC in 6 months  Medication Adjustments/Labs and Tests Ordered: Current medicines are reviewed at length with the patient today.  Concerns regarding medicines are outlined above.  Orders Placed This Encounter  Procedures  . Lipid panel   No orders of the defined types were placed in this encounter.   Patient Instructions   Medication Instructions:  No Changes In Medications at this time.  *If you need a refill on your cardiac medications before your next appointment, please call your pharmacy*  Lab Work: LIPID PANEL TODAY  If you have labs (blood work) drawn today and your tests are completely normal, you will receive your results only by: Marland Kitchen MyChart Message (if you have MyChart) OR . A paper copy in the mail If you have any lab test that is abnormal or we need to change your treatment, we will call you to review the results.  Follow-Up: At [facility], you and your health needs are our priority.  As part of our continuing mission to provide you with exceptional heart care, we have created designated Provider Care Teams.  These Care Teams include your primary Cardiologist (physician) and Advanced Practice Providers (APPs -  Physician Assistants and Nurse Practitioners) who all work together to provide you with the care you need, when you need it.  Your next appointment:   6 month(s)  The format for your next appointment:   In Person  Provider:   Dr. Gardiner Rhyme     Signed, Donato Heinz, MD  02/01/2020 3:49 PM    [place]

## 2020-02-01 ENCOUNTER — Encounter: Payer: Self-pay | Admitting: Cardiology

## 2020-02-01 ENCOUNTER — Other Ambulatory Visit: Payer: Self-pay

## 2020-02-01 ENCOUNTER — Ambulatory Visit: Payer: Medicare HMO | Admitting: Cardiology

## 2020-02-01 VITALS — BP 124/82 | HR 65 | Ht 70.0 in | Wt 229.6 lb

## 2020-02-01 DIAGNOSIS — R42 Dizziness and giddiness: Secondary | ICD-10-CM

## 2020-02-01 DIAGNOSIS — E785 Hyperlipidemia, unspecified: Secondary | ICD-10-CM | POA: Diagnosis not present

## 2020-02-01 DIAGNOSIS — M79604 Pain in right leg: Secondary | ICD-10-CM | POA: Diagnosis not present

## 2020-02-01 DIAGNOSIS — I2510 Atherosclerotic heart disease of native coronary artery without angina pectoris: Secondary | ICD-10-CM | POA: Diagnosis not present

## 2020-02-01 DIAGNOSIS — M79605 Pain in left leg: Secondary | ICD-10-CM

## 2020-02-01 LAB — LIPID PANEL
Chol/HDL Ratio: 3.5 ratio (ref 0.0–5.0)
Cholesterol, Total: 142 mg/dL (ref 100–199)
HDL: 41 mg/dL (ref >39)
LDL Chol Calc (NIH): 69 mg/dL (ref 0–99)
Triglycerides: 188 mg/dL — ABNORMAL HIGH (ref 0–149)
VLDL Cholesterol Cal: 32 mg/dL (ref 5–40)

## 2020-02-01 NOTE — Patient Instructions (Signed)
Medication Instructions:  No Changes In Medications at this time.  *If you need a refill on your cardiac medications before your next appointment, please call your pharmacy*  Lab Work: LIPID PANEL TODAY  If you have labs (blood work) drawn today and your tests are completely normal, you will receive your results only by: Marland Kitchen MyChart Message (if you have MyChart) OR . A paper copy in the mail If you have any lab test that is abnormal or we need to change your treatment, we will call you to review the results.  Follow-Up: At [facility], you and your health needs are our priority.  As part of our continuing mission to provide you with exceptional heart care, we have created designated Provider Care Teams.  These Care Teams include your primary Cardiologist (physician) and Advanced Practice Providers (APPs -  Physician Assistants and Nurse Practitioners) who all work together to provide you with the care you need, when you need it.  Your next appointment:   6 month(s)  The format for your next appointment:   In Person  Provider:   Dr. Gardiner Rhyme

## 2020-03-02 DIAGNOSIS — Z1212 Encounter for screening for malignant neoplasm of rectum: Secondary | ICD-10-CM | POA: Diagnosis not present

## 2020-03-02 DIAGNOSIS — Z1211 Encounter for screening for malignant neoplasm of colon: Secondary | ICD-10-CM | POA: Diagnosis not present

## 2020-03-12 ENCOUNTER — Other Ambulatory Visit: Payer: Self-pay | Admitting: Family Medicine

## 2020-03-12 ENCOUNTER — Telehealth: Payer: Self-pay

## 2020-03-12 DIAGNOSIS — R195 Other fecal abnormalities: Secondary | ICD-10-CM

## 2020-03-12 DIAGNOSIS — Z1211 Encounter for screening for malignant neoplasm of colon: Secondary | ICD-10-CM

## 2020-03-12 NOTE — Progress Notes (Signed)
F/u + Cologuard

## 2020-03-12 NOTE — Telephone Encounter (Signed)
Jeanine with [facility] said abnormal cologuard was faxed to [facility] on 03/11/20. Case # [id number]. Jeanine said would fax another copy of abnormal cologuard today to make sure DR Copland receives report.sending note to Dr Lorelei Pont and Butch Penny CMA.

## 2020-03-12 NOTE — Telephone Encounter (Signed)
I already sent him a mychart message and put in a GI consult.

## 2020-03-12 NOTE — Telephone Encounter (Signed)
Results are in Dr. Lillie Fragmin office in box for review.

## 2020-04-23 DIAGNOSIS — H15102 Unspecified episcleritis, left eye: Secondary | ICD-10-CM | POA: Diagnosis not present

## 2020-05-02 DIAGNOSIS — H11122 Conjunctival concretions, left eye: Secondary | ICD-10-CM | POA: Diagnosis not present

## 2020-05-02 DIAGNOSIS — H16122 Filamentary keratitis, left eye: Secondary | ICD-10-CM | POA: Diagnosis not present

## 2020-05-07 ENCOUNTER — Ambulatory Visit (AMBULATORY_SURGERY_CENTER): Payer: Medicare HMO | Admitting: *Deleted

## 2020-05-07 ENCOUNTER — Other Ambulatory Visit: Payer: Self-pay

## 2020-05-07 VITALS — Ht 70.0 in | Wt 223.0 lb

## 2020-05-07 DIAGNOSIS — R195 Other fecal abnormalities: Secondary | ICD-10-CM

## 2020-05-07 MED ORDER — NA SULFATE-K SULFATE-MG SULF 17.5-3.13-1.6 GM/177ML PO SOLN: ORAL | 0 refills | Status: DC

## 2020-05-07 NOTE — Progress Notes (Signed)
Patient's pre-visit was done today over the phone with the patient and wife due to COVID-19 pandemic. Name,DOB and address verified. Insurance verified. Patient denies any allergies to Eggs and Soy. Patient denies any problems with anesthesia/sedation. Patient denies taking diet pills or blood thinners. Packet of Prep instructions mailed to patient including a copy of a consent form-pt is aware. Patient understands to call us back with any questions or concerns. Patient is aware of our care-partner policy and Covid-19 safety protocol. EMMI education assigned to the patient for the procedure, sent to MyChart. The patient is COVID-19 vaccinated, per patient.  

## 2020-05-13 DIAGNOSIS — H11122 Conjunctival concretions, left eye: Secondary | ICD-10-CM | POA: Diagnosis not present

## 2020-05-13 DIAGNOSIS — H5712 Ocular pain, left eye: Secondary | ICD-10-CM | POA: Diagnosis not present

## 2020-05-15 ENCOUNTER — Telehealth: Payer: Self-pay

## 2020-05-15 DIAGNOSIS — Z006 Encounter for examination for normal comparison and control in clinical research program: Secondary | ICD-10-CM

## 2020-05-15 NOTE — Telephone Encounter (Signed)
Called patient for 90 day Identify phone call pt stated he is not having anymore cardiac symptoms, I reminded the patient that we would be calling him back around January for a year follow up phone call.

## 2020-05-16 ENCOUNTER — Other Ambulatory Visit: Payer: Self-pay | Admitting: Family Medicine

## 2020-05-16 NOTE — Telephone Encounter (Signed)
Last office visit 08/31/2019 for Welcome to Medicare.  Last refilled 10/16/2019 for #30 with 2 refills.  No future appointments with PCP.

## 2020-05-21 ENCOUNTER — Encounter: Payer: Self-pay | Admitting: Internal Medicine

## 2020-05-21 ENCOUNTER — Other Ambulatory Visit: Payer: Self-pay

## 2020-05-21 ENCOUNTER — Ambulatory Visit (AMBULATORY_SURGERY_CENTER): Payer: Medicare HMO | Admitting: Internal Medicine

## 2020-05-21 VITALS — BP 115/74 | HR 53 | Temp 98.9°F | Resp 9 | Ht 70.0 in | Wt 223.0 lb

## 2020-05-21 DIAGNOSIS — E119 Type 2 diabetes mellitus without complications: Secondary | ICD-10-CM | POA: Diagnosis not present

## 2020-05-21 DIAGNOSIS — K5730 Diverticulosis of large intestine without perforation or abscess without bleeding: Secondary | ICD-10-CM

## 2020-05-21 DIAGNOSIS — D123 Benign neoplasm of transverse colon: Secondary | ICD-10-CM

## 2020-05-21 DIAGNOSIS — R195 Other fecal abnormalities: Secondary | ICD-10-CM | POA: Diagnosis not present

## 2020-05-21 DIAGNOSIS — Z1211 Encounter for screening for malignant neoplasm of colon: Secondary | ICD-10-CM | POA: Diagnosis not present

## 2020-05-21 DIAGNOSIS — D122 Benign neoplasm of ascending colon: Secondary | ICD-10-CM | POA: Diagnosis not present

## 2020-05-21 DIAGNOSIS — R69 Illness, unspecified: Secondary | ICD-10-CM | POA: Diagnosis not present

## 2020-05-21 MED ORDER — SODIUM CHLORIDE 0.9 % IV SOLN: 500.0000 mL | Freq: Once | INTRAVENOUS | Status: DC

## 2020-05-21 NOTE — Progress Notes (Signed)
Called to room to assist during endoscopic procedure.  Patient ID and intended procedure confirmed with present staff. Received instructions for my participation in the procedure from the performing physician.  

## 2020-05-21 NOTE — Progress Notes (Signed)
Report to PACU, RN, vss, BBS= Clear.  

## 2020-05-21 NOTE — Patient Instructions (Signed)
Handouts Provided:  Polyps and Diverticulosis ° °YOU HAD AN ENDOSCOPIC PROCEDURE TODAY AT THE [facility]:   Refer to the procedure report that was given to you for any specific questions about what was found during the examination.  If the procedure report does not answer your questions, please call your gastroenterologist to clarify.  If you requested that your care partner not be given the details of your procedure findings, then the procedure report has been included in a sealed envelope for you to review at your convenience later. ° °YOU SHOULD EXPECT: Some feelings of bloating in the abdomen. Passage of more gas than usual.  Walking can help get rid of the air that was put into your GI tract during the procedure and reduce the bloating. If you had a lower endoscopy (such as a colonoscopy or flexible sigmoidoscopy) you may notice spotting of blood in your stool or on the toilet paper. If you underwent a bowel prep for your procedure, you may not have a normal bowel movement for a few days. ° °Please Note:  You might notice some irritation and congestion in your nose or some drainage.  This is from the oxygen used during your procedure.  There is no need for concern and it should clear up in a day or so. ° °SYMPTOMS TO REPORT IMMEDIATELY: ° °Following lower endoscopy (colonoscopy or flexible sigmoidoscopy): ° Excessive amounts of blood in the stool ° Significant tenderness or worsening of abdominal pains ° Swelling of the abdomen that is new, acute ° Fever of 100°F or higher ° °For urgent or emergent issues, a gastroenterologist can be reached at any hour by calling [phone]. °Do not use MyChart messaging for urgent concerns.  ° ° °DIET:  We do recommend a small meal at first, but then you may proceed to your regular diet.  Drink plenty of fluids but you should avoid alcoholic beverages for 24 hours. ° °ACTIVITY:  You should plan to take it easy for the rest of today and you should NOT DRIVE  or use heavy machinery until tomorrow (because of the sedation medicines used during the test).   ° °FOLLOW UP: °Our staff will call the number listed on your records 48-72 hours following your procedure to check on you and address any questions or concerns that you may have regarding the information given to you following your procedure. If we do not reach you, we will leave a message.  We will attempt to reach you two times.  During this call, we will ask if you have developed any symptoms of COVID 19. If you develop any symptoms (ie: fever, flu-like symptoms, shortness of breath, cough etc.) before then, please call [phone].  If you test positive for Covid 19 in the 2 weeks post procedure, please call and report this information to us.   ° °If any biopsies were taken you will be contacted by phone or by letter within the next 1-3 weeks.  Please call us at [phone] if you have not heard about the biopsies in 3 weeks.  ° ° °SIGNATURES/CONFIDENTIALITY: °You and/or your care partner have signed paperwork which will be entered into your electronic medical record.  These signatures attest to the fact that that the information above on your After Visit Summary has been reviewed and is understood.  Full responsibility of the confidentiality of this discharge information lies with you and/or your care-partner. ° °

## 2020-05-21 NOTE — Progress Notes (Signed)
Pt's states no medical or surgical changes since previsit or office visit. 

## 2020-05-21 NOTE — Op Note (Addendum)
[place] Patient Name: Charles [name] Procedure Date: 05/21/2020 8:02 AM MRN: [id number] Endoscopist: Jerene Bears , MD Age: 65 Referring MD:  Date of Birth: 08/04/1955 Gender: Male Account #: [id number] Procedure:                Colonoscopy Indications:              Positive Cologuard test Medicines:                Monitored Anesthesia Care Procedure:                Pre-Anesthesia Assessment:                           - Prior to the procedure, a History and Physical                            was performed, and patient medications and                            allergies were reviewed. The patient's tolerance of                            previous anesthesia was also reviewed. The risks                            and benefits of the procedure and the sedation                            options and risks were discussed with the patient.                            All questions were answered, and informed consent                            was obtained. Prior Anticoagulants: The patient has                            taken no previous anticoagulant or antiplatelet                            agents except for aspirin. ASA Grade Assessment: II                            - A patient with mild systemic disease. After                            reviewing the risks and benefits, the patient was                            deemed in satisfactory condition to undergo the                            procedure.  After obtaining informed consent, the colonoscope                            was passed under direct vision. Throughout the                            procedure, the patient's blood pressure, pulse, and                            oxygen saturations were monitored continuously. The                            Olympus CF-HQ190 [id number]) Colonoscope was                            introduced through the anus and advanced to the                             cecum, identified by appendiceal orifice and                            ileocecal valve. The colonoscopy was performed                            without difficulty. The patient tolerated the                            procedure well. The quality of the bowel                            preparation was good. The ileocecal valve,                            appendiceal orifice, and rectum were photographed. Scope In: 8:05:29 AM Scope Out: 8:30:27 AM Scope Withdrawal Time: 0 hours 23 minutes 26 seconds  Total Procedure Duration: 0 hours 24 minutes 58 seconds  Findings:                 The digital rectal exam was normal.                           A 4 mm polyp was found in the ascending colon. The                            polyp was sessile. The polyp was removed with a                            cold snare. Resection and retrieval were complete.                           A 25 mm polyp was found in the proximal transverse                            colon. The polyp was sessile.  The polyp was removed                            with a piecemeal technique using a cold snare.                            Resection and retrieval were complete. Area                            immediately distal to the polypectomy site was                            tattooed with an injection of 2 mL of Spot (carbon                            black).                           Multiple small and large-mouthed diverticula were                            found from cecum to sigmoid colon.                           The retroflexed view of the distal rectum and anal                            verge was normal and showed no anal or rectal                            abnormalities. Complications:            No immediate complications. Estimated Blood Loss:     Estimated blood loss was minimal. Impression:               - One 4 mm polyp in the ascending colon, removed                            with a cold snare. Resected and  retrieved.                           - One 25 mm polyp in the proximal transverse colon,                            removed piecemeal using a cold snare. Resected and                            retrieved. Tattooed.                           - Diverticulosis from cecum to sigmoid colon.                           - The distal rectum and anal verge are normal on  retroflexion view. Recommendation:           - Patient has a contact number available for                            emergencies. The signs and symptoms of potential                            delayed complications were discussed with the                            patient. Return to normal activities tomorrow.                            Written discharge instructions were provided to the                            patient.                           - Resume previous diet.                           - Continue present medications.                           - Await pathology results.                           - Repeat colonoscopy is recommended for                            surveillance. The colonoscopy date will be                            determined after pathology results from today's                            exam become available for review. Jerene Bears, MD 05/21/2020 8:37:08 AM This report has been signed electronically.

## 2020-05-23 ENCOUNTER — Telehealth: Payer: Self-pay

## 2020-05-23 NOTE — Telephone Encounter (Signed)
  Follow up Call-  Call back number 05/21/2020  Post procedure Call Back phone  # [phone]  Permission to leave phone message Yes  Some recent data might be hidden     Patient questions:  Do you have a fever, pain , or abdominal swelling? No. Pain Score  0 *  Have you tolerated food without any problems? Yes.    Have you been able to return to your normal activities? Yes.    Do you have any questions about your discharge instructions: Diet   No. Medications  No. Follow up visit  No.  Do you have questions or concerns about your Care? No.  Actions: * If pain score is 4 or above: No action needed, pain <4.   1. Have you developed a fever since your procedure? No   2.   Have you had an respiratory symptoms (SOB or cough) since your procedure? No   3.   Have you tested positive for COVID 19 since your procedure no   4.   Have you had any family members/close contacts diagnosed with the COVID 19 since your procedure?  No    If yes to any of these questions please route to Joylene John, RN and Joella Prince, RN

## 2020-05-30 ENCOUNTER — Other Ambulatory Visit: Payer: Self-pay | Admitting: Internal Medicine

## 2020-05-30 DIAGNOSIS — C884 Extranodal marginal zone B-cell lymphoma of mucosa-associated lymphoid tissue [MALT-lymphoma]: Secondary | ICD-10-CM

## 2020-05-30 NOTE — Progress Notes (Signed)
Please schedule with Dr. Alvy Bimler

## 2020-06-04 ENCOUNTER — Telehealth: Payer: Self-pay | Admitting: Hematology and Oncology

## 2020-06-04 NOTE — Telephone Encounter (Signed)
Received a new pt referral from Dr. Hilarie Fredrickson for extranodal B-cell lymphoma. Mr. Charles [name] has been scheduled to see Dr. Alvy Bimler on 6/6 at 3pm. Appt date and time has been given to the pt' wife. Aware to arrive 30 minutes early.

## 2020-06-07 ENCOUNTER — Telehealth: Payer: Self-pay

## 2020-06-07 NOTE — Telephone Encounter (Signed)
Called back and given below message offering appt today. He is unable to come today and will appt as scheduled.

## 2020-06-07 NOTE — Telephone Encounter (Signed)
Called and left a message offering appt today at 1 pm, arrival at 1230. Will move appt to today. Ask him to call the office back.

## 2020-06-10 ENCOUNTER — Encounter: Payer: Self-pay | Admitting: Hematology and Oncology

## 2020-06-10 ENCOUNTER — Inpatient Hospital Stay: Payer: Medicare HMO | Attending: Hematology and Oncology | Admitting: Hematology and Oncology

## 2020-06-10 ENCOUNTER — Other Ambulatory Visit: Payer: Self-pay

## 2020-06-10 DIAGNOSIS — Z7982 Long term (current) use of aspirin: Secondary | ICD-10-CM | POA: Insufficient documentation

## 2020-06-10 DIAGNOSIS — Z79899 Other long term (current) drug therapy: Secondary | ICD-10-CM | POA: Insufficient documentation

## 2020-06-10 DIAGNOSIS — Z85828 Personal history of other malignant neoplasm of skin: Secondary | ICD-10-CM | POA: Diagnosis not present

## 2020-06-10 DIAGNOSIS — Z87891 Personal history of nicotine dependence: Secondary | ICD-10-CM | POA: Diagnosis not present

## 2020-06-10 DIAGNOSIS — C884 Extranodal marginal zone B-cell lymphoma of mucosa-associated lymphoid tissue [MALT-lymphoma]: Secondary | ICD-10-CM | POA: Insufficient documentation

## 2020-06-10 DIAGNOSIS — C8580 Other specified types of non-Hodgkin lymphoma, unspecified site: Secondary | ICD-10-CM

## 2020-06-10 DIAGNOSIS — E119 Type 2 diabetes mellitus without complications: Secondary | ICD-10-CM | POA: Diagnosis not present

## 2020-06-10 HISTORY — DX: Other specified types of non-hodgkin lymphoma, unspecified site: C85.80

## 2020-06-10 NOTE — Assessment & Plan Note (Signed)
The patient has significant cardiovascular risk He had moderate high coronary calcium score He has multiple risk factors including diabetes, hypercholesterolemia as well as history of smoking We discussed the importance of aggressive lifestyle changes including dietary modification and weight loss

## 2020-06-10 NOTE — Progress Notes (Signed)
[facility] CONSULT NOTE  Patient Care Team: Owens Loffler, MD as PCP - General (Family Medicine)  ASSESSMENT & PLAN:  Marginal zone B-cell lymphoma ([place]) I will get his case presented at the next hematology tumor board tomorrow I see that the pathology report has been amended to low-grade lymphoma, possible marginal zone lymphoma We will review the pathology slides tomorrow We discussed the importance of staging I will order a PET CT scan to be done next week and we will bring him back to discuss test results If PET CT scan is normal and the tumor was completely resected, observation would be consideration Another possibility would be single agent rituximab   History of skin cancer He could not remember the type of skin cancer on his right temple The patient plays golf 3 times a week I am concerned about risk of cancer recurrence especially with recent diagnosis of lymphoma We discussed the importance of aggressive skin protection and avoidance of excessive sun exposure if possible  Controlled type 2 diabetes mellitus without complication, without long-term current use of insulin ([place]) The patient has significant cardiovascular risk He had moderate high coronary calcium score He has multiple risk factors including diabetes, hypercholesterolemia as well as history of smoking We discussed the importance of aggressive lifestyle changes including dietary modification and weight loss   Orders Placed This Encounter  Procedures  . NM PET Image Initial (PI) Skull Base To Thigh    Standing Status:   Future    Standing Expiration Date:   06/10/2021    Order Specific Question:   If indicated for the ordered procedure, I authorize the administration of a radiopharmaceutical per Radiology protocol    Answer:   Yes    Order Specific Question:   Preferred imaging location?    Answer:   [name]    The total time spent in the appointment was 60 minutes encounter with  patients including review of chart and various tests results, discussions about plan of care and coordination of care plan   All questions were answered. The patient knows to call the clinic with any problems, questions or concerns. No barriers to learning was detected.  [name], MD 6/6/20224:10 PM  CHIEF COMPLAINTS/PURPOSE OF CONSULTATION:  Lymphoma of the colon, suspect marginal zone lymphoma He is here accompanied by his wife, Charles [name] The patient is a retired Scientist, water quality  HISTORY OF PRESENTING ILLNESS:  Charles [name] 65 y.o. male is here because of recent diagnosis of lymphoma The patient was originally diagnosed with diffuse large B-cell lymphoma However, his pathology was amended to low-grade, potentially extranodal marginal zone lymphoma The purpose of the colonoscopy was part of screening program He has no bowel symptoms such as weight loss, changes in bowel habits or abdominal bloating or nausea He denies history of rectal bleeding He has no lymphadenopathy He has remote history of skin cancer but has not seen a dermatologist for 3 to 4 years  I have reviewed his chart and materials related to his cancer extensively and collaborated history with the patient. Summary of oncologic history is as follows: Oncology History  Marginal zone B-cell lymphoma ([place])  05/21/2020 Procedure   Colonoscopy by Dr. Hilarie Fredrickson - One 4 mm polyp in the ascending colon, removed with a cold snare. Resected and retrieved. - One 25 mm polyp in the proximal transverse colon, removed piecemeal using a cold snare. Resected and retrieved. Tattooed. - Diverticulosis from cecum to sigmoid colon. - The distal rectum  and anal verge are normal on retroflexion view.   05/21/2020 Pathology Results   1. Surgical [P], colon, ascending, polyp (1) - TUBULAR ADENOMA. - NO HIGH GRADE DYSPLASIA OR CARCINOMA. 2. Surgical [P], colon, transverse, polyp (1) - ATYPICAL LYMPHOID PROLIFERATION. - SEE MICROSCOPIC  DESCRIPTION. Microscopic Comment 2. The sections show a dense, diffuse and relatively monomorphic lymphoid infiltrate involving the lamina propria and submucosa and mostly characterized by small to medium sized lymphocytes with round to slightly irregular nuclei and moderately abundant clear cytoplasm imparting a monocytoid appearance. The infiltrate displays scattered small foci of lymphoepithelial lesions and occasionally surround very small "naked" germinal centers. A battery of immunohistochemical stains was performed and shows that the lymphoid infiltrate is predominantly composed of B cells as seen with CD20 associated with diffuse positivity for Bcl-2 and CD43. No significant CD10 or cyclin D1 positivity is identified. BCL6 highlights scattering of small positive clusters correlating with previously described germinal centers. Ki-67 shows very low expression (less than 10%). In situ hybridization for kappa and lambda highlight a polyclonal superficial plasma cell component. The findings are highly suspicious for involvement by low-grade B-cell lymphoma particularly extranodal marginal zone lymphoma. FISH and gene rearrangement study studies will be performed and the results reported in an addendum. Dr. Gari Crown reviewed this part and cocnurs. Claudette Laws MD Pathologist, Electronic Signature (Case signed 05/27/2020)      MEDICAL HISTORY:  Past Medical History:  Diagnosis Date  . Anxiety   . Controlled type 2 diabetes mellitus without complication, without long-term current use of insulin ([place])   . Diverticulitis   . Generalized anxiety disorder 10/21/2014  . GERD (gastroesophageal reflux disease)     SURGICAL HISTORY: Past Surgical History:  Procedure Laterality Date  . ACHILLES TENDON REPAIR  2001  . BICEPS TENDON REPAIR Left 2014  . COLONOSCOPY  2012   polyps-[place]  . HEMORROIDECTOMY  2012  . ROTATOR CUFF REPAIR Left 2014  . TOTAL HIP ARTHROPLASTY Right 12/07/2014   Procedure: RIGHT  TOTAL HIP ARTHROPLASTY ANTERIOR APPROACH;  Surgeon: Mcarthur Rossetti, MD;  Location: [facility];  Service: Orthopedics;  Laterality: Right;    SOCIAL HISTORY: Social History   Socioeconomic History  . Marital status: Married    Spouse name: Lovey Newcomer  . Number of children: 1  . Years of education: Not on file  . Highest education level: Not on file  Occupational History  . Occupation: insurance adj retired  Tobacco Use  . Smoking status: Former Smoker    Packs/day: 0.50    Years: 20.00    Pack years: 10.00    Types: Cigarettes    Quit date: 01/06/2000    Years since quitting: 20.4  . Smokeless tobacco: Former Systems developer    Types: Sharp[place] date: 01/06/2004  Vaping Use  . Vaping Use: Never used  Substance and Sexual Activity  . Alcohol use: No    Alcohol/week: 0.0 standard drinks    Comment: occ/ heavy drinker in past   . Drug use: No  . Sexual activity: Yes    Partners: Female  Other Topics Concern  . Not on file  Social History Narrative   Married, Location manager   Social Determinants of Health   Financial Resource Strain: Not on file  Food Insecurity: Not on file  Transportation Needs: Not on file  Physical Activity: Not on file  Stress: Not on file  Social Connections: Not on file  Intimate Partner  Violence: Not on file    FAMILY HISTORY: Family History  Problem Relation Age of Onset  . Alcohol abuse Father   . Heart attack Father   . Hyperlipidemia Father   . Stroke Father   . Hypertension Father   . Breast cancer Mother 74  . Hypertension Mother   . Colon cancer Neg Hx   . Colon polyps Neg Hx   . Esophageal cancer Neg Hx   . Rectal cancer Neg Hx   . Stomach cancer Neg Hx     ALLERGIES:  has No Known Allergies.  MEDICATIONS:  Current Outpatient Medications  Medication Sig Dispense Refill  . LORazepam (ATIVAN) 0.5 MG tablet TAKE 1 TABLET BY MOUTH EVERY 8 HOURS AS NEEDED FOR ANXIETY 30 tablet 2  . aspirin  EC 81 MG tablet Take 81 mg by mouth daily.    Marland Kitchen atorvastatin (LIPITOR) 40 MG tablet Take 1 tablet (40 mg total) by mouth daily. 90 tablet 3  . Cetirizine HCl 10 MG CAPS     . Flaxseed, Linseed, (FLAX SEED OIL PO) Take by mouth.    . Multiple Vitamins-Minerals (MULTIVITAMIN WITH MINERALS) tablet Take 1 tablet by mouth daily.    . Pseudoephedrine HCl (SUDAFED PO) Take 1 tablet by mouth daily as needed (allergies).     . sildenafil (VIAGRA) 100 MG tablet Take 0.5-1 tablets (50-100 mg total) by mouth daily as needed for erectile dysfunction. 5 tablet 11   No current facility-administered medications for this visit.    REVIEW OF SYSTEMS:   Constitutional: Denies fevers, chills or abnormal night sweats Eyes: Denies blurriness of vision, double vision or watery eyes Ears, nose, mouth, throat, and face: Denies mucositis or sore throat Respiratory: Denies cough, dyspnea or wheezes Cardiovascular: Denies palpitation, chest discomfort or lower extremity swelling Gastrointestinal:  Denies nausea, heartburn or change in bowel habits Skin: Denies abnormal skin rashes Lymphatics: Denies new lymphadenopathy or easy bruising Neurological:Denies numbness, tingling or new weaknesses Behavioral/Psych: Mood is stable, no new changes  All other systems were reviewed with the patient and are negative.  PHYSICAL EXAMINATION: ECOG PERFORMANCE STATUS: 0 - Asymptomatic  Vitals:   06/10/20 1421  BP: 133/79  Pulse: 60  Resp: 18  Temp: 98.1 F (36.7 C)  SpO2: 98%   Filed Weights   06/10/20 1421  Weight: 227 lb (103 kg)    GENERAL:alert, no distress and comfortable.  Central obesity is noted SKIN: skin color, texture, turgor are normal, no rashes or significant lesions EYES: normal, conjunctiva are pink and non-injected, sclera clear OROPHARYNX:no exudate, no erythema and lips, buccal mucosa, and tongue normal  NECK: supple, thyroid normal size, non-tender, without nodularity LYMPH:  no palpable  lymphadenopathy in the cervical, axillary or inguinal LUNGS: clear to auscultation and percussion with normal breathing effort HEART: regular rate & rhythm and no murmurs and no lower extremity edema ABDOMEN:abdomen soft, non-tender and normal bowel sounds Musculoskeletal:no cyanosis of digits and no clubbing  PSYCH: alert & oriented x 3 with fluent speech NEURO: no focal motor/sensory deficits  LABORATORY DATA:  I have reviewed the data as listed Lab Results  Component Value Date   WBC 5.0 08/28/2019   HGB 14.7 08/28/2019   HCT 43.2 08/28/2019   MCV 91.5 08/28/2019   PLT 195.0 08/28/2019   Recent Labs    08/28/19 0849 01/11/20 1249  NA 139 139  K 4.2 4.2  CL 102 102  CO2 29 25  GLUCOSE 126* 97  BUN 13 14  CREATININE  0.93 1.00  CALCIUM 9.2 9.3  GFRNONAA  --  79  GFRAA  --  91  PROT 6.8  --   ALBUMIN 4.1  --   AST 21  --   ALT 31  --   ALKPHOS 76  --   BILITOT 0.7  --   BILIDIR 0.1  --

## 2020-06-10 NOTE — Assessment & Plan Note (Signed)
I will get his case presented at the next hematology tumor board tomorrow I see that the pathology report has been amended to low-grade lymphoma, possible marginal zone lymphoma We will review the pathology slides tomorrow We discussed the importance of staging I will order a PET CT scan to be done next week and we will bring him back to discuss test results If PET CT scan is normal and the tumor was completely resected, observation would be consideration Another possibility would be single agent rituximab

## 2020-06-10 NOTE — Assessment & Plan Note (Signed)
He could not remember the type of skin cancer on his right temple The patient plays golf 3 times a week I am concerned about risk of cancer recurrence especially with recent diagnosis of lymphoma We discussed the importance of aggressive skin protection and avoidance of excessive sun exposure if possible

## 2020-06-11 ENCOUNTER — Encounter (HOSPITAL_COMMUNITY): Payer: Self-pay

## 2020-06-24 ENCOUNTER — Inpatient Hospital Stay: Payer: Medicare HMO | Admitting: Hematology and Oncology

## 2020-06-24 ENCOUNTER — Telehealth: Payer: Self-pay

## 2020-06-24 ENCOUNTER — Encounter: Payer: Self-pay | Admitting: Hematology and Oncology

## 2020-06-24 ENCOUNTER — Other Ambulatory Visit: Payer: Self-pay

## 2020-06-24 ENCOUNTER — Ambulatory Visit (HOSPITAL_COMMUNITY)
Admission: RE | Admit: 2020-06-24 | Discharge: 2020-06-24 | Disposition: A | Discharge status: Home / Self Care | Payer: Medicare HMO | Source: Ambulatory Visit | Attending: Hematology and Oncology | Admitting: Hematology and Oncology

## 2020-06-24 DIAGNOSIS — Z87891 Personal history of nicotine dependence: Secondary | ICD-10-CM | POA: Diagnosis not present

## 2020-06-24 DIAGNOSIS — J320 Chronic maxillary sinusitis: Secondary | ICD-10-CM | POA: Diagnosis not present

## 2020-06-24 DIAGNOSIS — N429 Disorder of prostate, unspecified: Secondary | ICD-10-CM | POA: Insufficient documentation

## 2020-06-24 DIAGNOSIS — Z79899 Other long term (current) drug therapy: Secondary | ICD-10-CM | POA: Diagnosis not present

## 2020-06-24 DIAGNOSIS — Z85828 Personal history of other malignant neoplasm of skin: Secondary | ICD-10-CM

## 2020-06-24 DIAGNOSIS — I2510 Atherosclerotic heart disease of native coronary artery without angina pectoris: Secondary | ICD-10-CM | POA: Insufficient documentation

## 2020-06-24 DIAGNOSIS — C8580 Other specified types of non-Hodgkin lymphoma, unspecified site: Secondary | ICD-10-CM | POA: Insufficient documentation

## 2020-06-24 DIAGNOSIS — I700 Atherosclerosis of aorta: Secondary | ICD-10-CM | POA: Insufficient documentation

## 2020-06-24 DIAGNOSIS — C8330 Diffuse large B-cell lymphoma, unspecified site: Secondary | ICD-10-CM | POA: Diagnosis not present

## 2020-06-24 DIAGNOSIS — C884 Extranodal marginal zone B-cell lymphoma of mucosa-associated lymphoid tissue [MALT-lymphoma]: Secondary | ICD-10-CM | POA: Diagnosis not present

## 2020-06-24 DIAGNOSIS — C969 Malignant neoplasm of lymphoid, hematopoietic and related tissue, unspecified: Secondary | ICD-10-CM | POA: Diagnosis not present

## 2020-06-24 DIAGNOSIS — E119 Type 2 diabetes mellitus without complications: Secondary | ICD-10-CM | POA: Diagnosis not present

## 2020-06-24 DIAGNOSIS — Z7982 Long term (current) use of aspirin: Secondary | ICD-10-CM | POA: Diagnosis not present

## 2020-06-24 LAB — GLUCOSE, CAPILLARY: Glucose-Capillary: 136 mg/dL — ABNORMAL HIGH (ref 70–99)

## 2020-06-24 MED ORDER — FLUDEOXYGLUCOSE F - 18 (FDG) INJECTION
11.0000 | Freq: Once | INTRAVENOUS | Status: AC
  Administered 2020-06-24: 07:00:00 11 via INTRAVENOUS

## 2020-06-24 NOTE — Assessment & Plan Note (Signed)
I have reviewed all test results with the patient and his wife The patient has stage I E marginal zone lymphoma He has no residual disease noted on PET CT scan The patient is likely cured by polypectomy Recommend repeat colonoscopy in 1 year I will see him back in a year for further follow-up He does not need adjuvant treatment

## 2020-06-24 NOTE — Telephone Encounter (Signed)
Faxed referral to [facility] at [phone]. Received confirmation. Called referral line and left info.

## 2020-06-24 NOTE — Telephone Encounter (Signed)
-----   Message from [name], MD sent at 06/24/2020  2:36 PM EDT ----- Pls fax referral to [facility]

## 2020-06-24 NOTE — Assessment & Plan Note (Signed)
His last year PSA was not elevated PET CT scan revealed incidental findings with abnormal uptake on the left lobe of the prostate I recommend urology consult and he is in agreement

## 2020-06-24 NOTE — Progress Notes (Signed)
[facility] OFFICE PROGRESS NOTE  Patient Care Team: Owens Loffler, MD as PCP - General (Family Medicine)  ASSESSMENT & PLAN:  Marginal zone B-cell lymphoma ([place]) I have reviewed all test results with the patient and his wife The patient has stage I E marginal zone lymphoma He has no residual disease noted on PET CT scan The patient is likely cured by polypectomy Recommend repeat colonoscopy in 1 year I will see him back in a year for further follow-up He does not need adjuvant treatment  Prostate disease His last year PSA was not elevated PET CT scan revealed incidental findings with abnormal uptake on the left lobe of the prostate I recommend urology consult and he is in agreement  History of skin cancer He has history of local skin cancer I am concerned about risk of cancer recurrence especially with recent diagnosis of lymphoma We discussed the importance of aggressive skin protection and avoidance of excessive sun exposure if possible  Orders Placed This Encounter  Procedures   Ambulatory referral to Urology    Referral Priority:   Routine    Referral Type:   Consultation    Referral Reason:   Specialty Services Required    Referred to Provider:   Alexis Frock, MD    Requested Specialty:   Urology    Number of Visits Requested:   1    All questions were answered. The patient knows to call the clinic with any problems, questions or concerns. The total time spent in the appointment was 30 minutes encounter with patients including review of chart and various tests results, discussions about plan of care and coordination of care plan   [name], MD 06/24/2020 2:36 PM  INTERVAL HISTORY: Please see below for problem oriented charting. He returns to review test results He feels fine No new symptoms He denies urination problems  SUMMARY OF ONCOLOGIC HISTORY: Oncology History  Marginal zone B-cell lymphoma ([place])  05/21/2020 Procedure   Colonoscopy  by Dr. Hilarie Fredrickson - One 4 mm polyp in the ascending colon, removed with a cold snare. Resected and retrieved. - One 25 mm polyp in the proximal transverse colon, removed piecemeal using a cold snare. Resected and retrieved. Tattooed. - Diverticulosis from cecum to sigmoid colon. - The distal rectum and anal verge are normal on retroflexion view.   05/21/2020 Pathology Results   1. Surgical [P], colon, ascending, polyp (1) - TUBULAR ADENOMA. - NO HIGH GRADE DYSPLASIA OR CARCINOMA. 2. Surgical [P], colon, transverse, polyp (1) - ATYPICAL LYMPHOID PROLIFERATION. - SEE MICROSCOPIC DESCRIPTION. Microscopic Comment 2. The sections show a dense, diffuse and relatively monomorphic lymphoid infiltrate involving the lamina propria and submucosa and mostly characterized by small to medium sized lymphocytes with round to slightly irregular nuclei and moderately abundant clear cytoplasm imparting a monocytoid appearance. The infiltrate displays scattered small foci of lymphoepithelial lesions and occasionally surround very small "naked" germinal centers. A battery of immunohistochemical stains was performed and shows that the lymphoid infiltrate is predominantly composed of B cells as seen with CD20 associated with diffuse positivity for Bcl-2 and CD43. No significant CD10 or cyclin D1 positivity is identified. BCL6 highlights scattering of small positive clusters correlating with previously described germinal centers. Ki-67 shows very low expression (less than 10%). In situ hybridization for kappa and lambda highlight a polyclonal superficial plasma cell component. The findings are highly suspicious for involvement by low-grade B-cell lymphoma particularly extranodal marginal zone lymphoma. FISH and gene rearrangement study studies will be performed  and the results reported in an addendum. Dr. Gari Crown reviewed this part and cocnurs. Claudette Laws MD Pathologist, Electronic Signature (Case signed 05/27/2020)     06/24/2020 PET scan   No signs of nodal disease in the neck, chest, abdomen or pelvis.   Focal area of uptake in the LEFT hemi prostate, nonspecific but raising the question of prostate neoplasm. Urologic consultation is suggested for further evaluation.   Aortic atherosclerosis and coronary artery disease.   Maxillary sinus disease.   Aortic Atherosclerosis (ICD10-I70.0).   06/24/2020 Cancer Staging   Staging form: Hodgkin and Non-Hodgkin Lymphoma, AJCC 8th Edition - Clinical stage from 06/24/2020: Stage IE (Marginal zone lymphoma) - Signed by [name], MD on 06/24/2020  Stage prefix: Initial diagnosis      REVIEW OF SYSTEMS:   Constitutional: Denies fevers, chills or abnormal weight loss Eyes: Denies blurriness of vision Ears, nose, mouth, throat, and face: Denies mucositis or sore throat Respiratory: Denies cough, dyspnea or wheezes Cardiovascular: Denies palpitation, chest discomfort or lower extremity swelling Gastrointestinal:  Denies nausea, heartburn or change in bowel habits Skin: Denies abnormal skin rashes Lymphatics: Denies new lymphadenopathy or easy bruising Neurological:Denies numbness, tingling or new weaknesses Behavioral/Psych: Mood is stable, no new changes  All other systems were reviewed with the patient and are negative.  I have reviewed the past medical history, past surgical history, social history and family history with the patient and they are unchanged from previous note.  ALLERGIES:  has No Known Allergies.  MEDICATIONS:  Current Outpatient Medications  Medication Sig Dispense Refill   LORazepam (ATIVAN) 0.5 MG tablet TAKE 1 TABLET BY MOUTH EVERY 8 HOURS AS NEEDED FOR ANXIETY 30 tablet 2   aspirin EC 81 MG tablet Take 81 mg by mouth daily.     atorvastatin (LIPITOR) 40 MG tablet Take 1 tablet (40 mg total) by mouth daily. 90 tablet 3   Cetirizine HCl 10 MG CAPS      Flaxseed, Linseed, (FLAX SEED OIL PO) Take by mouth.     Multiple  Vitamins-Minerals (MULTIVITAMIN WITH MINERALS) tablet Take 1 tablet by mouth daily.     Pseudoephedrine HCl (SUDAFED PO) Take 1 tablet by mouth daily as needed (allergies).      sildenafil (VIAGRA) 100 MG tablet Take 0.5-1 tablets (50-100 mg total) by mouth daily as needed for erectile dysfunction. 5 tablet 11   No current facility-administered medications for this visit.    PHYSICAL EXAMINATION: ECOG PERFORMANCE STATUS: 0 - Asymptomatic  Vitals:   06/24/20 1318  BP: (!) 159/80  Pulse: 66  Resp: 13  Temp: 97.9 F (36.6 C)  SpO2: 99%   Filed Weights   06/24/20 1318  Weight: 228 lb 12.8 oz (103.8 kg)    GENERAL:alert, no distress and comfortable SKIN: skin color, texture, turgor are normal, no rashes or significant lesions EYES: normal, Conjunctiva are pink and non-injected, sclera clear OROPHARYNX:no exudate, no erythema and lips, buccal mucosa, and tongue normal  NECK: supple, thyroid normal size, non-tender, without nodularity LYMPH:  no palpable lymphadenopathy in the cervical, axillary or inguinal LUNGS: clear to auscultation and percussion with normal breathing effort HEART: regular rate & rhythm and no murmurs and no lower extremity edema ABDOMEN:abdomen soft, non-tender and normal bowel sounds Musculoskeletal:no cyanosis of digits and no clubbing  NEURO: alert & oriented x 3 with fluent speech, no focal motor/sensory deficits  LABORATORY DATA:  I have reviewed the data as listed    Component Value Date/Time   NA 139 01/11/2020 1249  K 4.2 01/11/2020 1249   CL 102 01/11/2020 1249   CO2 25 01/11/2020 1249   GLUCOSE 97 01/11/2020 1249   GLUCOSE 126 (H) 08/28/2019 0849   BUN 14 01/11/2020 1249   CREATININE 1.00 01/11/2020 1249   CALCIUM 9.3 01/11/2020 1249   PROT 6.8 08/28/2019 0849   ALBUMIN 4.1 08/28/2019 0849   AST 21 08/28/2019 0849   ALT 31 08/28/2019 0849   ALKPHOS 76 08/28/2019 0849   BILITOT 0.7 08/28/2019 0849   GFRNONAA 79 01/11/2020 1249   GFRAA  91 01/11/2020 1249    No results found for: SPEP, UPEP  Lab Results  Component Value Date   WBC 5.0 08/28/2019   NEUTROABS 2.8 08/28/2019   HGB 14.7 08/28/2019   HCT 43.2 08/28/2019   MCV 91.5 08/28/2019   PLT 195.0 08/28/2019      Chemistry      Component Value Date/Time   NA 139 01/11/2020 1249   K 4.2 01/11/2020 1249   CL 102 01/11/2020 1249   CO2 25 01/11/2020 1249   BUN 14 01/11/2020 1249   CREATININE 1.00 01/11/2020 1249      Component Value Date/Time   CALCIUM 9.3 01/11/2020 1249   ALKPHOS 76 08/28/2019 0849   AST 21 08/28/2019 0849   ALT 31 08/28/2019 0849   BILITOT 0.7 08/28/2019 0849       RADIOGRAPHIC STUDIES: I have reviewed imaging study with the patient I have personally reviewed the radiological images as listed and agreed with the findings in the report. NM PET Image Initial (PI) Skull Base To Thigh  Result Date: 06/24/2020 CLINICAL DATA:  Initial treatment strategy for hematologic malignancy, staging of B-cell lymphoma in a 65 year old male. Marginal zone lymphoma found on colonoscopy. EXAM: NUCLEAR MEDICINE PET SKULL BASE TO THIGH TECHNIQUE: 11.0 mCi F-18 FDG was injected intravenously. Full-ring PET imaging was performed from the skull base to thigh after the radiotracer. CT data was obtained and used for attenuation correction and anatomic localization. Fasting blood glucose: 136 mg/dl COMPARISON:  Only prior cardiac imaging is available for review. FINDINGS: Mediastinal blood pool activity: SUV max 2.99 Liver activity: SUV max 4.32 NECK: No hypermetabolic lymph nodes in the neck. Incidental CT findings: None aside from sinus disease of the bilateral maxillary sinuses LEFT worse than RIGHT. CHEST: No hypermetabolic mediastinal or hilar nodes. No suspicious pulmonary nodules on the CT scan. Incidental CT findings: Scattered calcified atheromatous plaque of the thoracic aorta. Coronary artery disease of LEFT coronary artery circulation. Normal heart size  without pericardial effusion. Normal caliber of central pulmonary vessels and aorta. Limited assessment of cardiovascular structures given lack of intravenous contrast. No adenopathy in the chest. Lungs are clear aside from minimal basilar atelectasis. Airways are patent. ABDOMEN/PELVIS: No abnormal hypermetabolic activity within the liver, pancreas, adrenal glands, or spleen. No hypermetabolic lymph nodes in the abdomen or pelvis. Marked increased metabolic activity in the anterior LEFT prostate gland (image 199/4) area measuring approximately 1.2 cm in terms of FDG uptake without visible lesion on the CT portion of the evaluation. Maximum SUV approximately 9.6 no corresponding area of uptake in the transverse colon to correspond with an area of lymphoma discovered on recent colonic biopsy. Incidental CT findings: Low-density lesions in the liver, small low-density foci largest approximately 9 mm in the inferior LEFT hepatic lobe lateral segment likely small cysts. No corresponding increased metabolic activity in these areas. No pericholecystic stranding. Unremarkable appearance of pancreas, spleen, adrenal glands and kidneys. Urinary bladder decompressed. No acute gastrointestinal  process with normal appendix. Colonic diverticulosis. Postoperative changes near the low rectum. SKELETON: No focal hypermetabolic activity to suggest skeletal metastasis. Incidental CT findings: none IMPRESSION: No signs of nodal disease in the neck, chest, abdomen or pelvis. Focal area of uptake in the LEFT hemi prostate, nonspecific but raising the question of prostate neoplasm. Urologic consultation is suggested for further evaluation. Aortic atherosclerosis and coronary artery disease. Maxillary sinus disease. Aortic Atherosclerosis (ICD10-I70.0). These results will be called to the ordering clinician or representative by the Radiologist Assistant, and communication documented in the PACS or [employer]. Electronically Signed    By: Zetta Bills M.D.   On: 06/24/2020 12:24

## 2020-06-24 NOTE — Assessment & Plan Note (Signed)
He has history of local skin cancer I am concerned about risk of cancer recurrence especially with recent diagnosis of lymphoma We discussed the importance of aggressive skin protection and avoidance of excessive sun exposure if possible

## 2020-06-25 ENCOUNTER — Ambulatory Visit: Payer: Medicare HMO | Admitting: Hematology and Oncology

## 2020-06-25 ENCOUNTER — Telehealth: Payer: Self-pay

## 2020-06-25 ENCOUNTER — Ambulatory Visit: Admit: 2020-06-25 | Disposition: A | Discharge status: Home / Self Care | Payer: Medicare HMO

## 2020-06-25 MED ORDER — AMOXICILLIN 875 MG PO TABS: 875.0000 mg | ORAL_TABLET | Freq: Two times a day (BID) | ORAL | 0 refills | Status: AC

## 2020-06-25 NOTE — Telephone Encounter (Signed)
That is reasonable.  I tried to triage myself, and with a sore throat and sent pics, I felt ok to cover with amox.

## 2020-06-25 NOTE — Telephone Encounter (Signed)
We have no open appointments today.  Please advise.

## 2020-06-25 NOTE — Telephone Encounter (Signed)
Per chart review tab pt is at [facility] in [place].

## 2020-06-25 NOTE — Telephone Encounter (Signed)
[place] Night - Client Nonclinical Telephone Record AccessNurse Client [facility] Night - Client Client Site [place] Physician Owens Loffler - MD Contact Type Call Who Is Calling Patient / Member / Family / Caregiver Caller Name Adilson Grafton Caller Phone Number [phone] Patient Name Cuyler Vandyken Patient DOB [birth year] Call Type Message Only Information Provided Reason for Call Request to Schedule Office Appointment Initial Comment Caller states she needs to make an appointment for her husband to be seen today. Patient request to speak to RN No Additional Comment Declined nurse triage. Provided office hours. Disp. Time Disposition Final User 06/25/2020 7:11:04 AM General Information Provided Yes Haynes Dage Call Closed By: Haynes Dage Transaction Date/Time: 06/25/2020 7:07:36 AM (ET)

## 2020-07-01 ENCOUNTER — Encounter: Payer: Self-pay | Admitting: Hematology and Oncology

## 2020-08-20 ENCOUNTER — Encounter: Payer: Self-pay | Admitting: Hematology and Oncology

## 2020-08-20 DIAGNOSIS — R972 Elevated prostate specific antigen [PSA]: Secondary | ICD-10-CM | POA: Diagnosis not present

## 2020-08-22 ENCOUNTER — Telehealth: Payer: Self-pay

## 2020-08-22 NOTE — Telephone Encounter (Signed)
Called [facility] at [phone] regarding referral, appt 9/9 at 3 pm at [place], [place].  Faxed referral to [phone], received confirmation.  Called wife back and given below message. She verbalized understanding.

## 2020-09-04 ENCOUNTER — Encounter: Payer: Self-pay | Admitting: Urology

## 2020-09-04 ENCOUNTER — Ambulatory Visit: Payer: Medicare HMO | Admitting: Urology

## 2020-09-04 ENCOUNTER — Other Ambulatory Visit: Payer: Self-pay

## 2020-09-04 VITALS — BP 150/86 | HR 54 | Ht 70.0 in | Wt 223.8 lb

## 2020-09-04 DIAGNOSIS — N429 Disorder of prostate, unspecified: Secondary | ICD-10-CM | POA: Diagnosis not present

## 2020-09-04 DIAGNOSIS — R972 Elevated prostate specific antigen [PSA]: Secondary | ICD-10-CM | POA: Diagnosis not present

## 2020-09-04 MED ORDER — DIAZEPAM 5 MG PO TABS: 5.0000 mg | ORAL_TABLET | Freq: Once | ORAL | 0 refills | Status: DC | PRN

## 2020-09-04 NOTE — Patient Instructions (Signed)
Transrectal Prostate Biopsy Patient Education and Post Procedure Instructions    -Definition A prostate biopsy is the removal of a small amount of tissue from the prostate gland. The tissue is examined to determine whether there is cancer.  -Reasons for Procedure A prostate biopsy is usually done after an abnormal finding by: Digital rectal exam Prostate specific antigen (PSA) blood test A prostate biopsy is the only way to find out if cancer cells are present.  -Possible Complications Problems from the procedure are rare, but all procedures have some risk including: Infection Bruising or lengthy bleeding from the rectum, or in urine or semen Difficulty urinating Reactions to anesthesia Factors that may increase the risk of complications include: Smoking History of bleeding disorders or easy bruising Use of any medications, over-the-counter medications, or herbal supplements Sensitivity or allergy to latex, medications, or anesthesia.  -Prior to Procedure Talk to your doctor about your medications. Blood thinning medications including aspirin should be stopped 1 week prior to procedure. If prescribed by your cardiologist we may need approval before stopping medications. Use a Fleets enema 2 hours before the procedure. Can be purchased at your pharmacy. Antibiotics will be administered in the clinic prior to procedure.  Please make sure you eat a light meal prior to coming in for your appointment. This can help prevent lightheadedness during the procedure and upset stomach from antibiotics. Please bring someone with you to the procedure to drive you home.  -Anesthesia Transrectal biopsy: Local anesthesia--Just the area that is being operated on is numbed using an injectable anesthetic.  -Description of the Procedure Transrectal biopsy--Your doctor will insert a small ultrasound device into the rectum. This device will produce sound waves to create an image of the prostate.  These images will help guide placement of the needle. Your doctor will then insert the needle through the wall of the rectum and into the prostate gland. The procedure should take approximately 15-30 minutes.  -Will It Hurt? You may have discomfort and soreness at the biopsy site. Pain and discomfort after the procedure can be managed with medications.  -Postoperative Care When you return home after the procedure, do the following to help ensure a smooth recovery: Stay hydrated. Drink plenty of fluids for the next few days. Avoid difficult physical activity the day and evening of the procedure. Keep in mind that you may see blood in your urine, stool, or semen for several days. Resume any medications that were stopped when you are advised to do so.  After the sample is taken, it will be sent to a pathologist for examination under a microscope. This doctor will analyze the sample for cancer. You will be scheduled for an appointment to discuss results. If cancer is present, your doctor will work with you to develop a treatment plan.   -Call Your Doctor or Seek Immediate Medical Attention It is important to monitor your recovery. Alert your doctor to any problems. If any of the following occur, call your doctor or go to the emergency room: Fever 100.5 or greater within 1 week post procedure go directly to ER Call the office for: Blood in the urine more than 1 week or in semen for more than 6 weeks post-biopsy Pain that you cannot control with the medications you have been given Pain, burning, urgency, or frequency of urination Cough, shortness of breath, or chest pain- if severe go to ER Heavy rectal bleeding or bleeding that lasts more than 1 week after the biopsy If you  have any questions or concerns please contact our office at [facility]  [facility] [address] [place], [place] [address] [phone]      Prostate Cancer  Screening Prostate cancer screening is a test that is done to check for the presence of prostate cancer in men. The prostate gland is a walnut-sized gland that is located below the bladder and in front of the rectum in males. The function of the prostate is to add fluid to semen during ejaculation. Prostate cancer is the second most common type of cancer in men. Who should have prostate cancer screening? Screening recommendations vary based on age and other risk factors. Screening is recommended if: You are older than age 4. If you are age 50-69, talk with your health care provider about your need for screening and how often screening should be done. Because most prostate cancers are slow growing and will not cause death, screening is generally reserved in this age group for men who have a 10-15-year life expectancy. You are younger than age 18, and you have these risk factors: Being a [place] male or a male of African descent. Having a father, brother, or uncle who has been diagnosed with prostate cancer. The risk is higher if your family member's cancer occurred at an early age. Screening is not recommended if: You are younger than age 49. You are between the ages of 35 and 29 and you have no risk factors. You are 77 years of age or older. At this age, the risks that screening can cause are greater than the benefits that it may provide. If you are at high risk for prostate cancer, your health care provider may recommend that you have screenings more often or that you start screening at a younger age. How is screening for prostate cancer done? The recommended prostate cancer screening test is a blood test called the prostate-specific antigen (PSA) test. PSA is a protein that is made in the prostate. As you age, your prostate naturally produces more PSA. Abnormally high PSA levels may be caused by: Prostate cancer. An enlarged prostate that is not caused by cancer (benign prostatic hyperplasia, BPH).  This condition is very common in older men. A prostate gland infection (prostatitis). Depending on the PSA results, you may need more tests, such as: A physical exam to check the size of your prostate gland. Blood and imaging tests. A procedure to remove tissue samples from your prostate gland for testing (biopsy). What are the benefits of prostate cancer screening? Screening can help to identify cancer at an early stage, before symptoms start and when the cancer can be treated more easily. There is a small chance that screening may lower your risk of dying from prostate cancer. The chance is small because prostate cancer is a slow-growing cancer, and most men with prostate cancer die from a different cause. What are the risks of prostate cancer screening? The main risk of prostate cancer screening is diagnosing and treating prostate cancer that would never have caused any symptoms or problems. This is called overdiagnosisand overtreatment. PSA screening cannot tell you if your PSA is high due to cancer or a different cause. A prostate biopsy is the only procedure to diagnose prostate cancer. Even the results of a biopsy may not tell you if your cancer needs to be treated. Slow-growing prostate cancer may not need any treatment other than monitoring, so diagnosing and treating it may cause unnecessary stress or other side effects. Questions to  ask your health care provider When should I start prostate cancer screening? What is my risk for prostate cancer? How often do I need screening? What type of screening tests do I need? How do I get my test results? What do my results mean? Do I need treatment? Where to find more information The American Cancer Society: www.cancer.org American Urological Association: www.auanet.org Contact a health care provider if: You have difficulty urinating. You have pain when you urinate or ejaculate. You have blood in your urine or semen. You have pain in your  back or in the area of your prostate. Summary Prostate cancer is a common type of cancer in men. The prostate gland is located below the bladder and in front of the rectum. This gland adds fluid to semen during ejaculation. Prostate cancer screening may identify cancer at an early stage, when the cancer can be treated more easily. The prostate-specific antigen (PSA) test is the recommended screening test for prostate cancer. Discuss the risks and benefits of prostate cancer screening with your health care provider. If you are age 18 or older, the risks that screening can cause are greater than the benefits that it may provide. This information is not intended to replace advice given to you by your health care provider. Make sure you discuss any questions you have with your health care provider. Document Revised: 02/23/2020 Document Reviewed: 08/04/2018 Elsevier Patient Education  [place].

## 2020-09-04 NOTE — Progress Notes (Signed)
09/04/20 10:24 AM   Charles [name] [birth year] [id number]  CC: Prostate lesion on PET/CT, ED  HPI: I saw Charles [name] and his wife today who is a Marine scientist and contributes to much of the history regarding his abnormal prostate lesion on PET/CT.  Briefly, healthy 65 year old male who was found to have a polyp on colonoscopy that ultimately returned B-cell lymphoma and underwent a PET/CT for staging imaging.  This showed no abnormalities aside from a focal area of intake in the left hemiprostate.  PSA has been normal in the past, most recently 1.65 in August 2021 which was stable from 1.62 years prior 2019.  He denies any urinary symptoms at baseline.  He has ED and was recently prescribed sildenafil by different urologist.  He was evaluated by Dr. Tresa Moore just 2 weeks ago at [facility] urology who recommended prostate biopsy, but apparently this was not able to be scheduled until the end of October which prompted the patient and his wife to seek a second opinion.  He denies any family history of prostate cancer, but has a family history of breast cancer in his mother.     PMH: Past Medical History:  Diagnosis Date   Anxiety    Controlled type 2 diabetes mellitus without complication, without long-term current use of insulin ([place])    Diverticulitis    Generalized anxiety disorder 10/21/2014   GERD (gastroesophageal reflux disease)     Surgical History: Past Surgical History:  Procedure Laterality Date   ACHILLES TENDON REPAIR  2001   BICEPS TENDON REPAIR Left 2014   COLONOSCOPY  2012   polyps-[place]   HEMORROIDECTOMY  2012   ROTATOR CUFF REPAIR Left 2014   TOTAL HIP ARTHROPLASTY Right 12/07/2014   Procedure: RIGHT TOTAL HIP ARTHROPLASTY ANTERIOR APPROACH;  Surgeon: Mcarthur Rossetti, MD;  Location: [facility];  Service: Orthopedics;  Laterality: Right;      Family History: Family History  Problem Relation Age of Onset   Alcohol abuse Father    Heart attack Father    Hyperlipidemia  Father    Stroke Father    Hypertension Father    Breast cancer Mother 43   Hypertension Mother    Colon cancer Neg Hx    Colon polyps Neg Hx    Esophageal cancer Neg Hx    Rectal cancer Neg Hx    Stomach cancer Neg Hx     Social History:  reports that he quit smoking about 20 years ago. His smoking use included cigarettes. He has a 10.00 pack-year smoking history. He quit smokeless tobacco use about 16 years ago.  His smokeless tobacco use included chew. He reports that he does not drink alcohol and does not use drugs.  Physical Exam: BP (!) 150/86 (BP Location: Left Arm, Patient Position: Sitting, Cuff Size: Large)   Pulse (!) 54   Ht '5\' 10"'$  (1.778 m)   Wt 223 lb 12.8 oz (101.5 kg)   BMI 32.11 kg/m    Constitutional:  Alert and oriented, No acute distress. Cardiovascular: No clubbing, cyanosis, or edema. Respiratory: Normal respiratory effort, no increased work of breathing. GI: Abdomen is soft, nontender, nondistended, no abdominal masses DRE: 40 g, smooth, benign without nodules or masses  Laboratory Data: Reviewed, see HPI  Pertinent Imaging: I have personally viewed and interpreted the PET CT and agree with findings, see HPI.  Assessment & Plan:   65 year old male with incidentally found enhancing lesion within the prostate on PET/CT performed for work-up of lymphoma with  no evidence of recurrent disease, PSA has been normal, and last value was 1.65 in August 2021.  He was seen by Dr. Tresa Moore at [facility]rology in [place] just 2 weeks ago who recommended prostate biopsy, but this was not able to be performed until October.  I agree with Dr. Tresa Moore that he requires prostate biopsy.  If we have a biopsy opening sooner, we are happy to perform that here.  Our goal is to detect clinically significant prostate cancers, and manage with either active surveillance, surgery, or radiation for localized disease. Risks of prostate biopsy include bleeding, infection (including life  threatening sepsis), pain, and lower urinary symptoms. Hematuria, hematospermia, and blood in the stool are all common after biopsy and can persist up to 4 weeks.   -Agree with prostate biopsy for abnormal PET/CT findings, will schedule with [place] if able to accommodate sooner than in [place] with trial of sildenafil -PSA repeated today, will call with results   Charles Madrid, MD 09/04/2020  [place] [address], [place], [place] [address] [phone]

## 2020-09-05 LAB — PSA TOTAL (REFLEX TO FREE): Prostate Specific Ag, Serum: 1.8 ng/mL (ref 0.0–4.0)

## 2020-09-08 ENCOUNTER — Other Ambulatory Visit: Payer: Self-pay | Admitting: Family Medicine

## 2020-09-08 NOTE — Telephone Encounter (Signed)
Please schedule Medicare Wellness Visit with Dr. Lorelei Pont with fasting labs prior.

## 2020-09-10 NOTE — Telephone Encounter (Signed)
Called pt to schedule appt. Pt did not answer so I left a VM.

## 2020-10-02 ENCOUNTER — Encounter: Payer: Self-pay | Admitting: Urology

## 2020-10-02 ENCOUNTER — Other Ambulatory Visit: Payer: Self-pay

## 2020-10-02 ENCOUNTER — Ambulatory Visit: Payer: Medicare HMO | Admitting: Urology

## 2020-10-02 VITALS — BP 118/71 | HR 60 | Ht 70.0 in | Wt 223.0 lb

## 2020-10-02 DIAGNOSIS — C61 Malignant neoplasm of prostate: Secondary | ICD-10-CM | POA: Diagnosis not present

## 2020-10-02 DIAGNOSIS — C884 Extranodal marginal zone B-cell lymphoma of mucosa-associated lymphoid tissue [MALT-lymphoma]: Secondary | ICD-10-CM | POA: Diagnosis not present

## 2020-10-02 DIAGNOSIS — R972 Elevated prostate specific antigen [PSA]: Secondary | ICD-10-CM | POA: Diagnosis not present

## 2020-10-02 MED ORDER — LEVOFLOXACIN 500 MG PO TABS
500.0000 mg | ORAL_TABLET | Freq: Once | ORAL | Status: AC
  Administered 2020-10-02: 500 mg via ORAL

## 2020-10-02 MED ORDER — GENTAMICIN SULFATE 40 MG/ML IJ SOLN
80.0000 mg | Freq: Once | INTRAMUSCULAR | Status: AC
  Administered 2020-10-02: 80 mg via INTRAMUSCULAR

## 2020-10-02 NOTE — Patient Instructions (Signed)
Transrectal Ultrasound-Guided Prostate Biopsy, Care After This sheet gives you information about how to care for yourself after your procedure. Your doctor may also give you more specific instructions. If youhave problems or questions, contact your doctor. What can I expect after the procedure? After the procedure, it is common to have: Pain and discomfort in your butt, especially while sitting. Pink-colored pee (urine), due to small amounts of blood in the pee. Burning while peeing (urinating). Blood in your poop (stool). Bleeding from your butt. Blood in your semen. Follow these instructions at home: Medicines Take over-the-counter and prescription medicines only as told by your doctor. If you were prescribed antibiotic medicine, take it as told by your doctor. Do not stop taking the antibiotic even if you start to feel better. Activity  Do not drive for 24 hours if you were given a medicine to help you relax (sedative) during your procedure. Return to your normal activities as told by your doctor. Ask your doctor what activities are safe for you. Ask your doctor when it is okay for you to have sex. Do not lift anything that is heavier than 10 lb (4.5 kg), or the limit that you are told, until your doctor says that it is safe.  General instructions  Drink enough water to keep your pee pale yellow. Watch your pee, poop, and semen for new bleeding or bleeding that gets worse. Keep all follow-up visits as told by your doctor. This is important.  Contact a doctor if you: Have blood clots in your pee or poop. Notice that your pee smells bad or unusual. Have very bad belly pain. Have trouble peeing. Notice that your lower belly feels firm. Have blood in your pee for more than 2 weeks after the procedure. Have blood in your semen for more than 2 months after the procedure. Have problems getting an erection. Feel sick to your stomach (nauseous) or throw up (vomit). Have new or worse  bleeding in your pee, poop, or semen. Get help right away if you: Have a fever or chills. Have bright red pee. Have very bad pain that does not get better with medicine. Cannot pee. Summary After this procedure, it is common to have pain and discomfort around your butt, especially while sitting. You may have blood in your pee and poop. It is common to have blood in your semen for 1-2 months. If you were prescribed antibiotic medicine, take it as told by your doctor. Do not stop taking the antibiotic even if you start to feel better. Get help right away if you have a fever or chills. This information is not intended to replace advice given to you by your health care provider. Make sure you discuss any questions you have with your healthcare provider. Document Revised: 11/06/2019 Document Reviewed: 09/07/2019 Elsevier Patient Education  2022 Elsevier Inc.  

## 2020-10-02 NOTE — Addendum Note (Signed)
Addended by: Donalee Citrin on: 10/02/2020 03:35 PM   Modules accepted: Orders

## 2020-10-02 NOTE — Progress Notes (Signed)
   10/02/20  Indication: Abnormal prostate lesion on PET/CT  65 year old male recently diagnosed with B-cell lymphoma on colonoscopy and underwent a PET/CT for staging imaging.  This showed no abnormalities aside from a focal area of intake in the left hemiprostate.  PSA is currently normal at 1.8.  He opted for biopsy to rule out malignancy.  Prostate Biopsy Procedure   Informed consent was obtained, and we discussed the risks of bleeding and infection/sepsis. A time out was performed to ensure correct patient identity.  Pre-Procedure: - Last PSA Level: 1.8 - Gentamicin and levaquin given for antibiotic prophylaxis - Transrectal Ultrasound performed revealing a 46 gm prostate, PSA density 0.04 - No significant hypoechoic or median lobe noted  Procedure: - Prostate block performed using 10 cc 1% lidocaine and biopsies taken from sextant areas, a total of 12 under ultrasound guidance.  Post-Procedure: - Patient tolerated the procedure well - He was counseled to seek immediate medical attention if experiences significant bleeding, fevers, or severe pain - Return in one week to discuss biopsy results  Assessment/ Plan: Will follow up in 1-2 weeks to discuss pathology  Nickolas Madrid, MD 10/02/2020

## 2020-10-04 LAB — SURGICAL PATHOLOGY

## 2020-10-09 ENCOUNTER — Ambulatory Visit: Payer: Medicare HMO | Admitting: Urology

## 2020-10-09 ENCOUNTER — Other Ambulatory Visit: Payer: Self-pay

## 2020-10-09 ENCOUNTER — Encounter: Payer: Self-pay | Admitting: Urology

## 2020-10-09 VITALS — BP 129/87 | HR 62 | Ht 70.0 in | Wt 222.0 lb

## 2020-10-09 DIAGNOSIS — C61 Malignant neoplasm of prostate: Secondary | ICD-10-CM | POA: Diagnosis not present

## 2020-10-09 NOTE — Patient Instructions (Signed)
Prostate Cancer The prostate is a small gland that produces fluid that makes up semen (seminal fluid). It is located below the bladder in men, in front of the rectum. Prostate cancer is the abnormal growth of cells in the prostate gland. What are the causes? The exact cause of this condition is not known. What increases the risk? You are more likely to develop this condition if: You are 66 years of age or older. You have a family history of prostate cancer. You have a family history of breast and ovarian cancer. You have genes that are passed from parent to child (inherited), such as BRCA1 and BRCA2. You have Lynch syndrome. African American men and men of African descent are diagnosed with prostate cancer at higher rates than other men. The reasons for this are not well understood and are likely due to a combination of genetic and environmental factors. What are the signs or symptoms? Symptoms of this condition include: Problems with urination. This may include: A weak or interrupted flow of urine. Trouble starting or stopping urination. Trouble emptying the bladder all the way. The need to urinate more often, especially at night. Blood in urine or semen. Persistent pain or discomfort in the lower back, lower abdomen, or hips. Trouble getting an erection. Weakness or numbness in the legs or feet. How is this diagnosed? This condition can be diagnosed with: A digital rectal exam. For this exam, a health care provider inserts a gloved finger into the rectum to feel the prostate gland. A blood test called a prostate-specific antigen (PSA) test. A procedure in which a sample of tissue is taken from the prostate and checked under a microscope (prostate biopsy). An imaging test called transrectal ultrasonography. Once the condition is diagnosed, tests will be done to determine how far the cancer has spread. This is called staging the cancer. Staging may involve imaging tests, such as a bone  scan, CT scan, PET scan, or MRI. Stages of prostate cancer The stages of prostate cancer are as follows: Stage 1 (I). At this stage, the cancer is found in the prostate only. The cancer is not visible on imaging tests, and it is usually found by accident, such as during prostate surgery. Stage 2 (II). At this stage, the cancer is more advanced than it is in stage 1, but the cancer has not spread outside the prostate. Stage 3 (III). At this stage, the cancer has spread beyond the outer layer of the prostate to nearby tissues. The cancer may be found in the seminal vesicles, which are near the bladder and the prostate. Stage 4 (IV). At this stage, the cancer has spread to other parts of the body, such as the lymph nodes, bones, bladder, rectum, liver, or lungs. Prostate cancer grading Prostate cancer is also graded according to how the cancer cells look under a microscope. This is called the Gleason score and the total score can range from 6-10, indicating how likely it is that the cancer will spread (metastasize) to other parts of the body. The higher the score, the greater the likelihood that the cancer will spread. Gleason 6 or lower: This indicates that the cancer cells look similar to normal prostate cells (well differentiated). Gleason 7: This indicates that the cancer cells look somewhat similar to normal prostate cells (moderately differentiated). Gleason 8, 9, or 10: This indicates that the cancer cells look very different than normal prostate cells (poorly differentiated). How is this treated? Treatment for this condition depends on several factors,  including the stage of the cancer, your age, personal preferences, and your overall health. Talk with your health care provider about treatment options that are recommended for you. Common treatments include: Observation for early stage prostate cancer (active surveillance). This involves having exams, blood tests, and in some cases, more biopsies.  For some men, this is the only treatment needed. Surgery. Types of surgeries include: Open surgery (radical prostatectomy). In this surgery, a larger incision is made to remove the prostate. A laparoscopic radical prostatectomy. This is a surgery to remove the prostate and lymph nodes through several small incisions. It is often referred to as a minimally invasive surgery. A robotic radical prostatectomy. This is laparoscopic surgery to remove the prostate and lymph nodes with the help of robotic arms that are controlled by the surgeon. Cryoablation. This is surgery to freeze and destroy cancer cells. Radiation treatment. Types of radiation treatment include: External beam radiation. This type aims beams of radiation from outside the body at the prostate to destroy cancerous cells. Brachytherapy. This type uses radioactive needles, seeds, wires, or tubes that are implanted into the prostate gland. Like external beam radiation, brachytherapy destroys cancerous cells. An advantage is that this type of radiation limits the damage to surrounding tissue and has fewer side effects. Chemotherapy. This treatment kills cancer cells or stops them from multiplying. It kills both cancer cells and normal cells. Targeted therapy. This treatment uses medicines to kill cancer cells without damaging normal cells. Hormone treatment. This treatment involves taking medicines that act on testosterone, one of the male hormones, by: Stopping your body from producing testosterone. Blocking testosterone from reaching cancer cells. Follow these instructions at home: Lifestyle Do not use any products that contain nicotine or tobacco. These products include cigarettes, chewing tobacco, and vaping devices, such as e-cigarettes. If you need help quitting, ask your health care provider. Eat a healthy diet. To do this: Eat foods that are high in fiber. These include beans, whole grains, and fresh fruits and vegetables. Limit  foods that are high in fat and sugar. These include fried or sweet foods. Treatment for prostate cancer may affect sexual function. If you have a partner, continue to have intimate moments. This may include touching, holding, hugging, and caressing your partner. Get plenty of sleep. Consider joining a support group for men who have prostate cancer. Meeting with a support group may help you learn to manage the stress of having cancer. General instructions Take over-the-counter and prescription medicines only as told by your health care provider. If you have to go to the hospital, notify your cancer specialist (oncologist). Keep all follow-up visits. This is important. Where to find more information American Cancer Society: www.cancer.org American Society of Clinical Oncology: www.cancer.net National Cancer Institute: www.cancer.gov Contact a health care provider if: You have new or increasing trouble urinating. You have new or increasing blood in your urine. You have new or increasing pain in your hips, back, or chest. Get help right away if: You have weakness or numbness in your legs. You cannot control urination or your bowel movements (incontinence). You have chills or a fever. Summary The prostate is a small gland that is involved in the production of semen. It is located below a man's bladder, in front of the rectum. Prostate cancer is the abnormal growth of cells in the prostate gland. Treatment for this condition depends on the stage of the cancer, your age, personal preferences, and your overall health. Talk with your health care provider about treatment   options that are recommended for you. Consider joining a support group for men who have prostate cancer. Meeting with a support group may help you learn to manage the stress of having cancer. This information is not intended to replace advice given to you by your health care provider. Make sure you discuss any questions you have with  your health care provider. Document Revised: 03/20/2020 Document Reviewed: 03/20/2020 Elsevier Patient Education  2022 [employer]  

## 2020-10-09 NOTE — Progress Notes (Signed)
10/09/2020 11:47 AM   Charles [name] Charles [name] [birth year] [id number]  Reason for visit: Discuss prostate biopsy results  HPI: 65 year old male recently diagnosed with B-cell lymphoma on colonoscopy and underwent a PET/CT for staging imaging.  This showed no abnormalities aside from a focal area of intake in the left hemiprostate.  PSA is currently normal at 1.8.    Biopsy on 10/02/2020 showed a 46 g prostate with a PSA density of 0.04, no abnormalities seen on transrectal ultrasound.  Biopsies showed only 1/12 cores positive for Gleason score 3+3= 6 prostate adenocarcinoma with maximal involvement of 10%, that correlated with the location of the abnormality on PET/CT in the left side.  We had a lengthy conversation today about the patient's new diagnosis of prostate cancer.  We reviewed the risk classifications per the AUA guidelines including very low risk, low risk, intermediate risk, and high risk disease, and the need for additional staging imaging with CT and bone scan in patients with unfavorable intermediate risk and high risk disease.  I explained that his life expectancy, clinical stage, Gleason score, PSA, and other co-morbidities influence treatment strategies.  We discussed the roles of active surveillance, radiation therapy, surgical therapy with robotic prostatectomy, and hormone therapy with androgen deprivation.  We discussed that patients urinary symptoms also impact treatment strategy, as patients with severe lower urinary tract symptoms may have significant worsening or even develop urinary retention after undergoing radiation.  In regards to surgery, we discussed robotic prostatectomy +/- lymphadenectomy at length.  The procedure takes 3 to 4 hours, and patient's typically discharge home on post-op day #1.  A Foley catheter is left in place for 7 to 10 days to allow for healing of the vesicourethral anastomosis.  There is a small risk of bleeding, infection, damage to surrounding  structures or bowel, hernia, DVT/PE, or serious cardiac or pulmonary complications.  We discussed at length post-op side effects including erectile dysfunction, and the importance of pre-operative erectile function on long-term outcomes.  Even with a nerve sparing approach, there is an approximately 25% rate of permanent erectile dysfunction.  We also discussed postop urinary incontinence at length.  We expect patients to have stress incontinence post-operatively that will improve over period of weeks to months.  Less than 10% of men will require a pad at 1 year after surgery.  Patients will need to avoid heavy lifting and strenuous activity for 3 to 4 weeks, but most men return to their baseline activity status by 6 weeks.  In summary, Charles [name] is a 65 y.o. man with newly diagnosed low risk prostate cancer. He would like to pursue active surveillance.  We discussed the possibility of additional pathology from the PET/CT lesion that could potentially not correlate with the biopsy findings, and the need for close monitoring of the PSA moving forward.  He has an artificial hip and we will see if this is MRI compatible.  I think he would benefit from a prostate MRI in 6 to 9 months and repeat PSA, with consideration of fusion biopsy if any abnormalities seen on MRI.  If his hip prosthesis is not MRI compatible, we will tentatively plan for repeat transrectal ultrasound-guided biopsy in around 12 to 18 months pending PSA trend.    I spent 30 total minutes on the day of the encounter including pre-visit review of the medical record, face-to-face time with the patient, and post visit ordering of labs/imaging/tests.  Billey Co, MD  [facility] [address]  [address], [place], [place] [address] [phone]

## 2020-10-21 ENCOUNTER — Telehealth: Payer: Self-pay | Admitting: Orthopaedic Surgery

## 2020-10-21 NOTE — Telephone Encounter (Signed)
Pt calling asking for a call back from the nurse. Provider want so do mri on prostate but needs the okay to say it is safe to do after his previous hip replacement. The best call back number is [phone].

## 2020-10-21 NOTE — Telephone Encounter (Signed)
LMOM for patient letting him know he can have MRI

## 2020-10-22 ENCOUNTER — Other Ambulatory Visit: Payer: Self-pay

## 2020-12-07 ENCOUNTER — Telehealth: Payer: Self-pay | Admitting: Family Medicine

## 2020-12-08 NOTE — Telephone Encounter (Signed)
Please schedule Medicare Wellness with nurse and CPE with fasting labs prior with Dr. Copland. °

## 2020-12-09 ENCOUNTER — Encounter: Payer: Self-pay | Admitting: Family Medicine

## 2020-12-09 NOTE — Telephone Encounter (Signed)
Called and spoke with Mrs. Zahler and got him scheduled for 12/15 @1040 

## 2020-12-19 ENCOUNTER — Encounter: Payer: Medicare HMO | Admitting: Family Medicine

## 2021-01-15 ENCOUNTER — Other Ambulatory Visit: Payer: Self-pay | Admitting: Family Medicine

## 2021-01-15 NOTE — Telephone Encounter (Signed)
Last office visit 08/31/2019.  Last refilled lorazepam 05/16/2020 for #30 with 2 refills.  Atorvastatin 12/08/20 for #90 with no refills.  Last lipid 02/01/2020.  No future appointment with PCP.  Patient cancelled his CPE on 12/19/2020.  Ok to refill?

## 2021-02-25 ENCOUNTER — Telehealth: Payer: Self-pay | Admitting: Family Medicine

## 2021-02-25 NOTE — Telephone Encounter (Signed)
Called pt to schedule awv. Patient stated he does not need that because he lives with a nurse.

## 2021-03-06 ENCOUNTER — Telehealth (INDEPENDENT_AMBULATORY_CARE_PROVIDER_SITE_OTHER): Payer: Medicare HMO | Admitting: Nurse Practitioner

## 2021-03-06 ENCOUNTER — Encounter: Payer: Self-pay | Admitting: Family Medicine

## 2021-03-06 ENCOUNTER — Encounter: Payer: Self-pay | Admitting: Nurse Practitioner

## 2021-03-06 ENCOUNTER — Other Ambulatory Visit: Payer: Self-pay

## 2021-03-06 VITALS — BP 124/80 | HR 77 | Temp 98.9°F | Wt 225.0 lb

## 2021-03-06 DIAGNOSIS — U071 COVID-19: Secondary | ICD-10-CM | POA: Insufficient documentation

## 2021-03-06 MED ORDER — MOLNUPIRAVIR EUA 200MG CAPSULE: 4.0000 | ORAL_CAPSULE | Freq: Two times a day (BID) | ORAL | 0 refills | Status: AC

## 2021-03-06 NOTE — Assessment & Plan Note (Signed)
Patient is vaccinated.  Discussed antiviral treatment synovator EUA only.  After joint discussion patient elected to pursue antiviral treatment.  Did discuss common side effects with antiviral medication chosen.  Did discuss signs and symptoms when to seek urgent or emergent health care.  Also discussed CDC guidelines in regards to recommended self-isolation/quarantining timelines ?Start molnupiravir as soon as possible ?

## 2021-03-06 NOTE — Telephone Encounter (Signed)
Please triage

## 2021-03-06 NOTE — Telephone Encounter (Signed)
I spoke with pt's wife; tested + covid home test on 03/06/21; symptoms started on 03/04/21 with H/A with pain level 5 and head congested; runny nose on and off; no fever, pt does have body aches,S/T with pain upon swallowing;pt is taking ibuprofen,  dry cough but no SOB. No vomiting or diarrhea. No loss of taste or smell. Pt took 2 covid vaccines and one booster. Self quarantine, drink plenty of fluids, rest, and take Tylenol for fever. UC & ED precautions given and pt's wife voiced understanding.scheduled VV appt with Romilda Garret NP 03/06/21 at 3 PM. Pts wife will have vitals ready when CMA calls.Sending note to Romilda Garret NP and Anastasiya CMA and will teams Anastasiya. ? ?

## 2021-03-06 NOTE — Progress Notes (Signed)
Patient ID: Charles [name], male    DOB: July 05, 1955, 66 y.o.   MRN: [id number]  Virtual visit completed through [place], a video enabled telemedicine application. Due to national recommendations of social distancing due to COVID-19, a virtual visit is felt to be most appropriate for this patient at this time. Reviewed limitations, risks, security and privacy concerns of performing a virtual visit and the availability of in person appointments. I also reviewed that there may be a patient responsible charge related to this service. The patient agreed to proceed.   Patient location: home Provider location: [place] at [facility], office Persons participating in this virtual visit: patient, provider   If any vitals were documented, they were collected by patient at home unless specified below.    BP 124/80    Pulse 77    Temp 98.9 F (37.2 C) Comment: per patient today   Wt 225 lb (102.1 kg)    SpO2 96%    BMI 32.28 kg/m    CC: Covid 19 Subjective:   HPI: Charles [name] is a 66 y.o. male presenting on 03/06/2021 for Covid Positive (On 03/06/21, symptoms started on 03/04/21 with H/A, head congested; runny nose on and off; no fever, pt does have body aches,S/T, dry cough with slight SOB. No vomiting or diarrhea. No loss of taste or smell. Taking Ibuprofen, Sudafed and Mucinex)  Symptoms on 03/04/2021 Covid test today that was positive Vaccine x 2 and 2 booster No sick contacts  Ibuprofen, sudafed and mucinex   Relevant past medical, surgical, family and social history reviewed and updated as indicated. Interim medical history since our last visit reviewed. Allergies and medications reviewed and updated. Outpatient Medications Prior to Visit  Medication Sig Dispense Refill   aspirin EC 81 MG tablet Take 81 mg by mouth daily.     atorvastatin (LIPITOR) 40 MG tablet TAKE 1 TABLET BY MOUTH EVERY DAY 90 tablet 0   Cetirizine HCl 10 MG CAPS      CLINPRO 5000 1.1 % PSTE Place onto teeth  daily.     Flaxseed, Linseed, (FLAX SEED OIL PO) Take by mouth.     LORazepam (ATIVAN) 0.5 MG tablet TAKE 1 TABLET BY MOUTH EVERY 8 HOURS AS NEEDED FOR ANXIETY 30 tablet 0   Multiple Vitamins-Minerals (MULTIVITAMIN WITH MINERALS) tablet Take 1 tablet by mouth daily.     Pseudoephedrine HCl (SUDAFED PO) Take 1 tablet by mouth daily as needed (allergies).      sildenafil (VIAGRA) 100 MG tablet Take 0.5-1 tablets (50-100 mg total) by mouth daily as needed for erectile dysfunction. 5 tablet 11   No facility-administered medications prior to visit.     Per HPI unless specifically indicated in ROS section below Review of Systems  Constitutional:  Positive for chills, fatigue and fever. Negative for appetite change.  HENT:  Positive for congestion, postnasal drip, sinus pressure and sore throat. Negative for ear discharge and ear pain.   Respiratory:  Positive for cough and shortness of breath (DOE).   Cardiovascular:  Negative for chest pain.  Gastrointestinal:  Positive for nausea. Negative for abdominal pain, diarrhea and vomiting.  Musculoskeletal:  Positive for arthralgias and myalgias.  Neurological:  Positive for headaches.  Objective:  BP 124/80    Pulse 77    Temp 98.9 F (37.2 C) Comment: per patient today   Wt 225 lb (102.1 kg)    SpO2 96%    BMI 32.28 kg/m   Wt Readings from Last 3  Encounters:  03/06/21 225 lb (102.1 kg)  10/09/20 222 lb (100.7 kg)  10/02/20 223 lb (101.2 kg)       Physical exam: Gen: alert, NAD, not ill appearing Pulm: speaks in complete sentences without increased work of breathing Psych: normal mood, normal thought content      Results for orders placed or performed in visit on 10/02/20  Surgical pathology  Result Value Ref Range   SURGICAL PATHOLOGY      SURGICAL PATHOLOGY CASE: [id number] PATIENT: Charles [name] Surgical Pathology Report     Specimen Submitted: A. PROSTATE, LEFT BASE B. PROSTATE, LEFT MID C. PROSTATE, LEFT APEX D.  PROSTATE, RIGHT BASE E. PROSTATE, RIGHT MID F. PROSTATE, RIGHT APEX G. PROSTATE, LEFT LATERAL BASE H. PROSTATE, LEFT LATERAL MID I. PROSTATE, LEFT LATERAL APEX J. PROSTATE, RIGHT LATERAL BASE K. PROSTATE, RIGHT LATERAL MID L. 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 Abnormal staging PET/CT scan showed uptake in anterior left prostate. Normal PSA.     DIAGNOSIS: Diagnostic Map   Benign  Atypical  Malignant  HGPIN     Diagnostic Summary  [A] PROSTATE, LEFT BASE:   NEGATIVE FOR MALIGNANCY.  [B] PROSTATE, LEFT MID:   NEGATIVE FOR MALIGNANCY.  [C] PROSTATE, LEFT APEX:   ACINAR ADENOCARCINOMA, GLEASON 3+3=6  (GG 1), INVOLVING 1 CORES, MEASURING 2  MM ( 10%). 1 of 3 cores is positive.  [D]  PROSTATE, RIGHT BASE:   NEGATIVE FOR MALIGNANCY.  [E] PROSTATE, RIGHT MID:   NEGATIVE FOR MALIGNANCY.  [F] PROSTATE, RIGHT APEX:   SMALL FOCUS OF ATYPICAL GLANDS.  [G] PROSTATE, LEFT LATERAL BASE:   NEGATIVE FOR MALIGNANCY.  [H] PROSTATE, LEFT LATERAL MID:   NEGATIVE FOR MALIGNANCY.  [I] PROSTATE, LEFT LATERAL APEX:   NEGATIVE FOR MALIGNANCY.  [J] PROSTATE, RIGHT LATERAL BASE:   NEGATIVE FOR MALIGNANCY.  [K] PROSTATE, RIGHT LATERAL MID:   NEGATIVE FOR MALIGNANCY.  [L] PROSTATE, RIGHT LATERAL APEX:   NEGATIVE FOR MALIGNANCY.     Comment: In F, right apex, there is one atypical gland raising concern for malignancy.  There is no evidence of lymphoma among these samples.  Careful correlation with all imaging findings is advised in case the samples do not represent the area of concern.  GROSS DESCRIPTION: A. Labeled: Left base Received: Formalin Collection time: 3:35 PM on 10/02/2020 Placed into formalin time: 3:35 PM on 10/02/2020 Number of needle core biopsy(s):  1 Length: 1.0 cm Diameter: 0.1 cm Description: Needle core biopsy fragment of tan soft tissue Ink: Black Entirely submitted in 1  cassette.  B. Labeled: Left mid Received: Formalin Collection time: 3:35 PM on 10/02/2020 Placed into formalin time: 3:35 PM on 10/02/2020 Number of needle core biopsy(s): 1 Length: 1.8 cm Diameter: 0.1 cm Description: Needle core biopsy fragment of tan soft tissue Ink: Black Entirely submitted in 1 cassette.  C. Labeled: Left apex Received: Formalin Collection time: 3:35 PM on 10/02/2020 Placed into formalin time: 3:35 PM on 10/02/2020 Number of needle core biopsy(s): 3 Length: 0.2, 1.4, and 1.6 cm Diameter: 0.1 cm Description: Needle core biopsy fragment of tan soft tissue Ink: Black Entirely submitted in 1 cassette.  D. Labeled: Right base Received: Formalin Collection time: 3:35 PM on 10/02/2020 Placed into formalin time: 3:35 PM on 10/02/2020 Number of needle core biopsy(s): 1 Length: 1.2 cm Diameter: 0.1 cm Description: Nee dle core biopsy fragment of tan soft tissue Ink: Black Entirely submitted in 1 cassette.  E. Labeled: Right mid Received: Formalin Collection time: 3:35 PM on 10/02/2020 Placed into formalin time: 3:35 PM on 10/02/2020 Number of needle core biopsy(s): 1 Length: 1.3 cm Diameter: 0.1 cm Description: Needle core biopsy fragment of tan soft tissue Ink: Black Entirely submitted in 1 cassette.  F. Labeled: Right apex Received: Formalin Collection time: 3:35 PM on 10/02/2020 Placed into formalin time: 3:35 PM on 10/02/2020 Number of needle core biopsy(s): 2 Length: Ranges from 0.2-1.4 cm Diameter: 0.1 cm Description: Needle core biopsy fragment of tan soft tissue Ink: Black Entirely submitted in 1 cassette.  G. Labeled: Left lateral base Received: Formalin Collection time: 3:35 PM on 10/02/2020 Placed into formalin time: 3:35 PM on 10/02/2020 Number of needle core biopsy(s): 2 Length: Ranges from 0.2-0.5 cm Diameter: 0.1 cm Description: Needle core biopsy fragment o f tan soft tissue Ink: Black Entirely submitted in 1 cassette.  H. Labeled:  Left lateral mid Received: Formalin Collection time: 3:35 PM on 10/02/2020 Placed into formalin time: 3:35 PM on 10/02/2020 Number of needle core biopsy(s): 2 Length: Ranges from 0.5-1.0 cm Diameter: 0.1 cm Description: Needle core biopsy fragment of tan soft tissue Ink: Black Entirely submitted in 1 cassette.  I. Labeled: Left lateral apex Received: Formalin Collection time: 3:35 PM on 10/02/2020 Placed into formalin time: 3:35 PM on 10/02/2020 Number of needle core biopsy(s): 1 Length: 1.2 cm Diameter: 0.1 cm Description: Needle core biopsy fragment of tan soft tissue Ink: Black Entirely submitted in 1 cassette.  J. Labeled: Right lateral base Received: Formalin Collection time: 3:35 PM on 10/02/2020 Placed into formalin time: 3:35 PM on 10/02/2020 Number of needle core biopsy(s): 1 Length: 1.5 cm Diameter: 0.1 cm Description: Needle core biopsy fragment of tan soft tissue Ink: Bla ck Entirely submitted in 1 cassette.  K. Labeled: Right lateral mid Received: Formalin Collection time: 3:35 PM on 10/02/2020 Placed into formalin time: 3:35 PM on 10/02/2020 Number of needle core biopsy(s): 1 Length: 1.0 cm Diameter: 0.1 cm Description: Needle core biopsy fragment of tan soft tissue Ink: Black Entirely submitted in 1 cassette.  L. Labeled: Right lateral apex Received: Formalin Collection time: 3:35 PM on 10/02/2020 Placed into formalin time: 3:35 PM on 10/02/2020 Number of needle core biopsy(s): 1 Length: 1.9 cm Diameter: 0.1 cm Description: Needle core biopsy fragment of tan soft tissue Ink: Black Entirely submitted in 1 cassette.  [facility] 10/03/2020  Final Diagnosis performed by Bryan Lemma, MD.   Electronically signed 10/04/2020 2:56:08PM The electronic signature indicates that the named Attending Pathologist has evaluated the specimen Technical component performed at [facility], [address], [place], [place] [address] Lab: [phone] Dir: Worthy Rancher, MD, MMM   Professional component performed at [facility], [facility], [place], [place], [place] [address] Lab: [phone] Dir: Dellia Nims. Reuel Derby, MD    Assessment & Plan:   Problem List Items Addressed This Visit       Other   [id number] - Primary    Patient is vaccinated.  Discussed antiviral treatment synovator EUA only.  After joint discussion patient elected to pursue antiviral treatment.  Did discuss common side effects with antiviral medication chosen.  Did discuss signs and symptoms when to seek urgent or emergent health care.  Also discussed CDC guidelines in regards to recommended self-isolation/quarantining timelines Start molnupiravir as soon as possible      Relevant Medications   molnupiravir EUA (LAGEVRIO) 200 mg CAPS capsule     Meds ordered this encounter  Medications   molnupiravir EUA (  LAGEVRIO) 200 mg CAPS capsule    Sig: Take 4 capsules (800 mg total) by mouth 2 (two) times daily for 5 days.    Dispense:  40 capsule    Refill:  0    Order Specific Question:   Supervising Provider    Answer:   TOWER, MARNE A [[id number]]   No orders of the defined types were placed in this encounter.   I discussed the assessment and treatment plan with the patient. The patient was provided an opportunity to ask questions and all were answered. The patient agreed with the plan and demonstrated an understanding of the instructions. The patient was advised to call back or seek an in-person evaluation if the symptoms worsen or if the condition fails to improve as anticipated.  Follow up plan: Return if symptoms worsen or fail to improve.  Romilda Garret, NP

## 2021-03-06 NOTE — Telephone Encounter (Signed)
Noted. Will evaluate patient at virtual visit  ?

## 2021-03-15 ENCOUNTER — Other Ambulatory Visit: Payer: Self-pay | Admitting: Family Medicine

## 2021-03-31 ENCOUNTER — Other Ambulatory Visit: Payer: Self-pay

## 2021-03-31 ENCOUNTER — Ambulatory Visit (HOSPITAL_COMMUNITY)
Admission: RE | Admit: 2021-03-31 | Discharge: 2021-03-31 | Disposition: A | Discharge status: Home / Self Care | Payer: Medicare HMO | Source: Ambulatory Visit | Attending: Urology | Admitting: Urology

## 2021-03-31 DIAGNOSIS — K5730 Diverticulosis of large intestine without perforation or abscess without bleeding: Secondary | ICD-10-CM | POA: Diagnosis not present

## 2021-03-31 DIAGNOSIS — R590 Localized enlarged lymph nodes: Secondary | ICD-10-CM | POA: Diagnosis not present

## 2021-03-31 DIAGNOSIS — C61 Malignant neoplasm of prostate: Secondary | ICD-10-CM | POA: Insufficient documentation

## 2021-03-31 DIAGNOSIS — N402 Nodular prostate without lower urinary tract symptoms: Secondary | ICD-10-CM | POA: Diagnosis not present

## 2021-03-31 MED ORDER — GADOBUTROL 1 MMOL/ML IV SOLN
10.0000 mL | Freq: Once | INTRAVENOUS | Status: AC | PRN
  Administered 2021-03-31: 10 mL via INTRAVENOUS

## 2021-04-02 ENCOUNTER — Other Ambulatory Visit: Payer: Self-pay

## 2021-04-02 ENCOUNTER — Ambulatory Visit (INDEPENDENT_AMBULATORY_CARE_PROVIDER_SITE_OTHER): Payer: Medicare HMO | Admitting: Family Medicine

## 2021-04-02 VITALS — BP 120/80 | HR 71 | Temp 97.7°F | Ht 69.5 in | Wt 228.6 lb

## 2021-04-02 DIAGNOSIS — Z0000 Encounter for general adult medical examination without abnormal findings: Secondary | ICD-10-CM | POA: Diagnosis not present

## 2021-04-02 DIAGNOSIS — E782 Mixed hyperlipidemia: Secondary | ICD-10-CM

## 2021-04-02 DIAGNOSIS — C61 Malignant neoplasm of prostate: Secondary | ICD-10-CM

## 2021-04-02 DIAGNOSIS — Z79899 Other long term (current) drug therapy: Secondary | ICD-10-CM

## 2021-04-02 DIAGNOSIS — E119 Type 2 diabetes mellitus without complications: Secondary | ICD-10-CM

## 2021-04-02 HISTORY — DX: Malignant neoplasm of prostate: C61

## 2021-04-02 LAB — CBC WITH DIFFERENTIAL/PLATELET
Basophils Absolute: 0 10*3/uL (ref 0.0–0.1)
Basophils Relative: 0.8 % (ref 0.0–3.0)
Eosinophils Absolute: 0.3 10*3/uL (ref 0.0–0.7)
Eosinophils Relative: 6.1 % — ABNORMAL HIGH (ref 0.0–5.0)
HCT: 44.9 % (ref 39.0–52.0)
Hemoglobin: 15 g/dL (ref 13.0–17.0)
Lymphocytes Relative: 18.8 % (ref 12.0–46.0)
Lymphs Abs: 1 10*3/uL (ref 0.7–4.0)
MCHC: 33.3 g/dL (ref 30.0–36.0)
MCV: 92.7 fl (ref 78.0–100.0)
Monocytes Absolute: 0.5 10*3/uL (ref 0.1–1.0)
Monocytes Relative: 9.1 % (ref 3.0–12.0)
Neutro Abs: 3.5 10*3/uL (ref 1.4–7.7)
Neutrophils Relative %: 65.2 % (ref 43.0–77.0)
Platelets: 216 10*3/uL (ref 150.0–400.0)
RBC: 4.84 Mil/uL (ref 4.22–5.81)
RDW: 13.3 % (ref 11.5–15.5)
WBC: 5.4 10*3/uL (ref 4.0–10.5)

## 2021-04-02 LAB — BASIC METABOLIC PANEL
BUN: 13 mg/dL (ref 6–23)
CO2: 30 mEq/L (ref 19–32)
Calcium: 9.4 mg/dL (ref 8.4–10.5)
Chloride: 104 mEq/L (ref 96–112)
Creatinine, Ser: 0.84 mg/dL (ref 0.40–1.50)
GFR: 90.43 mL/min (ref >60.00)
Glucose, Bld: 157 mg/dL — ABNORMAL HIGH (ref 70–99)
Potassium: 4.7 mEq/L (ref 3.5–5.1)
Sodium: 140 mEq/L (ref 135–145)

## 2021-04-02 LAB — LIPID PANEL
Cholesterol: 142 mg/dL (ref 0–200)
HDL: 45.3 mg/dL (ref >39.00)
LDL Cholesterol: 81 mg/dL (ref 0–99)
NonHDL: 97.19
Total CHOL/HDL Ratio: 3
Triglycerides: 82 mg/dL (ref 0.0–149.0)
VLDL: 16.4 mg/dL (ref 0.0–40.0)

## 2021-04-02 LAB — HEPATIC FUNCTION PANEL
ALT: 44 U/L (ref 0–53)
AST: 25 U/L (ref 0–37)
Albumin: 4.5 g/dL (ref 3.5–5.2)
Alkaline Phosphatase: 88 U/L (ref 39–117)
Bilirubin, Direct: 0.2 mg/dL (ref 0.0–0.3)
Total Bilirubin: 0.9 mg/dL (ref 0.2–1.2)
Total Protein: 7.1 g/dL (ref 6.0–8.3)

## 2021-04-02 LAB — MICROALBUMIN / CREATININE URINE RATIO
Creatinine,U: 106.1 mg/dL
Microalb Creat Ratio: 0.7 mg/g (ref 0.0–30.0)
Microalb, Ur: <0.7 mg/dL (ref 0.0–1.9)

## 2021-04-02 LAB — HEMOGLOBIN A1C: Hgb A1c MFr Bld: 7.9 % — ABNORMAL HIGH (ref 4.6–6.5)

## 2021-04-02 MED ORDER — LORAZEPAM 0.5 MG PO TABS: 0.5000 mg | ORAL_TABLET | Freq: Three times a day (TID) | ORAL | 1 refills | Status: DC | PRN

## 2021-04-02 MED ORDER — SILDENAFIL CITRATE 100 MG PO TABS: 50.0000 mg | ORAL_TABLET | Freq: Every day | ORAL | 11 refills | Status: DC | PRN

## 2021-04-02 NOTE — Patient Instructions (Signed)
Shingrix

## 2021-04-02 NOTE — Progress Notes (Signed)
? ? ?Charles Hunnell T. [name], MD, [facility] ?Therapist, music at [facility] ?[place] ?[place], [address] ? ?Phone: [phone]  FAX: [phone] ? ?Charles [name] - 66 y.o. male  MRN [id number]  Date of Birth: [birth year] ? ?Date: 04/02/2021  PCP: Owens Loffler, MD  Referral: Owens Loffler, MD ? ?Chief Complaint  ?Patient presents with  ? Medicare Wellness  ? ? ?This visit occurred during the SARS-CoV-2 public health emergency.  Safety protocols were in place, including screening questions prior to the visit, additional usage of staff PPE, and extensive cleaning of exam room while observing appropriate contact time as indicated for disinfecting solutions.  ? ?Patient Care Team: ?Owens Loffler, MD as PCP - General (Family Medicine) ?Subjective:  ? ?Charles [name] is a 66 y.o. pleasant patient who presents for a medicare wellness examination: ? ?Preventative Health Maintenance Visit: ? ?Health Maintenance Summary Reviewed and updated, unless pt declines services. ? ?Tobacco History Reviewed. ?Alcohol: none ?Exercise Habits: He has been able to remain active, walk, and he is playing golf roughly 3 times a week  ?STD concerns: no risk or activity to increase risk ?Drug Use: None ? ?He has had a lot of health problems over the last couple of years. ?He was diagnosed with lymphoma, he is also diagnosed with prostate cancer. ? ?Additional health maintenance: ?Shingrix ? ?He also has diabetes.  He has not had recent checkup here.  Currently this has been diet controlled. ? ?Diabetes Mellitus: Diet controlled  ?compliance with diet: fair/good, Body mass index is 33.27 kg/m?Marland Kitchen ?Avg blood sugars at home: not checking ?Foot problems: none ?Hypoglycemia: none ?No nausea, vomitting, blurred vision, polyuria. ? ?Lab Results  ?Component Value Date  ? HGBA1C 7.9 (H) 04/02/2021  ? HGBA1C 6.4 08/28/2019  ? HGBA1C 6.2 12/20/2017  ? ?Lab Results  ?Component Value Date  ? MICROALBUR <0.7  04/02/2021  ? [place] 81 04/02/2021  ? CREATININE 0.84 04/02/2021  ? ? ?Wt Readings from Last 3 Encounters:  ?04/02/21 228 lb 9 oz (103.7 kg)  ?03/06/21 225 lb (102.1 kg)  ?10/09/20 222 lb (100.7 kg)  ?  ? ?Has been playing golf a couple of times a week ?[place] national  ? ?Health Maintenance  ?Topic Date Due  ? Zoster Vaccines- Shingrix (1 of 2) Never done  ? FOOT EXAM  08/30/2020  ? OPHTHALMOLOGY EXAM  08/30/2020  ? Pneumonia Vaccine 39+ Years old (2 - PCV) 09/07/2020  ? HEMOGLOBIN A1C  10/03/2021  ? URINE MICROALBUMIN  04/03/2022  ? TETANUS/TDAP  02/26/2026  ? COLONOSCOPY (Pts 45-53yr Insurance coverage will need to be confirmed)  05/22/2027  ? INFLUENZA VACCINE  Completed  ? COVID-19 Vaccine  Completed  ? Hepatitis C Screening  Completed  ? HPV VACCINES  Aged Out  ? ? ?Immunization History  ?Administered Date(s) Administered  ? Fluad Quad(high Dose 65+) 10/28/2020  ? Influenza, High Dose Seasonal PF 09/08/2019  ? Influenza,inj,Quad PF,6+ Mos 10/18/2014, 10/26/2016, 11/19/2017, 09/20/2018  ? PFIZER(Purple Top)SARS-COV-2 Vaccination 02/10/2019, 03/07/2019, 11/16/2019  ? PPension scheme manager173yr& up 10/28/2020  ? Pneumococcal Polysaccharide-23 10/26/2016, 09/08/2019  ? Tdap 02/27/2016  ? ? ?Patient Active Problem List  ? Diagnosis Date Noted  ? Prostate cancer ([place]03/29/2023  ?  Priority: High  ? Marginal zone B-cell lymphoma ([facility] [place]06/06/2020  ?  Priority: High  ? Hyperlipidemia 10/21/2014  ?  Priority: Medium   ? Generalized anxiety disorder 10/21/2014  ?  Priority: Medium   ?  Controlled type 2 diabetes mellitus without complication, without long-term current use of insulin ([place])   ?  Priority: Medium   ? History of skin cancer 06/10/2020  ? GERD (gastroesophageal reflux disease)   ? ? ?Past Medical History:  ?Diagnosis Date  ? Anxiety   ? Controlled type 2 diabetes mellitus without complication, without long-term current use of insulin ([place])   ? Diverticulitis   ? Generalized anxiety  disorder 10/21/2014  ? GERD (gastroesophageal reflux disease)   ? Marginal zone B-cell lymphoma ([place]) 06/10/2020  ? Prostate cancer ([place]) 04/02/2021  ? ? ?Past Surgical History:  ?Procedure Laterality Date  ? ACHILLES TENDON REPAIR  2001  ? BICEPS TENDON REPAIR Left 2014  ? COLONOSCOPY  2012  ? polyps-[place]  ? HEMORROIDECTOMY  2012  ? ROTATOR CUFF REPAIR Left 2014  ? TOTAL HIP ARTHROPLASTY Right 12/07/2014  ? Procedure: RIGHT TOTAL HIP ARTHROPLASTY ANTERIOR APPROACH;  Surgeon: Mcarthur Rossetti, MD;  Location: [facility];  Service: Orthopedics;  Laterality: Right;  ? ? ?Family History  ?Problem Relation Age of Onset  ? Alcohol abuse Father   ? Heart attack Father   ? Hyperlipidemia Father   ? Stroke Father   ? Hypertension Father   ? Breast cancer Mother 4  ? Hypertension Mother   ? Colon cancer Neg Hx   ? Colon polyps Neg Hx   ? Esophageal cancer Neg Hx   ? Rectal cancer Neg Hx   ? Stomach cancer Neg Hx   ? ? ?Past Medical History, Surgical History, Social History, Family History, Problem List, Medications, and Allergies have been reviewed and updated if relevant. ? ?Review of Systems: Pertinent positives are listed above.  Otherwise, a full 14 point review of systems has been done in full and it is negative except where it is noted positive. ? ?Objective:  ? ?BP 120/80   Pulse 71   Temp 97.7 ?F (36.5 ?C) (Oral)   Ht 5' 9.5" (1.765 m)   Wt 228 lb 9 oz (103.7 kg)   SpO2 97%   BMI 33.27 kg/m?  ? ?  08/31/2019  ?  8:36 AM 09/16/2019  ?  3:12 PM 06/10/2020  ?  2:26 PM 06/24/2020  ?  1:43 PM 04/02/2021  ?  9:09 AM  ?Fall Risk  ?Falls in the past year? 0    0  ?Patient Fall Risk Level  Low fall risk Low fall risk Low fall risk   ? ?Ideal Body Weight:  ?Weight in (lb) to have BMI = 25: 171.4 ?Hearing Screening  ?Method: Audiometry  ? '500Hz'$  '1000Hz'$  '2000Hz'$  '4000Hz'$   ?Right ear '20 20 20 25  '$ ?Left ear '20 25 25 25  '$ ?Vision Screening - Comments:: Last eye exam with [place] 2021 ? ?  04/02/2021  ?  9:10 AM 08/31/2019  ?  8:36 AM  12/23/2017  ?  9:50 AM 07/15/2016  ?  4:22 PM  ?Depression screen PHQ 2/9  ?Decreased Interest 0 0 0 2  ?Down, Depressed, Hopeless 0 0 0 2  ?PHQ - 2 Score 0 0 0 4  ?Altered sleeping    3  ?Tired, decreased energy    3  ?Change in appetite    3  ?Feeling bad or failure about yourself     3  ?Trouble concentrating    3  ?Moving slowly or fidgety/restless    3  ?Suicidal thoughts    1  ?PHQ-9 Score    23  ? ? ? ?GEN:  well developed, well nourished, no acute distress ?Eyes: conjunctiva and lids normal, PERRLA, EOMI ?ENT: TM clear, nares clear, oral exam WNL ?Neck: supple, no lymphadenopathy, no thyromegaly, no JVD ?Pulm: clear to auscultation and percussion, respiratory effort normal ?CV: regular rate and rhythm, S1-S2, no murmur, rub or gallop, no bruits, peripheral pulses normal and symmetric, no cyanosis, clubbing, edema or varicosities ?GI: soft, non-tender; no hepatosplenomegaly, masses; active bowel sounds all quadrants ?GU: deferred ?Lymph: no cervical, axillary or inguinal adenopathy ?MSK: gait normal, muscle tone and strength WNL, no joint swelling, effusions, discoloration, crepitus  ?SKIN: clear, good turgor, color WNL, no rashes, lesions, or ulcerations ?Neuro: normal mental status, normal strength, sensation, and motion ?Psych: alert; oriented to person, place and time, normally interactive and not anxious or depressed in appearance. ? ?All labs reviewed with patient. ?These had to be obtained at the time of his office visit ? ? ?Assessment and Plan:  ? ?  ICD-10-CM   ?1. Healthcare maintenance  Z00.00   ?  ?2. Prostate cancer ([place])  C61 PSA, Total with Reflex to PSA, Free  ?  ?3. Mixed hyperlipidemia  E78.2 Lipid panel  ?  ?4. Controlled type 2 diabetes mellitus without complication, without long-term current use of insulin (HCC)  E11.9 Hemoglobin A1c  ?  Microalbumin / creatinine urine ratio  ?  ?5. Encounter for long-term current use of medication  S56.812 Basic metabolic panel  ?  CBC with  Differential/Platelet  ?  Hepatic function panel  ?  ? ?Mikki Santee is generally doing well, he has been able to be quite active.  Unfortunately, he has had quite a bit of health issues over the last 2 years with lymphoma diagnosis as well

## 2021-04-03 ENCOUNTER — Encounter: Payer: Self-pay | Admitting: Family Medicine

## 2021-04-03 DIAGNOSIS — E119 Type 2 diabetes mellitus without complications: Secondary | ICD-10-CM

## 2021-04-04 LAB — PSA, TOTAL WITH REFLEX TO PSA, FREE: PSA, Total: 2.2 ng/mL (ref <=4.0)

## 2021-04-05 NOTE — Telephone Encounter (Signed)
Can you help send him in glucometer, strips, lancets on Monday? ?

## 2021-04-07 MED ORDER — ACCU-CHEK SOFTCLIX LANCETS MISC: 5 refills | Status: DC

## 2021-04-07 MED ORDER — BD SWAB SINGLE USE REGULAR PADS: MEDICATED_PAD | 5 refills | Status: AC

## 2021-04-07 MED ORDER — ACCU-CHEK GUIDE VI STRP: ORAL_STRIP | 5 refills | Status: DC

## 2021-04-07 MED ORDER — ACCU-CHEK GUIDE W/DEVICE KIT: PACK | 0 refills | Status: DC

## 2021-04-07 NOTE — Addendum Note (Signed)
Addended by: Carter Kitten on: 04/07/2021 02:59 PM ? ? Modules accepted: Orders ? ?

## 2021-04-07 NOTE — Telephone Encounter (Signed)
Can you help with that - thanks! ?

## 2021-04-08 ENCOUNTER — Other Ambulatory Visit: Payer: Medicare HMO

## 2021-04-09 ENCOUNTER — Ambulatory Visit: Payer: Medicare HMO | Admitting: Urology

## 2021-04-09 MED ORDER — ONETOUCH DELICA PLUS LANCET30G MISC: 5 refills | Status: DC

## 2021-04-09 MED ORDER — ONETOUCH VERIO VI STRP: ORAL_STRIP | 5 refills | Status: DC

## 2021-04-09 NOTE — Addendum Note (Signed)
Addended by: Carter Kitten on: 04/09/2021 12:21 PM ? ? Modules accepted: Orders ? ?

## 2021-04-09 NOTE — Addendum Note (Signed)
Addended by: Carter Kitten on: 04/09/2021 02:36 PM ? ? Modules accepted: Orders ? ?

## 2021-04-09 NOTE — Addendum Note (Signed)
Addended by: Carter Kitten on: 04/09/2021 12:20 PM ? ? Modules accepted: Orders ? ?

## 2021-04-16 ENCOUNTER — Ambulatory Visit (INDEPENDENT_AMBULATORY_CARE_PROVIDER_SITE_OTHER): Payer: Medicare HMO | Admitting: Urology

## 2021-04-16 ENCOUNTER — Encounter: Payer: Self-pay | Admitting: Urology

## 2021-04-16 ENCOUNTER — Ambulatory Visit: Payer: Medicare HMO | Admitting: Urology

## 2021-04-16 VITALS — BP 138/80 | HR 61 | Ht 70.0 in | Wt 223.0 lb

## 2021-04-16 DIAGNOSIS — C61 Malignant neoplasm of prostate: Secondary | ICD-10-CM | POA: Diagnosis not present

## 2021-04-16 NOTE — Progress Notes (Signed)
?  04/16/2021 ?12:49 PM  ?  ?Charles [name] ?02-26-1955 ?[id number] ?  ?Reason for visit: Low risk prostate cancer, review prostate MRI ?  ?HPI: ?66 year old male diagnosed with B-cell lymphoma on colonoscopy and underwent a PET/CT for staging imaging.  This showed no abnormalities aside from a focal area of intake in the left hemiprostate.  PSA was 1.8 and he opted for prostate biopsy. ?  ?Biopsy on 10/02/2020 showed a 46 g prostate with a PSA density of 0.04, no abnormalities seen on transrectal ultrasound.  Biopsies showed only 1/12 cores positive for Gleason score 3+3= 6 prostate adenocarcinoma with maximal involvement of 10%, that correlated with the location of the abnormality on PET/CT in the left side. ?  ?He opted for active surveillance with his low risk disease, normal PSA, and only single core showing low risk disease. ?  ?Repeat PSA on 04/02/2021 remains low at 2.2 and within the normal range from 1.8 prior.  He also had a prostate MRI which I personally viewed and interpreted and shows no correlating lesion with the prior PET/CT findings.  There is a single PI-RADS 3 lesion in the right posterior peripheral zone at the junction of the mid gland and apex.  Overall no suspicious findings. ?  ?Reassurance was provided regarding his low risk prostate cancer, stable PSA, and reassuring MRI findings.  We will continue active surveillance, repeat PSA in 6 months.  If stable likely can space to yearly PSA, and consider repeat biopsy in 1 to 2 years. ?  ?Billey Co, MD ?  ?[place] ?[address], [address] ?[place], [place] [address] ?[phone] ?  ?

## 2021-04-16 NOTE — Progress Notes (Signed)
? ?  04/16/2021 ?12:49 PM  ? ?Charles [name] ?August 23, 1955 ?[id number] ? ?Reason for visit: Low risk prostate cancer, review prostate MRI ? ?HPI: ?66 year old male diagnosed with B-cell lymphoma on colonoscopy and underwent a PET/CT for staging imaging.  This showed no abnormalities aside from a focal area of intake in the left hemiprostate.  PSA was 1.8 and he opted for prostate biopsy. ? ?Biopsy on 10/02/2020 showed a 46 g prostate with a PSA density of 0.04, no abnormalities seen on transrectal ultrasound.  Biopsies showed only 1/12 cores positive for Gleason score 3+3= 6 prostate adenocarcinoma with maximal involvement of 10%, that correlated with the location of the abnormality on PET/CT in the left side. ? ?He opted for active surveillance with his low risk disease, normal PSA, and only single core showing low risk disease. ? ?Repeat PSA on 04/02/2021 remains low at 2.2 and within the normal range from 1.8 prior.  He also had a prostate MRI which I personally viewed and interpreted and shows no correlating lesion with the prior PET/CT findings.  There is a single PI-RADS 3 lesion in the right posterior peripheral zone at the junction of the mid gland and apex.  Overall no suspicious findings. ? ?Reassurance was provided regarding his low risk prostate cancer, stable PSA, and reassuring MRI findings.  We will continue active surveillance, repeat PSA in 6 months.  If stable likely can space to yearly PSA, and consider repeat biopsy in 1 to 2 years. ? ?Billey Co, MD ? ?[facility] ?[address], [address] ?[place], [place] [address] ?[phone] ? ?

## 2021-05-15 ENCOUNTER — Encounter: Payer: Self-pay | Admitting: Family Medicine

## 2021-05-16 NOTE — Telephone Encounter (Signed)
See mychart.  

## 2021-05-20 ENCOUNTER — Encounter: Payer: Self-pay | Admitting: Family Medicine

## 2021-05-21 LAB — HM DIABETES EYE EXAM

## 2021-05-23 ENCOUNTER — Encounter: Payer: Self-pay | Admitting: Primary Care

## 2021-06-21 ENCOUNTER — Other Ambulatory Visit: Payer: Self-pay | Admitting: Hematology and Oncology

## 2021-06-21 DIAGNOSIS — C8580 Other specified types of non-Hodgkin lymphoma, unspecified site: Secondary | ICD-10-CM

## 2021-06-24 ENCOUNTER — Inpatient Hospital Stay: Payer: Medicare HMO | Attending: Hematology and Oncology | Admitting: Hematology and Oncology

## 2021-06-24 ENCOUNTER — Inpatient Hospital Stay: Payer: Medicare HMO

## 2021-06-24 ENCOUNTER — Other Ambulatory Visit: Payer: Self-pay

## 2021-06-24 ENCOUNTER — Encounter: Payer: Self-pay | Admitting: Hematology and Oncology

## 2021-06-24 DIAGNOSIS — Z8572 Personal history of non-Hodgkin lymphomas: Secondary | ICD-10-CM | POA: Insufficient documentation

## 2021-06-24 DIAGNOSIS — E669 Obesity, unspecified: Secondary | ICD-10-CM

## 2021-06-24 DIAGNOSIS — C8580 Other specified types of non-Hodgkin lymphoma, unspecified site: Secondary | ICD-10-CM

## 2021-06-24 DIAGNOSIS — C61 Malignant neoplasm of prostate: Secondary | ICD-10-CM

## 2021-06-24 LAB — CMP (CANCER CENTER ONLY)
ALT: 58 U/L — ABNORMAL HIGH (ref 0–44)
AST: 29 U/L (ref 15–41)
Albumin: 4.2 g/dL (ref 3.5–5.0)
Alkaline Phosphatase: 84 U/L (ref 38–126)
Anion gap: 5 (ref 5–15)
BUN: 23 mg/dL (ref 8–23)
CO2: 27 mmol/L (ref 22–32)
Calcium: 9.7 mg/dL (ref 8.9–10.3)
Chloride: 105 mmol/L (ref 98–111)
Creatinine: 0.83 mg/dL (ref 0.61–1.24)
GFR, Estimated: >60 mL/min (ref >60)
Glucose, Bld: 173 mg/dL — ABNORMAL HIGH (ref 70–99)
Potassium: 4.5 mmol/L (ref 3.5–5.1)
Sodium: 137 mmol/L (ref 135–145)
Total Bilirubin: 0.8 mg/dL (ref 0.3–1.2)
Total Protein: 7.4 g/dL (ref 6.5–8.1)

## 2021-06-24 LAB — CBC WITH DIFFERENTIAL (CANCER CENTER ONLY)
Abs Immature Granulocytes: 0.02 K/uL (ref 0.00–0.07)
Basophils Absolute: 0.1 K/uL (ref 0.0–0.1)
Basophils Relative: 1 %
Eosinophils Absolute: 0.7 K/uL — ABNORMAL HIGH (ref 0.0–0.5)
Eosinophils Relative: 11 %
HCT: 44.8 % (ref 39.0–52.0)
Hemoglobin: 15.5 g/dL (ref 13.0–17.0)
Immature Granulocytes: 0 %
Lymphocytes Relative: 19 %
Lymphs Abs: 1.2 K/uL (ref 0.7–4.0)
MCH: 31.3 pg (ref 26.0–34.0)
MCHC: 34.6 g/dL (ref 30.0–36.0)
MCV: 90.3 fL (ref 80.0–100.0)
Monocytes Absolute: 0.6 K/uL (ref 0.1–1.0)
Monocytes Relative: 10 %
Neutro Abs: 3.6 K/uL (ref 1.7–7.7)
Neutrophils Relative %: 59 %
Platelet Count: 206 K/uL (ref 150–400)
RBC: 4.96 MIL/uL (ref 4.22–5.81)
RDW: 12.9 % (ref 11.5–15.5)
WBC Count: 6.1 K/uL (ref 4.0–10.5)
nRBC: 0 % (ref 0.0–0.2)

## 2021-06-24 NOTE — Progress Notes (Signed)
[facility] OFFICE PROGRESS NOTE  Patient Care Team: Owens Loffler, MD as PCP - General (Family Medicine)  ASSESSMENT & PLAN:  Marginal zone B-cell lymphoma ([place]) The patient has stage I E marginal zone lymphoma He has no residual disease noted on PET CT scan from last year The patient is likely cured by polypectomy Recommend repeat colonoscopy in 1 year He has not scheduled that yet; I reminded him to call his gastroenterologist I will see him back in a year for further follow-up He does not need adjuvant treatment  Prostate cancer [facility]) He is on observation We discussed importance of weight loss for risk reduction strategy  Class 1 obesity He is made aware that obesity is associated with risk of lymphoma and prostate cancer I recommend the patient to modify his diet and lifestyle with goal to get his 10% of his weight off  Orders Placed This Encounter  Procedures   CBC with Differential ([place] Only)    Standing Status:   Future    Standing Expiration Date:   06/25/2022   CMP ([place] only)    Standing Status:   Future    Standing Expiration Date:   06/25/2022    All questions were answered. The patient knows to call the clinic with any problems, questions or concerns. The total time spent in the appointment was 20 minutes encounter with patients including review of chart and various tests results, discussions about plan of care and coordination of care plan   [name], MD 06/24/2021 12:12 PM  INTERVAL HISTORY: Please see below for problem oriented charting. he returns for surveillance follow-up for history of stage I E lymphoma and prostate cancer He is on observation for prostate cancer The patient has gained weight since last time I saw him He had no recent skin cancer diagnosis No new lymphadenopathy  REVIEW OF SYSTEMS:   Constitutional: Denies fevers, chills or abnormal weight loss Eyes: Denies blurriness of vision Ears, nose,  mouth, throat, and face: Denies mucositis or sore throat Respiratory: Denies cough, dyspnea or wheezes Cardiovascular: Denies palpitation, chest discomfort or lower extremity swelling Gastrointestinal:  Denies nausea, heartburn or change in bowel habits Skin: Denies abnormal skin rashes Lymphatics: Denies new lymphadenopathy or easy bruising Neurological:Denies numbness, tingling or new weaknesses Behavioral/Psych: Mood is stable, no new changes  All other systems were reviewed with the patient and are negative.  I have reviewed the past medical history, past surgical history, social history and family history with the patient and they are unchanged from previous note.  ALLERGIES:  has No Known Allergies.  MEDICATIONS:  Current Outpatient Medications  Medication Sig Dispense Refill   Alcohol Swabs (B-D SINGLE USE SWABS REGULAR) PADS Use to check blood sugar up to 2 times a day 100 each 5   aspirin EC 81 MG tablet Take 81 mg by mouth daily.     atorvastatin (LIPITOR) 40 MG tablet TAKE 1 TABLET BY MOUTH EVERY DAY 90 tablet 3   Cetirizine HCl 10 MG CAPS      CLINPRO 5000 1.1 % PSTE Place onto teeth daily.     Flaxseed, Linseed, (FLAX SEED OIL PO) Take by mouth.     glucose blood (ONETOUCH VERIO) test strip Use to check blood sugar up to 2 times a day. 100 each 5   Lancets (ONETOUCH DELICA PLUS [id number]) MISC Use to check blood sugar up to 2 times a day. 100 each 5   LORazepam (ATIVAN) 0.5 MG tablet Take  1 tablet (0.5 mg total) by mouth every 8 (eight) hours as needed. for anxiety 30 tablet 1   Multiple Vitamins-Minerals (MULTIVITAMIN WITH MINERALS) tablet Take 1 tablet by mouth daily.     Pseudoephedrine HCl (SUDAFED PO) Take 1 tablet by mouth daily as needed (allergies).      sildenafil (VIAGRA) 100 MG tablet Take 0.5-1 tablets (50-100 mg total) by mouth daily as needed for erectile dysfunction. 5 tablet 11   No current facility-administered medications for this visit.    SUMMARY OF  ONCOLOGIC HISTORY: Oncology History  Marginal zone B-cell lymphoma ([place])  05/21/2020 Procedure   Colonoscopy by Dr. Hilarie Fredrickson - One 4 mm polyp in the ascending colon, removed with a cold snare. Resected and retrieved. - One 25 mm polyp in the proximal transverse colon, removed piecemeal using a cold snare. Resected and retrieved. Tattooed. - Diverticulosis from cecum to sigmoid colon. - The distal rectum and anal verge are normal on retroflexion view.   05/21/2020 Pathology Results   1. Surgical [P], colon, ascending, polyp (1) - TUBULAR ADENOMA. - NO HIGH GRADE DYSPLASIA OR CARCINOMA. 2. Surgical [P], colon, transverse, polyp (1) - ATYPICAL LYMPHOID PROLIFERATION. - SEE MICROSCOPIC DESCRIPTION. Microscopic Comment 2. The sections show a dense, diffuse and relatively monomorphic lymphoid infiltrate involving the lamina propria and submucosa and mostly characterized by small to medium sized lymphocytes with round to slightly irregular nuclei and moderately abundant clear cytoplasm imparting a monocytoid appearance. The infiltrate displays scattered small foci of lymphoepithelial lesions and occasionally surround very small "naked" germinal centers. A battery of immunohistochemical stains was performed and shows that the lymphoid infiltrate is predominantly composed of B cells as seen with CD20 associated with diffuse positivity for Bcl-2 and CD43. No significant CD10 or cyclin D1 positivity is identified. BCL6 highlights scattering of small positive clusters correlating with previously described germinal centers. Ki-67 shows very low expression (less than 10%). In situ hybridization for kappa and lambda highlight a polyclonal superficial plasma cell component. The findings are highly suspicious for involvement by low-grade B-cell lymphoma particularly extranodal marginal zone lymphoma. FISH and gene rearrangement study studies will be performed and the results reported in an addendum. Dr. Gari Crown reviewed  this part and cocnurs. Claudette Laws MD Pathologist, Electronic Signature (Case signed 05/27/2020)    06/24/2020 PET scan   No signs of nodal disease in the neck, chest, abdomen or pelvis.   Focal area of uptake in the LEFT hemi prostate, nonspecific but raising the question of prostate neoplasm. Urologic consultation is suggested for further evaluation.   Aortic atherosclerosis and coronary artery disease.   Maxillary sinus disease.   Aortic Atherosclerosis (ICD10-I70.0).   06/24/2020 Cancer Staging   Staging form: Hodgkin and Non-Hodgkin Lymphoma, AJCC 8th Edition - Clinical stage from 06/24/2020: Stage IE (Marginal zone lymphoma) - Signed by [name], MD on 06/24/2020 Stage prefix: Initial diagnosis   Prostate cancer ([place])  04/02/2021 Initial Diagnosis   Prostate cancer ([place])   06/24/2021 Cancer Staging   Staging form: Prostate, AJCC 8th Edition - Clinical stage from 06/24/2021: cT1, cN0, cM0, Grade Group: 1 - Signed by [name], MD on 06/24/2021 Stage prefix: Initial diagnosis Histologic grading system: 5 grade system     PHYSICAL EXAMINATION: ECOG PERFORMANCE STATUS: 0 - Asymptomatic  Vitals:   06/24/21 1048  BP: 136/81  Pulse: 64  Resp: 18  Temp: (!) 97.5 F (36.4 C)  SpO2: 95%   Filed Weights   06/24/21 1048  Weight: 226 lb 12.8 oz (102.9  kg)    GENERAL:alert, no distress and comfortable SKIN: skin color, texture, turgor are normal, no rashes or significant lesions EYES: normal, Conjunctiva are pink and non-injected, sclera clear OROPHARYNX:no exudate, no erythema and lips, buccal mucosa, and tongue normal  NECK: supple, thyroid normal size, non-tender, without nodularity LYMPH:  no palpable lymphadenopathy in the cervical, axillary or inguinal LUNGS: clear to auscultation and percussion with normal breathing effort HEART: regular rate & rhythm and no murmurs and no lower extremity edema ABDOMEN:abdomen soft, non-tender and normal bowel  sounds Musculoskeletal:no cyanosis of digits and no clubbing  NEURO: alert & oriented x 3 with fluent speech, no focal motor/sensory deficits  LABORATORY DATA:  I have reviewed the data as listed    Component Value Date/Time   NA 137 06/24/2021 1038   NA 139 01/11/2020 1249   K 4.5 06/24/2021 1038   CL 105 06/24/2021 1038   CO2 27 06/24/2021 1038   GLUCOSE 173 (H) 06/24/2021 1038   BUN 23 06/24/2021 1038   BUN 14 01/11/2020 1249   CREATININE 0.83 06/24/2021 1038   CALCIUM 9.7 06/24/2021 1038   PROT 7.4 06/24/2021 1038   ALBUMIN 4.2 06/24/2021 1038   AST 29 06/24/2021 1038   ALT 58 (H) 06/24/2021 1038   ALKPHOS 84 06/24/2021 1038   BILITOT 0.8 06/24/2021 1038   GFRNONAA >60 06/24/2021 1038   GFRAA 91 01/11/2020 1249    No results found for: "SPEP", "UPEP"  Lab Results  Component Value Date   WBC 6.1 06/24/2021   NEUTROABS 3.6 06/24/2021   HGB 15.5 06/24/2021   HCT 44.8 06/24/2021   MCV 90.3 06/24/2021   PLT 206 06/24/2021      Chemistry      Component Value Date/Time   NA 137 06/24/2021 1038   NA 139 01/11/2020 1249   K 4.5 06/24/2021 1038   CL 105 06/24/2021 1038   CO2 27 06/24/2021 1038   BUN 23 06/24/2021 1038   BUN 14 01/11/2020 1249   CREATININE 0.83 06/24/2021 1038      Component Value Date/Time   CALCIUM 9.7 06/24/2021 1038   ALKPHOS 84 06/24/2021 1038   AST 29 06/24/2021 1038   ALT 58 (H) 06/24/2021 1038   BILITOT 0.8 06/24/2021 1038

## 2021-06-24 NOTE — Assessment & Plan Note (Signed)
The patient has stage I E marginal zone lymphoma He has no residual disease noted on PET CT scan from last year The patient is likely cured by polypectomy Recommend repeat colonoscopy in 1 year He has not scheduled that yet; I reminded him to call his gastroenterologist I will see him back in a year for further follow-up He does not need adjuvant treatment

## 2021-06-24 NOTE — Assessment & Plan Note (Signed)
He is on observation We discussed importance of weight loss for risk reduction strategy

## 2021-06-24 NOTE — Assessment & Plan Note (Signed)
He is made aware that obesity is associated with risk of lymphoma and prostate cancer I recommend the patient to modify his diet and lifestyle with goal to get his 10% of his weight off

## 2021-06-25 ENCOUNTER — Encounter: Payer: Self-pay | Admitting: Family Medicine

## 2021-06-25 ENCOUNTER — Telehealth: Payer: Self-pay

## 2021-06-25 ENCOUNTER — Encounter: Payer: Self-pay | Admitting: Hematology and Oncology

## 2021-06-25 NOTE — Telephone Encounter (Signed)
Faxed office not to Dr. Hilarie Fredrickson at [phone], received fax confirmation.

## 2021-07-11 ENCOUNTER — Encounter: Payer: Self-pay | Admitting: Family Medicine

## 2021-07-14 ENCOUNTER — Ambulatory Visit (INDEPENDENT_AMBULATORY_CARE_PROVIDER_SITE_OTHER)
Admission: RE | Admit: 2021-07-14 | Discharge: 2021-07-14 | Disposition: A | Discharge status: Home / Self Care | Payer: Medicare HMO | Source: Ambulatory Visit | Attending: Family Medicine | Admitting: Family Medicine

## 2021-07-14 ENCOUNTER — Encounter: Payer: Self-pay | Admitting: Family Medicine

## 2021-07-14 ENCOUNTER — Ambulatory Visit (INDEPENDENT_AMBULATORY_CARE_PROVIDER_SITE_OTHER): Payer: Medicare HMO | Admitting: Family Medicine

## 2021-07-14 VITALS — BP 100/60 | HR 61 | Temp 97.9°F | Ht 70.0 in | Wt 227.0 lb

## 2021-07-14 DIAGNOSIS — M1612 Unilateral primary osteoarthritis, left hip: Secondary | ICD-10-CM | POA: Diagnosis not present

## 2021-07-14 DIAGNOSIS — J320 Chronic maxillary sinusitis: Secondary | ICD-10-CM | POA: Diagnosis not present

## 2021-07-14 DIAGNOSIS — M169 Osteoarthritis of hip, unspecified: Secondary | ICD-10-CM | POA: Diagnosis not present

## 2021-07-14 DIAGNOSIS — E119 Type 2 diabetes mellitus without complications: Secondary | ICD-10-CM

## 2021-07-14 DIAGNOSIS — I878 Other specified disorders of veins: Secondary | ICD-10-CM | POA: Diagnosis not present

## 2021-07-14 LAB — POCT GLYCOSYLATED HEMOGLOBIN (HGB A1C): Hemoglobin A1C: 7.2 % — AB (ref 4.0–5.6)

## 2021-07-14 MED ORDER — MELOXICAM 15 MG PO TABS: 15.0000 mg | ORAL_TABLET | Freq: Every day | ORAL | 2 refills | Status: DC

## 2021-07-14 NOTE — Patient Instructions (Signed)
  Try some over the counter Zyrtec (Cetrizine) daily to see if it helps your sinuses.

## 2021-07-14 NOTE — Progress Notes (Signed)
Charles Hyslop T. [name], MD, [place] at [facility] [place], [address]  Phone: [phone]  FAX: [place] - 66 y.o. male  MRN [id number]  Date of Birth: 02-27-1955  Date: 07/14/2021  PCP: Owens Loffler, MD  Referral: Owens Loffler, MD  Chief Complaint  Patient presents with  . Hip Pain    Right   . Referral    ENT-CT showed sinus disease  . Diabetes   Subjective:   Charles [name] is a 66 y.o. very pleasant male patient with Body mass index is 32.57 kg/m. who presents with the following:  Diabetes Mellitus: Tolerating Medications: yes Compliance with diet: fair, Body mass index is 32.57 kg/m. Exercise: minimal / intermittent Avg blood sugars at home: not checking Foot problems: none Hypoglycemia: none No nausea, vomitting, blurred vision, polyuria.  Lab Results  Component Value Date   HGBA1C 7.2 (A) 07/14/2021   HGBA1C 7.9 (H) 04/02/2021   HGBA1C 6.4 08/28/2019   Lab Results  Component Value Date   MICROALBUR <0.7 04/02/2021   LDLCALC 81 04/02/2021   CREATININE 0.83 06/24/2021    Wt Readings from Last 3 Encounters:  07/14/21 227 lb (103 kg)  06/24/21 226 lb 12.8 oz (102.9 kg)  04/16/21 223 lb (101.2 kg)    Charles [name]'s had a longstanding issue for many years with some sinus problems and congestion.  Currently he is using some Flonase, and he also does use some intermittent Sudafed.  Does not recall if he is using antihistamine before. On a PET/CT done last year, the patient was found to have visualized sinus disease. He and his wife would like to talk to ENT about this more. Does use Flonase.  Left-sided hip pain in the anterior groin with a history of right-sided severe arthritis, status post right total hip arthroplasty.  He has had to restrict his walking some, and he is not able to play golf 3-4 times a week like he normally does due to some left-sided hip and groin  aching. Taking some motrin, a couple before golf.    Review of Systems is noted in the HPI, as appropriate  Objective:   BP 100/60   Pulse 61   Temp 97.9 F (36.6 C) (Oral)   Ht '5\' 10"'$  (1.778 m)   Wt 227 lb (103 kg)   SpO2 97%   BMI 32.57 kg/m   GEN: No acute distress; alert,appropriate. PULM: Breathing comfortably in no respiratory distress PSYCH: Normally interactive.  Left hip: Does have a positive C sign Abduction is limited somewhat secondary to pain The hip is unable to be flex fully to 90 degrees and he has roughly 22 degrees of rotational movement.  Does have pain with terminal internal range of motion. Strength is 5/5 throughout the lower extremities Neurovascularly intact  Laboratory and Imaging Data:  Assessment and Plan:     ICD-10-CM   1. Controlled type 2 diabetes mellitus without complication, without long-term current use of insulin (HCC)  E11.9 POCT glycosylated hemoglobin (Hb A1C)    2. Localized osteoarthritis of hip  M16.9 DG HIP UNILAT WITH PELVIS 2-3 VIEWS LEFT    3. Chronic maxillary sinusitis  J32.0 Ambulatory referral to ENT     Diabetes stable.  No changes.  History of chronic intermittent maxillary sinusitis.  I am going to have the patient start some Zyrtec along with his Flonase.  He would like to talk with ENT, I think that  is reasonable.  Left hip osteoarthritis, moderate in character.  I reviewed with him the patient's x-ray as well as his prior right-sided hip surgery prior to arthroplasty, and the left is certainly not at a point where it was on the right before.  I think for now, do some scheduled meloxicam as well as Tylenol.  I think that continuing with weight loss and strengthening of the lower extremities will also help.  We talked about some restrictions in terms of activity.  Medication Management during today's office visit: Meds ordered this encounter  Medications  . meloxicam (MOBIC) 15 MG tablet    Sig: Take 1 tablet (15  mg total) by mouth daily.    Dispense:  30 tablet    Refill:  2   There are no discontinued medications.  Orders placed today for conditions managed today: Orders Placed This Encounter  Procedures  . DG HIP UNILAT WITH PELVIS 2-3 VIEWS LEFT  . Ambulatory referral to ENT  . POCT glycosylated hemoglobin (Hb A1C)    Follow-up if needed: Return in about 6 months (around 01/14/2022) for diabetes recheck.  Dr[employer] One speech-to-text software was used for transcription in this dictation.  Possible transcriptional errors can occur using Editor, commissioning.   Signed,  Maud Deed. Danyiel Crespin, MD   Outpatient Encounter Medications as of 07/14/2021  Medication Sig  . Alcohol Swabs (B-D SINGLE USE SWABS REGULAR) PADS Use to check blood sugar up to 2 times a day  . aspirin EC 81 MG tablet Take 81 mg by mouth daily.  Marland Kitchen atorvastatin (LIPITOR) 40 MG tablet TAKE 1 TABLET BY MOUTH EVERY DAY  . Cetirizine HCl 10 MG CAPS   . CLINPRO 5000 1.1 % PSTE Place onto teeth daily.  . Flaxseed, Linseed, (FLAX SEED OIL PO) Take by mouth.  Marland Kitchen glucose blood (ONETOUCH VERIO) test strip Use to check blood sugar up to 2 times a day.  . Lancets (ONETOUCH DELICA PLUS [id number]) MISC Use to check blood sugar up to 2 times a day.  Marland Kitchen LORazepam (ATIVAN) 0.5 MG tablet Take 1 tablet (0.5 mg total) by mouth every 8 (eight) hours as needed. for anxiety  . meloxicam (MOBIC) 15 MG tablet Take 1 tablet (15 mg total) by mouth daily.  . Multiple Vitamins-Minerals (MULTIVITAMIN WITH MINERALS) tablet Take 1 tablet by mouth daily.  . Pseudoephedrine HCl (SUDAFED PO) Take 1 tablet by mouth daily as needed (allergies).   . sildenafil (VIAGRA) 100 MG tablet Take 0.5-1 tablets (50-100 mg total) by mouth daily as needed for erectile dysfunction.   No facility-administered encounter medications on file as of 07/14/2021.

## 2021-07-30 ENCOUNTER — Ambulatory Visit: Payer: Medicare HMO | Admitting: Family Medicine

## 2021-07-30 ENCOUNTER — Other Ambulatory Visit: Payer: Self-pay | Admitting: Family Medicine

## 2021-07-30 NOTE — Telephone Encounter (Signed)
Last office visit 07/14/21 for Hip Pain, DM and Sinusitis.  Last refilled 04/02/21 for #30 with 1 refill.  Next Appt: 01/14/22 for DM.

## 2021-07-31 ENCOUNTER — Encounter: Payer: Self-pay | Admitting: Family Medicine

## 2021-07-31 DIAGNOSIS — G629 Polyneuropathy, unspecified: Secondary | ICD-10-CM

## 2021-08-18 ENCOUNTER — Encounter: Payer: Self-pay | Admitting: Neurology

## 2021-09-12 ENCOUNTER — Encounter: Payer: Self-pay | Admitting: *Deleted

## 2021-09-17 ENCOUNTER — Ambulatory Visit (INDEPENDENT_AMBULATORY_CARE_PROVIDER_SITE_OTHER): Payer: Medicare HMO | Admitting: Internal Medicine

## 2021-09-17 ENCOUNTER — Encounter: Payer: Self-pay | Admitting: Internal Medicine

## 2021-09-17 VITALS — BP 130/72 | HR 66 | Ht 70.0 in | Wt 225.2 lb

## 2021-09-17 DIAGNOSIS — Z86010 Personal history of colonic polyps: Secondary | ICD-10-CM | POA: Diagnosis not present

## 2021-09-17 DIAGNOSIS — C884 Extranodal marginal zone B-cell lymphoma of mucosa-associated lymphoid tissue [MALT-lymphoma]: Secondary | ICD-10-CM | POA: Diagnosis not present

## 2021-09-17 MED ORDER — NA SULFATE-K SULFATE-MG SULF 17.5-3.13-1.6 GM/177ML PO SOLN: 1.0000 | Freq: Once | ORAL | 0 refills | Status: AC

## 2021-09-17 NOTE — Progress Notes (Signed)
   Subjective:    Patient ID: Charles [name], male    DOB: 1955-07-17, 66 y.o.   MRN: [id number]  HPI Charles [name] is a 66 year old male known to me after diagnosis of extranodal marginal zone B-cell lymphoma arising in a transverse colon polyp in May 2022, history of small tubular adenoma, colonic diverticulosis, early stage I prostate cancer on observation who is for follow-up.  He is here today with his wife.  He establish care with Dr. Alvy Bimler after diagnosis of extranodal marginal zone lymphoma arising from the transverse colon polyp.  This was felt to have been cured by polypectomy.  PET scan was reassuring though did reveal his prostate cancer.  Dr. Alvy Bimler is recommending a colonoscopy.  He feels well.  He has no abdominal complaint.  No diarrhea or change in bowel habit.  No blood in stool or melena.  Good appetite.  No upper GI or hepatobiliary complaint.  He has established with urology in [place] and they are going to do watchful waiting for early prostate cancer.  He continues to be active and specifically enjoys golf.  He likes to golf usually 3 to 4 days/week.  He plays the most [facility].   Review of Systems As per HPI, otherwise negative   Current Medications, Allergies, Past Medical History, Past Surgical History, Family History and Social History were reviewed in [employer] record.     Objective:   Physical Exam BP 130/72   Pulse 66   Ht '5\' 10"'$  (1.778 m)   Wt 225 lb 3.2 oz (102.2 kg)   BMI 32.31 kg/m  Gen: awake, alert, NAD HEENT: anicteric  CV: RRR, no mrg Pulm: CTA b/l Abd: soft, NT/ND, +BS throughout Ext: no c/c/e Neuro: nonfocal       Assessment & Plan:  66 year old male known to me after diagnosis of extranodal marginal zone B-cell lymphoma arising in a transverse colon polyp in May 2022, history of small tubular adenoma, colonic diverticulosis, early stage I prostate cancer on observation who is for follow-up.     Extranodal marginal zone B-cell lymphoma arising from transverse colon polyp (stage 1 E) --Dr. Alvy Bimler continues to see him for surveillance.  She feels that the lymphoma was likely cured by polypectomy.  We discussed surveillance colonoscopy including the risk and benefits and he is agreeable and wishes to proceed -- Surveillance colonoscopy in the [place] -- We discussed how there are no firm guidelines for surveillance protocol for this lymphoma but I will certainly involve Dr. Alvy Bimler in this decision.  2.  Prostate cancer --discovered incidentally by PET scan.  Sees urology and is currently in watchful waiting

## 2021-09-17 NOTE — Patient Instructions (Signed)
_______________________________________________________  If you are age 67 or older, your body mass index should be between 23-30. Your Body mass index is 32.31 kg/m. If this is out of the aforementioned range listed, please consider follow up with your Primary Care Provider.  If you are age 62 or younger, your body mass index should be between 19-25. Your Body mass index is 32.31 kg/m. If this is out of the aformentioned range listed, please consider follow up with your Primary Care Provider.   ________________________________________________________  The [place] [facility] providers would like to encourage you to use [facility] to communicate with providers for non-urgent requests or questions.  Due to long hold times on the telephone, sending your provider a message by [facility] may be a faster and more efficient way to get a response.  Please allow 48 business hours for a response.  Please remember that this is for non-urgent requests.  _______________________________________________________  Charles [name] have been scheduled for a colonoscopy. Please follow written instructions given to you at your visit today.  Please pick up your prep supplies at the pharmacy within the next 1-3 days. If you use inhalers (even only as needed), please bring them with you on the day of your procedure.

## 2021-09-22 ENCOUNTER — Encounter: Payer: Self-pay | Admitting: Internal Medicine

## 2021-09-30 ENCOUNTER — Encounter: Payer: Self-pay | Admitting: Internal Medicine

## 2021-09-30 ENCOUNTER — Encounter: Payer: Self-pay | Admitting: Hematology and Oncology

## 2021-09-30 ENCOUNTER — Other Ambulatory Visit: Payer: Self-pay | Admitting: Family Medicine

## 2021-09-30 ENCOUNTER — Ambulatory Visit (AMBULATORY_SURGERY_CENTER): Payer: Medicare HMO | Admitting: Internal Medicine

## 2021-09-30 VITALS — BP 112/70 | HR 55 | Temp 98.9°F | Resp 12 | Ht 70.0 in | Wt 225.0 lb

## 2021-09-30 DIAGNOSIS — C884 Extranodal marginal zone B-cell lymphoma of mucosa-associated lymphoid tissue [MALT-lymphoma]: Secondary | ICD-10-CM

## 2021-09-30 DIAGNOSIS — Z86010 Personal history of colonic polyps: Secondary | ICD-10-CM

## 2021-09-30 DIAGNOSIS — D120 Benign neoplasm of cecum: Secondary | ICD-10-CM

## 2021-09-30 DIAGNOSIS — Z85038 Personal history of other malignant neoplasm of large intestine: Secondary | ICD-10-CM | POA: Diagnosis not present

## 2021-09-30 DIAGNOSIS — D123 Benign neoplasm of transverse colon: Secondary | ICD-10-CM | POA: Diagnosis not present

## 2021-09-30 DIAGNOSIS — Z09 Encounter for follow-up examination after completed treatment for conditions other than malignant neoplasm: Secondary | ICD-10-CM | POA: Diagnosis not present

## 2021-09-30 DIAGNOSIS — K635 Polyp of colon: Secondary | ICD-10-CM | POA: Diagnosis not present

## 2021-09-30 DIAGNOSIS — Z08 Encounter for follow-up examination after completed treatment for malignant neoplasm: Secondary | ICD-10-CM | POA: Diagnosis not present

## 2021-09-30 DIAGNOSIS — K6389 Other specified diseases of intestine: Secondary | ICD-10-CM

## 2021-09-30 MED ORDER — SODIUM CHLORIDE 0.9 % IV SOLN: 500.0000 mL | Freq: Once | INTRAVENOUS | Status: DC

## 2021-09-30 NOTE — Progress Notes (Signed)
See office note dated 09/17/2021 for details of current H&P  Patient presenting for colonoscopy in the [facility] for surveillance of MALT of colon  He remains appropriate for colonoscopy here today

## 2021-09-30 NOTE — Op Note (Signed)
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 The patient's tolerance of                            previous anesthesia was also reviewed. The risks                            and benefits of the procedure and the sedation                            options and risks were discussed with the patient.                            All questions were answered, and informed consent                            was obtained. Prior Anticoagulants: The patient has                            taken no previous anticoagulant or antiplatelet                            agents. ASA Grade Assessment: II - A patient with                            mild systemic disease. After reviewing the risks                            and benefits, the patient was deemed in  satisfactory condition to undergo the procedure.                           After obtaining informed consent, the colonoscope                            was passed under direct vision. Throughout the                            procedure, the patient's blood pressure, pulse, and                            oxygen saturations were monitored continuously. The                            CF [id number] #[id number] was introduced  through the anus                            and advanced to the terminal ileum. The colonoscopy                            was performed without difficulty. The patient                            tolerated the procedure well. The quality of the                            bowel preparation was good. The terminal ileum,                            ileocecal valve, appendiceal orifice, and rectum                            were photographed. Scope In: 8:43:42 AM Scope Out: 8:58:15 AM Scope Withdrawal Time: 0 hours 12 minutes 56 seconds  Total Procedure Duration: 0 hours 14 minutes 33 seconds  Findings:                 The digital rectal exam was normal.                           The terminal ileum appeared normal.                           A 1 mm polyp was found in the cecum. The polyp was                            sessile. The polyp was removed with a cold biopsy                            forceps. Resection and retrieval were complete.                           A 7 mm post polypectomy scar with distal tattooing  was found in the proximal transverse colon. There                            was no evidence of the previous polyp. This area                            was biopsied with a cold forceps for histology to                            exclude residual neoplasia.                           A 4 mm polyp was found in the transverse colon. The                            polyp was sessile. The polyp was removed with a                            cold snare. Resection was complete, but the polyp                            tissue was not retrieved.                           Multiple small and large-mouthed diverticula were                            found from ascending colon to sigmoid colon.                           The retroflexed view of the distal rectum and anal                            verge was normal and showed no anal or rectal                             abnormalities. Complications:            No immediate complications. Estimated Blood Loss:     Estimated blood loss: none. Impression:               - The examined portion of the ileum was normal.                           - One 1 mm polyp in the cecum, removed with a cold                            biopsy forceps. Resected and retrieved.                           - Post-polypectomy scar with distal tattoo in the                            proximal transverse colon. No  evidence of residual                            polyp/tumor. Biopsied.                           - One 4 mm polyp in the transverse colon, removed                            with a cold snare. Complete resection. Polyp tissue                            not retrieved.                           - Moderate diverticulosis from ascending colon to                            sigmoid colon.                           - The distal rectum and anal verge are normal on                            retroflexion view. Recommendation:           - Patient has a contact number available for                            emergencies. The signs and symptoms of potential                            delayed complications were discussed with the                            patient. Return to normal activities tomorrow.                            Written discharge instructions were provided to the                            patient.                           - Resume previous diet.                           - Continue present medications.                           - Await pathology results.                           - Repeat colonoscopy is recommended for                            surveillance. The colonoscopy date will be  determined after pathology results from today's                            exam become available for review. Jerene Bears, MD 09/30/2021 9:05:20 AM This report has been signed electronically.

## 2021-09-30 NOTE — Progress Notes (Signed)
Report to PACU, RN, vss, BBS= Clear.  

## 2021-09-30 NOTE — Progress Notes (Signed)
Pt's states no medical or surgical changes since previsit or office visit. 

## 2021-09-30 NOTE — Telephone Encounter (Signed)
Last office visit 07/14/21 for DM/Hip Pain/referral.  Last refilled 07/14/21 for 330 with 2 refills.  Next Appt: 01/28/21 for DM.

## 2021-09-30 NOTE — Patient Instructions (Signed)
Handout on polyps and diverticulosis given.    YOU HAD AN ENDOSCOPIC PROCEDURE TODAY AT THE [facility]:   Refer to the procedure report that was given to you for any specific questions about what was found during the examination.  If the procedure report does not answer your questions, please call your gastroenterologist to clarify.  If you requested that your care partner not be given the details of your procedure findings, then the procedure report has been included in a sealed envelope for you to review at your convenience later.  YOU SHOULD EXPECT: Some feelings of bloating in the abdomen. Passage of more gas than usual.  Walking can help get rid of the air that was put into your GI tract during the procedure and reduce the bloating. If you had a lower endoscopy (such as a colonoscopy or flexible sigmoidoscopy) you may notice spotting of blood in your stool or on the toilet paper. If you underwent a bowel prep for your procedure, you may not have a normal bowel movement for a few days.  Please Note:  You might notice some irritation and congestion in your nose or some drainage.  This is from the oxygen used during your procedure.  There is no need for concern and it should clear up in a day or so.  SYMPTOMS TO REPORT IMMEDIATELY:  Following lower endoscopy (colonoscopy or flexible sigmoidoscopy):  Excessive amounts of blood in the stool  Significant tenderness or worsening of abdominal pains  Swelling of the abdomen that is new, acute  Fever of 100F or higher   For urgent or emergent issues, a gastroenterologist can be reached at any hour by calling [phone]. Do not use MyChart messaging for urgent concerns.    DIET:  We do recommend a small meal at first, but then you may proceed to your regular diet.  Drink plenty of fluids but you should avoid alcoholic beverages for 24 hours.  ACTIVITY:  You should plan to take it easy for the rest of today and you should NOT  DRIVE or use heavy machinery until tomorrow (because of the sedation medicines used during the test).    FOLLOW UP: Our staff will call the number listed on your records the next business day following your procedure.  We will call around 7:15- 8:00 am to check on you and address any questions or concerns that you may have regarding the information given to you following your procedure. If we do not reach you, we will leave a message.     If any biopsies were taken you will be contacted by phone or by letter within the next 1-3 weeks.  Please call us at [phone] if you have not heard about the biopsies in 3 weeks.    SIGNATURES/CONFIDENTIALITY: You and/or your care partner have signed paperwork which will be entered into your electronic medical record.  These signatures attest to the fact that that the information above on your After Visit Summary has been reviewed and is understood.  Full responsibility of the confidentiality of this discharge information lies with you and/or your care-partner. 

## 2021-09-30 NOTE — Progress Notes (Signed)
Called to room to assist during endoscopic procedure.  Patient ID and intended procedure confirmed with present staff. Received instructions for my participation in the procedure from the performing physician.  

## 2021-10-01 ENCOUNTER — Telehealth: Payer: Self-pay

## 2021-10-01 NOTE — Telephone Encounter (Signed)
  Follow up Call-     09/30/2021    7:51 AM 05/21/2020    7:10 AM  Call back number  Post procedure Call Back phone  # [phone] cell [phone]  Permission to leave phone message Yes Yes     Patient questions:  Do you have a fever, pain , or abdominal swelling? No. Pain Score  0 *  Have you tolerated food without any problems? Yes.    Have you been able to return to your normal activities? Yes.    Do you have any questions about your discharge instructions: Diet   No. Medications  No. Follow up visit  No.  Do you have questions or concerns about your Care? No.  Actions: * If pain score is 4 or above: No action needed, pain <4.

## 2021-10-06 ENCOUNTER — Encounter: Payer: Self-pay | Admitting: Hematology and Oncology

## 2021-10-07 ENCOUNTER — Encounter: Payer: Self-pay | Admitting: Family Medicine

## 2021-10-08 ENCOUNTER — Encounter: Payer: Self-pay | Admitting: Family Medicine

## 2021-10-08 NOTE — Progress Notes (Unsigned)
    Charl Wellen T. Hillman Attig, MD, [place] at [facility] [place], [address]  Phone: [phone]  FAX: [place] - 66 y.o. male  MRN [id number]  Date of Birth: [birth year]  Date: 10/09/2021  PCP: Owens Loffler, MD  Referral: Owens Loffler, MD  No chief complaint on file.  Subjective:   Charles [name] is a 66 y.o. very pleasant male patient with There is no height or weight on file to calculate BMI. who presents with the following:  Charles [name] presents with some ongoing GAD and lymphoma diagnosis.     Review of Systems is noted in the HPI, as appropriate  Objective:   There were no vitals taken for this visit.  GEN: No acute distress; alert,appropriate. PULM: Breathing comfortably in no respiratory distress PSYCH: Normally interactive.   Laboratory and Imaging Data:  Assessment and Plan:   ***

## 2021-10-09 ENCOUNTER — Encounter: Payer: Self-pay | Admitting: Family Medicine

## 2021-10-09 ENCOUNTER — Ambulatory Visit (INDEPENDENT_AMBULATORY_CARE_PROVIDER_SITE_OTHER): Payer: Medicare HMO | Admitting: Family Medicine

## 2021-10-09 VITALS — BP 130/80 | HR 64 | Temp 97.5°F | Ht 70.0 in | Wt 227.1 lb

## 2021-10-09 DIAGNOSIS — F411 Generalized anxiety disorder: Secondary | ICD-10-CM

## 2021-10-09 DIAGNOSIS — R69 Illness, unspecified: Secondary | ICD-10-CM | POA: Diagnosis not present

## 2021-10-09 MED ORDER — LORAZEPAM 0.5 MG PO TABS: 0.5000 mg | ORAL_TABLET | Freq: Three times a day (TID) | ORAL | 1 refills | Status: DC | PRN

## 2021-10-09 MED ORDER — FLUOXETINE HCL 20 MG PO CAPS: 20.0000 mg | ORAL_CAPSULE | Freq: Every day | ORAL | 3 refills | Status: DC

## 2021-10-09 NOTE — Patient Instructions (Signed)
Start fluoxetine 20 mg  For the first week, take every other day  Then increase to 20 mg each day

## 2021-10-14 ENCOUNTER — Encounter: Payer: Self-pay | Admitting: Family Medicine

## 2021-10-16 ENCOUNTER — Other Ambulatory Visit: Payer: Medicare HMO

## 2021-10-16 DIAGNOSIS — C61 Malignant neoplasm of prostate: Secondary | ICD-10-CM | POA: Diagnosis not present

## 2021-10-18 LAB — PSA: Prostate Specific Ag, Serum: 1.7 ng/mL (ref 0.0–4.0)

## 2021-10-20 ENCOUNTER — Ambulatory Visit: Payer: Medicare HMO | Admitting: Neurology

## 2021-10-20 ENCOUNTER — Encounter: Payer: Self-pay | Admitting: Hematology and Oncology

## 2021-10-20 ENCOUNTER — Telehealth: Payer: Self-pay | Admitting: *Deleted

## 2021-10-20 ENCOUNTER — Inpatient Hospital Stay: Payer: Medicare HMO | Attending: Hematology and Oncology | Admitting: Hematology and Oncology

## 2021-10-20 ENCOUNTER — Inpatient Hospital Stay: Payer: Medicare HMO

## 2021-10-20 VITALS — BP 163/86 | HR 75 | Temp 97.8°F | Resp 18 | Ht 70.0 in | Wt 226.8 lb

## 2021-10-20 DIAGNOSIS — I700 Atherosclerosis of aorta: Secondary | ICD-10-CM | POA: Diagnosis not present

## 2021-10-20 DIAGNOSIS — D801 Nonfamilial hypogammaglobulinemia: Secondary | ICD-10-CM

## 2021-10-20 DIAGNOSIS — Z8619 Personal history of other infectious and parasitic diseases: Secondary | ICD-10-CM | POA: Diagnosis not present

## 2021-10-20 DIAGNOSIS — R062 Wheezing: Secondary | ICD-10-CM | POA: Diagnosis not present

## 2021-10-20 DIAGNOSIS — C61 Malignant neoplasm of prostate: Secondary | ICD-10-CM | POA: Diagnosis not present

## 2021-10-20 DIAGNOSIS — C8580 Other specified types of non-Hodgkin lymphoma, unspecified site: Secondary | ICD-10-CM

## 2021-10-20 DIAGNOSIS — R030 Elevated blood-pressure reading, without diagnosis of hypertension: Secondary | ICD-10-CM | POA: Diagnosis not present

## 2021-10-20 DIAGNOSIS — C884 Extranodal marginal zone B-cell lymphoma of mucosa-associated lymphoid tissue [MALT-lymphoma]: Secondary | ICD-10-CM | POA: Diagnosis not present

## 2021-10-20 LAB — CBC WITH DIFFERENTIAL (CANCER CENTER ONLY)
Abs Immature Granulocytes: 0.04 10*3/uL (ref 0.00–0.07)
Basophils Absolute: 0.1 10*3/uL (ref 0.0–0.1)
Basophils Relative: 1 %
Eosinophils Absolute: 0.6 10*3/uL — ABNORMAL HIGH (ref 0.0–0.5)
Eosinophils Relative: 10 %
HCT: 42.5 % (ref 39.0–52.0)
Hemoglobin: 15.2 g/dL (ref 13.0–17.0)
Immature Granulocytes: 1 %
Lymphocytes Relative: 21 %
Lymphs Abs: 1.3 10*3/uL (ref 0.7–4.0)
MCH: 31.7 pg (ref 26.0–34.0)
MCHC: 35.8 g/dL (ref 30.0–36.0)
MCV: 88.7 fL (ref 80.0–100.0)
Monocytes Absolute: 0.5 10*3/uL (ref 0.1–1.0)
Monocytes Relative: 8 %
Neutro Abs: 3.7 10*3/uL (ref 1.7–7.7)
Neutrophils Relative %: 59 %
Platelet Count: 210 10*3/uL (ref 150–400)
RBC: 4.79 MIL/uL (ref 4.22–5.81)
RDW: 12 % (ref 11.5–15.5)
WBC Count: 6.1 10*3/uL (ref 4.0–10.5)
nRBC: 0 % (ref 0.0–0.2)

## 2021-10-20 LAB — CMP (CANCER CENTER ONLY)
ALT: 37 U/L (ref 0–44)
AST: 20 U/L (ref 15–41)
Albumin: 4.1 g/dL (ref 3.5–5.0)
Alkaline Phosphatase: 91 U/L (ref 38–126)
Anion gap: 5 (ref 5–15)
BUN: 17 mg/dL (ref 8–23)
CO2: 28 mmol/L (ref 22–32)
Calcium: 9 mg/dL (ref 8.9–10.3)
Chloride: 104 mmol/L (ref 98–111)
Creatinine: 0.73 mg/dL (ref 0.61–1.24)
GFR, Estimated: >60 mL/min (ref >60)
Glucose, Bld: 183 mg/dL — ABNORMAL HIGH (ref 70–99)
Potassium: 4.1 mmol/L (ref 3.5–5.1)
Sodium: 137 mmol/L (ref 135–145)
Total Bilirubin: 0.6 mg/dL (ref 0.3–1.2)
Total Protein: 7.2 g/dL (ref 6.5–8.1)

## 2021-10-20 LAB — HEPATITIS B CORE ANTIBODY, IGM: Hep B C IgM: NONREACTIVE

## 2021-10-20 LAB — LACTATE DEHYDROGENASE: LDH: 171 U/L (ref 98–192)

## 2021-10-20 LAB — URIC ACID: Uric Acid, Serum: 4.1 mg/dL (ref 3.7–8.6)

## 2021-10-20 LAB — HEPATITIS B SURFACE ANTIBODY,QUALITATIVE: Hep B S Ab: NONREACTIVE

## 2021-10-20 LAB — HEPATITIS B SURFACE ANTIGEN: Hepatitis B Surface Ag: NONREACTIVE

## 2021-10-20 NOTE — Assessment & Plan Note (Signed)
Likely due to anxiety Observe closely for now

## 2021-10-20 NOTE — Progress Notes (Signed)
[facility] OFFICE PROGRESS NOTE  Patient Care Team: Owens Loffler, MD as PCP - General (Family Medicine)  ASSESSMENT & PLAN:  Marginal zone B-cell lymphoma (Charles [name]) I reviewed the clinical findings and pathology report with the patient and family I recommend repeat PET/CT imaging for staging Given his symptoms of frequent bowel movement, even though that has been going on for many years, I think it is reasonable could consider treatment Assuming PET/CT imaging confirm localized disease with no evidence of involvement elsewhere, I think single agent rituximab would be a reasonable clinical approach The patient would like to get second opinion at [facility] I will facilitate referral I recommend blood work and PET/CT imaging as soon as possible I will see him back next week for further follow-up and review of test results  Expiratory wheezing He has bilateral expiratory wheezes It is not clear whether this is from allergies He has appointment to see ENT I will order some blood work and PET/CT imaging as above If there are signs of pneumonia, I will call him and we will start him on antibiotics  History of shingles He had recent shingles outbreak His skin is almost completely healed He is relatively asymptomatic There is no role for antiviral treatment right now I will order immunoglobulin levels in case he have acquired hypogammaglobulinemia from his disease  Elevated BP without diagnosis of hypertension Likely due to anxiety Observe closely for now  Orders Placed This Encounter  Procedures   NM PET Image Restage (PS) Skull Base to Thigh (F-18 FDG)    Standing Status:   Future    Standing Expiration Date:   10/21/2022    Order Specific Question:   If indicated for the ordered procedure, I authorize the administration of a radiopharmaceutical per Radiology protocol    Answer:   Yes    Order Specific Question:   Preferred imaging location?    Answer:   [facility]    Order Specific Question:   Radiology Contrast Protocol - do NOT remove file path    Answer:   [email or web address]   CBC with Differential (Cancer Center Only)    Standing Status:   Future    Number of Occurrences:   1    Standing Expiration Date:   10/21/2022   CMP ([facility] only)    Standing Status:   Future    Number of Occurrences:   1    Standing Expiration Date:   10/21/2022   Uric acid    Standing Status:   Future    Number of Occurrences:   1    Standing Expiration Date:   10/21/2022   Lactate dehydrogenase    Standing Status:   Future    Number of Occurrences:   1    Standing Expiration Date:   10/21/2022   Hepatitis B core antibody, IgM    Standing Status:   Future    Number of Occurrences:   1    Standing Expiration Date:   10/21/2022   Hepatitis B surface antibody,qualitative    Standing Status:   Future    Number of Occurrences:   1    Standing Expiration Date:   10/21/2022   Hepatitis B surface antigen    Standing Status:   Future    Number of Occurrences:   1    Standing Expiration Date:   10/21/2022   IgG, IgA, IgM    Standing Status:   Future    Number of  Occurrences:   1    Standing Expiration Date:   10/21/2022    All questions were answered. The patient knows to call the clinic with any problems, questions or concerns. The total time spent in the appointment was 40 minutes encounter with patients including review of chart and various tests results, discussions about plan of care and coordination of care plan   [name], MD 10/20/2021 9:49 AM  INTERVAL HISTORY: Please see below for problem oriented charting. he returns for review of test result He underwent repeat colonoscopy evaluation.  Biopsy showed evidence of disease He is not symptomatic although on further questioning, he has frequent bowel movement 5-6 times a day that has been going on for many years He had recent shingles outbreak and  expiratory wheezes No new lymphadenopathy Denies recent fever or chills  REVIEW OF SYSTEMS:   Constitutional: Denies fevers, chills or abnormal weight loss Eyes: Denies blurriness of vision Ears, nose, mouth, throat, and face: Denies mucositis or sore throat Cardiovascular: Denies palpitation, chest discomfort or lower extremity swelling Gastrointestinal:  Denies nausea, heartburn or change in bowel habits Lymphatics: Denies new lymphadenopathy or easy bruising Neurological:Denies numbness, tingling or new weaknesses Behavioral/Psych: Mood is stable, no new changes  All other systems were reviewed with the patient and are negative.  I have reviewed the past medical history, past surgical history, social history and family history with the patient and they are unchanged from previous note.  ALLERGIES:  has No Known Allergies.  MEDICATIONS:  Current Outpatient Medications  Medication Sig Dispense Refill   Alcohol Swabs (B-D SINGLE USE SWABS REGULAR) PADS Use to check blood sugar up to 2 times a day 100 each 5   aspirin EC 81 MG tablet Take 81 mg by mouth daily.     atorvastatin (LIPITOR) 40 MG tablet TAKE 1 TABLET BY MOUTH EVERY DAY 90 tablet 3   Cetirizine HCl 10 MG CAPS      CLINPRO 5000 1.1 % PSTE Place onto teeth daily.     Flaxseed, Linseed, (FLAX SEED OIL PO) Take by mouth.     FLUoxetine (PROZAC) 20 MG capsule Take 1 capsule (20 mg total) by mouth daily. 30 capsule 3   glucose blood (ONETOUCH VERIO) test strip Use to check blood sugar up to 2 times a day. 100 each 5   Lancets (ONETOUCH DELICA PLUS [id number]) MISC Use to check blood sugar up to 2 times a day. 100 each 5   LORazepam (ATIVAN) 0.5 MG tablet Take 1 tablet (0.5 mg total) by mouth every 8 (eight) hours as needed. for anxiety 30 tablet 1   meloxicam (MOBIC) 15 MG tablet TAKE 1 TABLET (15 MG TOTAL) BY MOUTH DAILY. 30 tablet 5   Multiple Vitamins-Minerals (MULTIVITAMIN WITH MINERALS) tablet Take 1 tablet by mouth daily.      Pseudoephedrine HCl (SUDAFED PO) Take 1 tablet by mouth daily as needed (allergies).      sildenafil (VIAGRA) 100 MG tablet Take 0.5-1 tablets (50-100 mg total) by mouth daily as needed for erectile dysfunction. 5 tablet 11   No current facility-administered medications for this visit.    SUMMARY OF ONCOLOGIC HISTORY: Oncology History  Marginal zone B-cell lymphoma ([place])  05/21/2020 Procedure   Colonoscopy by Dr. Hilarie Fredrickson - One 4 mm polyp in the ascending colon, removed with a cold snare. Resected and retrieved. - One 25 mm polyp in the proximal transverse colon, removed piecemeal using a cold snare. Resected and retrieved. Tattooed. - Diverticulosis from cecum  to sigmoid colon. - The distal rectum and anal verge are normal on retroflexion view.   05/21/2020 Pathology Results   1. Surgical [P], colon, ascending, polyp (1) - TUBULAR ADENOMA. - NO HIGH GRADE DYSPLASIA OR CARCINOMA. 2. Surgical [P], colon, transverse, polyp (1) - ATYPICAL LYMPHOID PROLIFERATION. - SEE MICROSCOPIC DESCRIPTION. Microscopic Comment 2. The sections show a dense, diffuse and relatively monomorphic lymphoid infiltrate involving the lamina propria and submucosa and mostly characterized by small to medium sized lymphocytes with round to slightly irregular nuclei and moderately abundant clear cytoplasm imparting a monocytoid appearance. The infiltrate displays scattered small foci of lymphoepithelial lesions and occasionally surround very small "naked" germinal centers. A battery of immunohistochemical stains was performed and shows that the lymphoid infiltrate is predominantly composed of B cells as seen with CD20 associated with diffuse positivity for Bcl-2 and CD43. No significant CD10 or cyclin D1 positivity is identified. BCL6 highlights scattering of small positive clusters correlating with previously described germinal centers. Ki-67 shows very low expression (less than 10%). In situ hybridization for kappa and  lambda highlight a polyclonal superficial plasma cell component. The findings are highly suspicious for involvement by low-grade B-cell lymphoma particularly extranodal marginal zone lymphoma. FISH and gene rearrangement study studies will be performed and the results reported in an addendum. Dr. Gari Crown reviewed this part and cocnurs. Claudette Laws MD Pathologist, Electronic Signature (Case signed 05/27/2020)    06/24/2020 PET scan   No signs of nodal disease in the neck, chest, abdomen or pelvis.   Focal area of uptake in the LEFT hemi prostate, nonspecific but raising the question of prostate neoplasm. Urologic consultation is suggested for further evaluation.   Aortic atherosclerosis and coronary artery disease.   Maxillary sinus disease.   Aortic Atherosclerosis (ICD10-I70.0).   06/24/2020 Cancer Staging   Staging form: Hodgkin and Non-Hodgkin Lymphoma, AJCC 8th Edition - Clinical stage from 06/24/2020: Stage IE (Marginal zone lymphoma) - Signed by [name], MD on 06/24/2020 Stage prefix: Initial diagnosis   09/30/2021 Procedure   - The examined portion of the ileum was normal. - One 1 mm polyp in the cecum, removed with a cold biopsy forceps. Resected and retrieved. - Post-polypectomy scar with distal tattoo in the proximal transverse colon. No evidence of residual polyp/tumor. Biopsied. - One 4 mm polyp in the transverse colon, removed with a cold snare. Complete resection. Polyp tissue not retrieved. - Moderate diverticulosis from ascending colon to sigmoid colon. - The distal rectum and anal verge are normal on retroflexion view.   09/30/2021 Pathology Results   Diagnosis 1. Surgical [P], colon, cecum, polyp (1) - POLYPOID FRAGMENT OF BENIGN COLONIC MUCOSA WITH NO SIGNIFICANT PATHOLOGIC CHANGES 2. Surgical [P], proximal transverse colon scar - ATYPICAL LYMPHOID INFILTRATE (SEE NOTE) Diagnosis Note 2. - Morphologic evaluation reveals colonic mucosa with a single prominent  lymphoid aggregate, and no lymphoepithelial lesions. The cells are positive for CD20, CD43 and negative for CD3. Definitive clonality cannot be established given the lack of kappa and lambda ISH staining. Normal colonic mucosa is seen elsewhere. The findings are not diagnostic of, however in the given clinical context of the patient's history of extranodal MALT lymphoma, are suspicious for involvement. Dr. Arby Barrette has reviewed the case and agrees with the interpretation. Clinical and radiologic correlation is recommended.   Prostate cancer ([place])  04/02/2021 Initial Diagnosis   Prostate cancer ([place])   06/24/2021 Cancer Staging   Staging form: Prostate, AJCC 8th Edition - Clinical stage from 06/24/2021: cT1, cN0, cM0, Grade Group:  1 - Signed by [name], MD on 06/24/2021 Stage prefix: Initial diagnosis Histologic grading system: 5 grade system     PHYSICAL EXAMINATION: ECOG PERFORMANCE STATUS: 1 - Symptomatic but completely ambulatory  Vitals:   10/20/21 0836  BP: (!) 163/86  Pulse: 75  Resp: 18  Temp: 97.8 F (36.6 C)  SpO2: 100%   Filed Weights   10/20/21 0836  Weight: 226 lb 12.8 oz (102.9 kg)    GENERAL:alert, no distress and comfortable SKIN: He had recent shingles outbreak on the right thoracic T4/T5 distribution.  It is almost completely healed EYES: normal, Conjunctiva are pink and non-injected, sclera clear OROPHARYNX:no exudate, no erythema and lips, buccal mucosa, and tongue normal  NECK: supple, thyroid normal size, non-tender, without nodularity LYMPH:  no palpable lymphadenopathy in the cervical, axillary or inguinal LUNGS: clear to auscultation and percussion with normal breathing effort HEART: regular rate & rhythm and no murmurs and no lower extremity edema ABDOMEN:abdomen soft, non-tender and normal bowel sounds Musculoskeletal:no cyanosis of digits and no clubbing  NEURO: alert & oriented x 3 with fluent speech, no focal motor/sensory deficits  LABORATORY  DATA:  I have reviewed the data as listed    Component Value Date/Time   NA 137 06/24/2021 1038   NA 139 01/11/2020 1249   K 4.5 06/24/2021 1038   CL 105 06/24/2021 1038   CO2 27 06/24/2021 1038   GLUCOSE 173 (H) 06/24/2021 1038   BUN 23 06/24/2021 1038   BUN 14 01/11/2020 1249   CREATININE 0.83 06/24/2021 1038   CALCIUM 9.7 06/24/2021 1038   PROT 7.4 06/24/2021 1038   ALBUMIN 4.2 06/24/2021 1038   AST 29 06/24/2021 1038   ALT 58 (H) 06/24/2021 1038   ALKPHOS 84 06/24/2021 1038   BILITOT 0.8 06/24/2021 1038   GFRNONAA >60 06/24/2021 1038   GFRAA 91 01/11/2020 1249    No results found for: "SPEP", "UPEP"  Lab Results  Component Value Date   WBC 6.1 10/20/2021   NEUTROABS 3.7 10/20/2021   HGB 15.2 10/20/2021   HCT 42.5 10/20/2021   MCV 88.7 10/20/2021   PLT 210 10/20/2021      Chemistry      Component Value Date/Time   NA 137 06/24/2021 1038   NA 139 01/11/2020 1249   K 4.5 06/24/2021 1038   CL 105 06/24/2021 1038   CO2 27 06/24/2021 1038   BUN 23 06/24/2021 1038   BUN 14 01/11/2020 1249   CREATININE 0.83 06/24/2021 1038      Component Value Date/Time   CALCIUM 9.7 06/24/2021 1038   ALKPHOS 84 06/24/2021 1038   AST 29 06/24/2021 1038   ALT 58 (H) 06/24/2021 1038   BILITOT 0.8 06/24/2021 1038

## 2021-10-20 NOTE — Assessment & Plan Note (Signed)
He had recent shingles outbreak His skin is almost completely healed He is relatively asymptomatic There is no role for antiviral treatment right now I will order immunoglobulin levels in case he have acquired hypogammaglobulinemia from his disease

## 2021-10-20 NOTE — Assessment & Plan Note (Signed)
I reviewed the clinical findings and pathology report with the patient and family I recommend repeat PET/CT imaging for staging Given his symptoms of frequent bowel movement, even though that has been going on for many years, I think it is reasonable could consider treatment Assuming PET/CT imaging confirm localized disease with no evidence of involvement elsewhere, I think single agent rituximab would be a reasonable clinical approach The patient would like to get second opinion at [facility] I will facilitate referral I recommend blood work and PET/CT imaging as soon as possible I will see him back next week for further follow-up and review of test results

## 2021-10-20 NOTE — Assessment & Plan Note (Signed)
He has bilateral expiratory wheezes It is not clear whether this is from allergies He has appointment to see ENT I will order some blood work and PET/CT imaging as above If there are signs of pneumonia, I will call him and we will start him on antibiotics

## 2021-10-20 NOTE — Telephone Encounter (Signed)
Faxed referral at patient's request, for 2nd opinion to Dr. Dorothea Ogle with [facility]  Fax confirmation received

## 2021-10-21 ENCOUNTER — Telehealth: Payer: Self-pay

## 2021-10-21 DIAGNOSIS — R930 Abnormal findings on diagnostic imaging of skull and head, not elsewhere classified: Secondary | ICD-10-CM | POA: Diagnosis not present

## 2021-10-21 DIAGNOSIS — J342 Deviated nasal septum: Secondary | ICD-10-CM | POA: Diagnosis not present

## 2021-10-21 LAB — IGG, IGA, IGM
IgA: 285 mg/dL (ref 61–437)
IgG (Immunoglobin G), Serum: 1260 mg/dL (ref 603–1613)
IgM (Immunoglobulin M), Srm: 116 mg/dL (ref 20–172)

## 2021-10-21 NOTE — Telephone Encounter (Signed)
-----   Message from Billey Co, MD sent at 10/21/2021  8:10 AM EDT ----- Kermit Balo news, PSA stable, RTC 1 year PSA prior  Nickolas Madrid, MD 10/21/2021

## 2021-10-21 NOTE — Telephone Encounter (Signed)
Patient returned the call.  PSA results provided and follow appts made.

## 2021-10-21 NOTE — Telephone Encounter (Signed)
I left a message for the patient to return my call.

## 2021-10-23 ENCOUNTER — Encounter: Payer: Self-pay | Admitting: Family Medicine

## 2021-10-24 NOTE — Progress Notes (Signed)
Faxed requested pathology reports to [facility] to [facility] for referral to [phone], received fax confirmation.

## 2021-10-29 ENCOUNTER — Ambulatory Visit (HOSPITAL_COMMUNITY)
Admission: RE | Admit: 2021-10-29 | Discharge: 2021-10-29 | Disposition: A | Discharge status: Home / Self Care | Payer: Medicare HMO | Source: Ambulatory Visit | Attending: Hematology and Oncology | Admitting: Hematology and Oncology

## 2021-10-29 DIAGNOSIS — C8580 Other specified types of non-Hodgkin lymphoma, unspecified site: Secondary | ICD-10-CM | POA: Diagnosis not present

## 2021-10-29 DIAGNOSIS — C884 Extranodal marginal zone B-cell lymphoma of mucosa-associated lymphoid tissue [MALT-lymphoma]: Secondary | ICD-10-CM | POA: Diagnosis not present

## 2021-10-29 LAB — GLUCOSE, CAPILLARY: Glucose-Capillary: 132 mg/dL — ABNORMAL HIGH (ref 70–99)

## 2021-10-29 MED ORDER — FLUDEOXYGLUCOSE F - 18 (FDG) INJECTION
11.3000 | Freq: Once | INTRAVENOUS | Status: AC | PRN
  Administered 2021-10-29: 11.3 via INTRAVENOUS

## 2021-10-31 ENCOUNTER — Encounter: Payer: Self-pay | Admitting: Hematology and Oncology

## 2021-10-31 ENCOUNTER — Inpatient Hospital Stay (HOSPITAL_BASED_OUTPATIENT_CLINIC_OR_DEPARTMENT_OTHER): Payer: Medicare HMO | Admitting: Hematology and Oncology

## 2021-10-31 VITALS — BP 140/77 | HR 74 | Resp 18 | Ht 70.0 in | Wt 224.0 lb

## 2021-10-31 DIAGNOSIS — C61 Malignant neoplasm of prostate: Secondary | ICD-10-CM | POA: Diagnosis not present

## 2021-10-31 DIAGNOSIS — R062 Wheezing: Secondary | ICD-10-CM | POA: Diagnosis not present

## 2021-10-31 DIAGNOSIS — C8580 Other specified types of non-Hodgkin lymphoma, unspecified site: Secondary | ICD-10-CM | POA: Diagnosis not present

## 2021-10-31 DIAGNOSIS — I700 Atherosclerosis of aorta: Secondary | ICD-10-CM | POA: Diagnosis not present

## 2021-10-31 DIAGNOSIS — R030 Elevated blood-pressure reading, without diagnosis of hypertension: Secondary | ICD-10-CM | POA: Diagnosis not present

## 2021-10-31 DIAGNOSIS — C884 Extranodal marginal zone B-cell lymphoma of mucosa-associated lymphoid tissue [MALT-lymphoma]: Secondary | ICD-10-CM | POA: Diagnosis not present

## 2021-10-31 DIAGNOSIS — Z8619 Personal history of other infectious and parasitic diseases: Secondary | ICD-10-CM | POA: Diagnosis not present

## 2021-10-31 NOTE — Progress Notes (Signed)
[name] OFFICE PROGRESS NOTE  Patient Care Team: Owens Loffler, MD as PCP - General (Family Medicine)  ASSESSMENT & PLAN:  Marginal zone B-cell lymphoma ([place]) I have reviewed multiple PET CT imaging with the patient and his wife as well as correlating changes seen on PET CT imaging with his colonoscopy report The abnormal changes noted in the sigmoid colon was without any abnormalities on colonoscopy However, involvement of the wall of the colon cannot be excluded The patient had frequent small bowel movement We discussed treatment option, risk, benefits, side effects of single agent rituximab weekly x4 The patient has appointment at [facility] on November 9 for second opinion He will call me if he is interested to start treatment  Expiratory wheezing This is improved.  His lung windows on PET/CT imaging is normal Observe closely for now  No orders of the defined types were placed in this encounter.   All questions were answered. The patient knows to call the clinic with any problems, questions or concerns. The total time spent in the appointment was 30 minutes encounter with patients including review of chart and various tests results, discussions about plan of care and coordination of care plan   Charles Lark, MD 10/31/2021 4:09 PM  INTERVAL HISTORY: Please see below for problem oriented charting. he returns for review of test results He felt better since last visit His breathing has improved and wheezing has resolved  REVIEW OF SYSTEMS:   Constitutional: Denies fevers, chills or abnormal weight loss Eyes: Denies blurriness of vision Ears, nose, mouth, throat, and face: Denies mucositis or sore throat Cardiovascular: Denies palpitation, chest discomfort or lower extremity swelling Gastrointestinal:  Denies nausea, heartburn or change in bowel habits Skin: Denies abnormal skin rashes Lymphatics: Denies new lymphadenopathy or easy bruising Neurological:Denies  numbness, tingling or new weaknesses Behavioral/Psych: Mood is stable, no new changes  All other systems were reviewed with the patient and are negative.  I have reviewed the past medical history, past surgical history, social history and family history with the patient and they are unchanged from previous note.  ALLERGIES:  has No Known Allergies.  MEDICATIONS:  Current Outpatient Medications  Medication Sig Dispense Refill   Alcohol Swabs (B-D SINGLE USE SWABS REGULAR) PADS Use to check blood sugar up to 2 times a day 100 each 5   aspirin EC 81 MG tablet Take 81 mg by mouth daily.     atorvastatin (LIPITOR) 40 MG tablet TAKE 1 TABLET BY MOUTH EVERY DAY 90 tablet 3   Cetirizine HCl 10 MG CAPS      CLINPRO 5000 1.1 % PSTE Place onto teeth daily.     Flaxseed, Linseed, (FLAX SEED OIL PO) Take by mouth.     FLUoxetine (PROZAC) 20 MG capsule Take 1 capsule (20 mg total) by mouth daily. 30 capsule 3   glucose blood (ONETOUCH VERIO) test strip Use to check blood sugar up to 2 times a day. 100 each 5   Lancets (ONETOUCH DELICA PLUS [id number]) MISC Use to check blood sugar up to 2 times a day. 100 each 5   LORazepam (ATIVAN) 0.5 MG tablet Take 1 tablet (0.5 mg total) by mouth every 8 (eight) hours as needed. for anxiety 30 tablet 1   meloxicam (MOBIC) 15 MG tablet TAKE 1 TABLET (15 MG TOTAL) BY MOUTH DAILY. 30 tablet 5   Multiple Vitamins-Minerals (MULTIVITAMIN WITH MINERALS) tablet Take 1 tablet by mouth daily.     Pseudoephedrine HCl (SUDAFED PO) Take  1 tablet by mouth daily as needed (allergies).      sildenafil (VIAGRA) 100 MG tablet Take 0.5-1 tablets (50-100 mg total) by mouth daily as needed for erectile dysfunction. 5 tablet 11   No current facility-administered medications for this visit.    SUMMARY OF ONCOLOGIC HISTORY: Oncology History  Marginal zone B-cell lymphoma ([place])  05/21/2020 Procedure   Colonoscopy by Dr. Hilarie Fredrickson - One 4 mm polyp in the ascending colon, removed with a  cold snare. Resected and retrieved. - One 25 mm polyp in the proximal transverse colon, removed piecemeal using a cold snare. Resected and retrieved. Tattooed. - Diverticulosis from cecum to sigmoid colon. - The distal rectum and anal verge are normal on retroflexion view.   05/21/2020 Pathology Results   1. Surgical [P], colon, ascending, polyp (1) - TUBULAR ADENOMA. - NO HIGH GRADE DYSPLASIA OR CARCINOMA. 2. Surgical [P], colon, transverse, polyp (1) - ATYPICAL LYMPHOID PROLIFERATION. - SEE MICROSCOPIC DESCRIPTION. Microscopic Comment 2. The sections show a dense, diffuse and relatively monomorphic lymphoid infiltrate involving the lamina propria and submucosa and mostly characterized by small to medium sized lymphocytes with round to slightly irregular nuclei and moderately abundant clear cytoplasm imparting a monocytoid appearance. The infiltrate displays scattered small foci of lymphoepithelial lesions and occasionally surround very small "naked" germinal centers. A battery of immunohistochemical stains was performed and shows that the lymphoid infiltrate is predominantly composed of B cells as seen with CD20 associated with diffuse positivity for Bcl-2 and CD43. No significant CD10 or cyclin D1 positivity is identified. BCL6 highlights scattering of small positive clusters correlating with previously described germinal centers. Ki-67 shows very low expression (less than 10%). In situ hybridization for kappa and lambda highlight a polyclonal superficial plasma cell component. The findings are highly suspicious for involvement by low-grade B-cell lymphoma particularly extranodal marginal zone lymphoma. FISH and gene rearrangement study studies will be performed and the results reported in an addendum. Dr. Gari Crown reviewed this part and cocnurs. Claudette Laws MD Pathologist, Electronic Signature (Case signed 05/27/2020)    06/24/2020 PET scan   No signs of nodal disease in the neck, chest, abdomen  or pelvis.   Focal area of uptake in the LEFT hemi prostate, nonspecific but raising the question of prostate neoplasm. Urologic consultation is suggested for further evaluation.   Aortic atherosclerosis and coronary artery disease.   Maxillary sinus disease.   Aortic Atherosclerosis (ICD10-I70.0).   06/24/2020 Cancer Staging   Staging form: Hodgkin and Non-Hodgkin Lymphoma, AJCC 8th Edition - Clinical stage from 06/24/2020: Stage IE (Marginal zone lymphoma) - Signed by Charles Lark, MD on 06/24/2020 Stage prefix: Initial diagnosis   09/30/2021 Procedure   - The examined portion of the ileum was normal. - One 1 mm polyp in the cecum, removed with a cold biopsy forceps. Resected and retrieved. - Post-polypectomy scar with distal tattoo in the proximal transverse colon. No evidence of residual polyp/tumor. Biopsied. - One 4 mm polyp in the transverse colon, removed with a cold snare. Complete resection. Polyp tissue not retrieved. - Moderate diverticulosis from ascending colon to sigmoid colon. - The distal rectum and anal verge are normal on retroflexion view.   09/30/2021 Pathology Results   Diagnosis 1. Surgical [P], colon, cecum, polyp (1) - POLYPOID FRAGMENT OF BENIGN COLONIC MUCOSA WITH NO SIGNIFICANT PATHOLOGIC CHANGES 2. Surgical [P], proximal transverse colon scar - ATYPICAL LYMPHOID INFILTRATE (SEE NOTE) Diagnosis Note 2. - Morphologic evaluation reveals colonic mucosa with a single prominent lymphoid aggregate, and no lymphoepithelial lesions.  The cells are positive for CD20, CD43 and negative for CD3. Definitive clonality cannot be established given the lack of kappa and lambda ISH staining. Normal colonic mucosa is seen elsewhere. The findings are not diagnostic of, however in the given clinical context of the patient's history of extranodal MALT lymphoma, are suspicious for involvement. Dr. Arby Barrette has reviewed the case and agrees with the interpretation. Clinical and radiologic  correlation is recommended.   10/31/2021 PET scan   1. No hypermetabolic adenopathy above or below the diaphragm. 2. No splenomegaly or evidence of osseous lymphomatous involvement 3. Increased short segment uptake in the sigmoid colon along an area of diverticulosis with some associated wall thickening but no adjacent inflammation. While this may reflect physiologic activity versus Segmental colitis associated with diverticulosis, an underlying colonic mass is a differential consideration not excluded on this study, recommend correlation with history of colon cancer screening and colonoscopy if clinically indicated. 4.  Aortic Atherosclerosis (ICD10-I70.0).   Prostate cancer ([place])  04/02/2021 Initial Diagnosis   Prostate cancer ([place])   06/24/2021 Cancer Staging   Staging form: Prostate, AJCC 8th Edition - Clinical stage from 06/24/2021: cT1, cN0, cM0, Grade Group: 1 - Signed by Charles Lark, MD on 06/24/2021 Stage prefix: Initial diagnosis Histologic grading system: 5 grade system     PHYSICAL EXAMINATION: ECOG PERFORMANCE STATUS: 1 - Symptomatic but completely ambulatory  Vitals:   10/31/21 1446  BP: (!) 140/77  Pulse: 74  Resp: 18  SpO2: 100%   Filed Weights   10/31/21 1446  Weight: 224 lb (101.6 kg)    GENERAL:alert, no distress and comfortable NEURO: alert & oriented x 3 with fluent speech, no focal motor/sensory deficits  LABORATORY DATA:  I have reviewed the data as listed    Component Value Date/Time   NA 137 10/20/2021 0923   NA 139 01/11/2020 1249   K 4.1 10/20/2021 0923   CL 104 10/20/2021 0923   CO2 28 10/20/2021 0923   GLUCOSE 183 (H) 10/20/2021 0923   BUN 17 10/20/2021 0923   BUN 14 01/11/2020 1249   CREATININE 0.73 10/20/2021 0923   CALCIUM 9.0 10/20/2021 0923   PROT 7.2 10/20/2021 0923   ALBUMIN 4.1 10/20/2021 0923   AST 20 10/20/2021 0923   ALT 37 10/20/2021 0923   ALKPHOS 91 10/20/2021 0923   BILITOT 0.6 10/20/2021 0923   GFRNONAA >60 10/20/2021  0923   GFRAA 91 01/11/2020 1249    No results found for: "SPEP", "UPEP"  Lab Results  Component Value Date   WBC 6.1 10/20/2021   NEUTROABS 3.7 10/20/2021   HGB 15.2 10/20/2021   HCT 42.5 10/20/2021   MCV 88.7 10/20/2021   PLT 210 10/20/2021      Chemistry      Component Value Date/Time   NA 137 10/20/2021 0923   NA 139 01/11/2020 1249   K 4.1 10/20/2021 0923   CL 104 10/20/2021 0923   CO2 28 10/20/2021 0923   BUN 17 10/20/2021 0923   BUN 14 01/11/2020 1249   CREATININE 0.73 10/20/2021 0923      Component Value Date/Time   CALCIUM 9.0 10/20/2021 0923   ALKPHOS 91 10/20/2021 0923   AST 20 10/20/2021 0923   ALT 37 10/20/2021 0923   BILITOT 0.6 10/20/2021 0923       RADIOGRAPHIC STUDIES: I have reviewed multiple imaging studies with the patient I have personally reviewed the radiological images as listed and agreed with the findings in the report. NM PET Image Restage (  PS) Skull Base to Thigh (F-18 FDG)  Result Date: 10/31/2021 CLINICAL DATA:  Subsequent treatment strategy for marginal zone lymphoma. EXAM: NUCLEAR MEDICINE PET SKULL BASE TO THIGH TECHNIQUE: 11.27 mCi F-18 FDG was injected intravenously. Full-ring PET imaging was performed from the skull base to thigh after the radiotracer. CT data was obtained and used for attenuation correction and anatomic localization. Fasting blood glucose: 132 mg/dl COMPARISON:  PET-CT June 24, 2020 and MRI prostate March 31, 2021 FINDINGS: Mediastinal blood pool activity: SUV max 2.03 Liver activity: SUV max 3.95 NECK: No hypermetabolic cervical adenopathy. Incidental CT findings: None. CHEST: No hypermetabolic thoracic adenopathy. No hypermetabolic pulmonary nodules or masses. Incidental CT findings: Aortic atherosclerosis. Coronary artery calcifications. ABDOMEN/PELVIS: No abnormal hypermetabolic activity within the liver, pancreas, adrenal glands, or spleen. No hypermetabolic lymph nodes in the abdomen or pelvis. Increased short  segment uptake in the sigmoid colon along an area of diverticulosis with some associated wall thickening but no adjacent inflammation with a max SUV of 7.0. Nonspecific heterogeneous FDG uptake throughout the prostate gland. Incidental CT findings: Similar postoperative changes in the lower rectum. SKELETON: No focal hypermetabolic activity to suggest skeletal metastasis. Incidental CT findings: None. IMPRESSION: 1. No hypermetabolic adenopathy above or below the diaphragm. 2. No splenomegaly or evidence of osseous lymphomatous involvement 3. Increased short segment uptake in the sigmoid colon along an area of diverticulosis with some associated wall thickening but no adjacent inflammation. While this may reflect physiologic activity versus Segmental colitis associated with diverticulosis, an underlying colonic mass is a differential consideration not excluded on this study, recommend correlation with history of colon cancer screening and colonoscopy if clinically indicated. 4.  Aortic Atherosclerosis (ICD10-I70.0). Electronically Signed   By: Dahlia Bailiff M.D.   On: 10/31/2021 08:46

## 2021-10-31 NOTE — Assessment & Plan Note (Signed)
I have reviewed multiple PET CT imaging with the patient and his wife as well as correlating changes seen on PET CT imaging with his colonoscopy report The abnormal changes noted in the sigmoid colon was without any abnormalities on colonoscopy However, involvement of the wall of the colon cannot be excluded The patient had frequent small bowel movement We discussed treatment option, risk, benefits, side effects of single agent rituximab weekly x4 The patient has appointment at [facility] on November 9 for second opinion He will call me if he is interested to start treatment

## 2021-10-31 NOTE — Assessment & Plan Note (Signed)
This is improved.  His lung windows on PET/CT imaging is normal Observe closely for now

## 2021-11-13 ENCOUNTER — Encounter: Payer: Self-pay | Admitting: Hematology and Oncology

## 2021-11-13 DIAGNOSIS — C8309 Small cell B-cell lymphoma, extranodal and solid organ sites: Secondary | ICD-10-CM | POA: Diagnosis not present

## 2021-11-13 DIAGNOSIS — C884 Extranodal marginal zone B-cell lymphoma of mucosa-associated lymphoid tissue [MALT-lymphoma]: Secondary | ICD-10-CM | POA: Diagnosis not present

## 2021-11-13 DIAGNOSIS — C8599 Non-Hodgkin lymphoma, unspecified, extranodal and solid organ sites: Secondary | ICD-10-CM | POA: Diagnosis not present

## 2021-11-13 DIAGNOSIS — K635 Polyp of colon: Secondary | ICD-10-CM | POA: Diagnosis not present

## 2021-11-13 NOTE — Progress Notes (Signed)
[facility] Clinic Note - Initial Visit   Date: 11/14/2021   Charles [name] MRN: [id number] DOB: April 28, 1955   Dear Dr. Lorelei Pont:  Thank you for your kind referral of Charles [name] for consultation of neuropathy. Although his history is well known to you, please allow [place] to reiterate it for the purpose of our medical record. The patient was accompanied to the clinic by wife who also provides collateral information.     Charles [name] is a 66 y.o. right-handed male with diet controlled diabetes mellitus, anxiety, GERD, prostate cancer, marginal zone B cell lymphoma, and history of alcohol use presenting for evaluation of bilateral feet numbness/tinlging .   IMPRESSION/PLAN: Diabetic neuropathy manifesting with painful paresthesias of the feet and distal weakness.  His neurological examination shows a distal predominant peripheral neuropathy. I had extensive discussion with the patient regarding the pathogenesis, etiology, management, and natural course of neuropathy. His history of alcohol use is most likely the underlying etiology.  Neuropathy tends to be slowly progressive and management is symptomatic.    - Check TSH, vitamin B12, folate, vitamin B1, copper  - Start gabapentin '300mg'$  at bedtime  - NCS/EMG deferred unless symptoms progress  - Patient educated on daily foot inspection, fall prevention, and safety precautions around the home.  Return to clinic in 6 months  ------------------------------------------------------------- History of present illness: Starting about 12 years ago, he began having burning, stinging, and tingling in the feet and distal weakness. Symptoms are constant and worse in the evening when he tries to rest.  Nothing has worsened symptoms.  He has tried a foot massage which provides some relief.  He has noticed that his toes are harder to move, because they feel stuck.  Balance is good, although he had one fall today in the shower  while trying to practice his golf swing.  He did not hurt himself.    He had diabetes for the past 3 years, which is diet controlled.  For about 25 years, until the mid 2000s, he was drinking excessively, sometimes 6 pack of beer nightly.  He has lost his drivers license on several occasions because of alcohol abuse. He is currently sober. No exposure to chemotherapy.   Out-side paper records, electronic medical record, and images have been reviewed where available and summarized as:  Lab Results  Component Value Date   HGBA1C 7.2 (A) 07/14/2021   Lab Results  Component Value Date   FYBOFBPZ02 585 11/14/2021   Lab Results  Component Value Date   TSH 3.34 11/14/2021   No results found for: "ESRSEDRATE", "POCTSEDRATE"  Past Medical History:  Diagnosis Date   Anxiety    Controlled type 2 diabetes mellitus without complication, without long-term current use of insulin ([place])    Diverticulitis    Generalized anxiety disorder 10/21/2014   GERD (gastroesophageal reflux disease)    Lymphoma ([place])    Marginal zone B-cell lymphoma ([place]) 06/10/2020   Prostate cancer ([place]) 04/02/2021   Tubular adenoma of colon     Past Surgical History:  Procedure Laterality Date   ACHILLES TENDON REPAIR  2001   BICEPS TENDON REPAIR Left 2014   COLONOSCOPY  2012   polyps-[place]   HEMORROIDECTOMY  2012   ROTATOR CUFF REPAIR Left 2014   TOTAL HIP ARTHROPLASTY Right 12/07/2014   Procedure: RIGHT TOTAL HIP ARTHROPLASTY ANTERIOR APPROACH;  Surgeon: Mcarthur Rossetti, MD;  Location: [facility];  Service: Orthopedics;  Laterality: Right;     Medications:  Outpatient  Encounter Medications as of 11/14/2021  Medication Sig   Alcohol Swabs (B-D SINGLE USE SWABS REGULAR) PADS Use to check blood sugar up to 2 times a day   aspirin EC 81 MG tablet Take 81 mg by mouth daily.   atorvastatin (LIPITOR) 40 MG tablet TAKE 1 TABLET BY MOUTH EVERY DAY   Cetirizine HCl 10 MG CAPS    CLINPRO 5000 1.1 % PSTE Place onto  teeth daily.   Flaxseed, Linseed, (FLAX SEED OIL PO) Take by mouth.   FLUoxetine (PROZAC) 20 MG capsule Take 1 capsule (20 mg total) by mouth daily.   gabapentin (NEURONTIN) 300 MG capsule Take 1 capsule (300 mg total) by mouth at bedtime.   glucose blood (ONETOUCH VERIO) test strip Use to check blood sugar up to 2 times a day.   Lancets (ONETOUCH DELICA PLUS [id number]) MISC Use to check blood sugar up to 2 times a day.   LORazepam (ATIVAN) 0.5 MG tablet Take 1 tablet (0.5 mg total) by mouth every 8 (eight) hours as needed. for anxiety   meloxicam (MOBIC) 15 MG tablet TAKE 1 TABLET (15 MG TOTAL) BY MOUTH DAILY.   Multiple Vitamins-Minerals (MULTIVITAMIN WITH MINERALS) tablet Take 1 tablet by mouth daily.   Pseudoephedrine HCl (SUDAFED PO) Take 1 tablet by mouth daily as needed (allergies).    sildenafil (VIAGRA) 100 MG tablet Take 0.5-1 tablets (50-100 mg total) by mouth daily as needed for erectile dysfunction.   No facility-administered encounter medications on file as of 11/14/2021.    Allergies: No Known Allergies  Family History: Family History  Problem Relation Age of Onset   Alcohol abuse Father    Heart attack Father    Hyperlipidemia Father    Stroke Father    Hypertension Father    Breast cancer Mother 25   Hypertension Mother    Colon cancer Neg Hx    Colon polyps Neg Hx    Esophageal cancer Neg Hx    Rectal cancer Neg Hx    Stomach cancer Neg Hx     Social History: Social History   Tobacco Use   Smoking status: Former    Packs/day: 0.50    Years: 20.00    Total pack years: 10.00    Types: Cigarettes    Quit date: 01/06/2000    Years since quitting: 21.8   Smokeless tobacco: Former    Types: Chew    Quit date: 01/06/2004  Vaping Use   Vaping Use: Never used  Substance Use Topics   Alcohol use: No    Alcohol/week: 0.0 standard drinks of alcohol    Comment: occ/ heavy drinker in past    Drug use: No   Social History   Social History Narrative   Married,  Arts administrator baseball player   Avid golfer      Are you right handed or left handed? Right Handed    Are you currently employed ? 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 No   Who lives with you? With Wife    What type of home do you live in: 1 story or 2 story? Two story home          Wife is a retired Marine scientist         Vital Signs:  BP (!) 142/86   Pulse 70   Ht '5\' 10"'$  (1.778 m)   Wt 230 lb (104.3  kg)   SpO2 96%   BMI 33.00 kg/m    Neurological Exam: MENTAL STATUS including orientation to time, place, person, recent and remote memory, attention span and concentration, language, and fund of knowledge is normal.  Speech is not dysarthric.  CRANIAL NERVES: II:  No visual field defects.     III-IV-VI: Pupils equal round and reactive to light.  Normal conjugate, extra-ocular eye movements in all directions of gaze.  No nystagmus.  No ptosis.   V:  Normal facial sensation.    VII:  Normal facial symmetry and movements.   VIII:  Normal hearing and vestibular function.   IX-X:  Normal palatal movement.   XI:  Normal shoulder shrug and head rotation.   XII:  Normal tongue strength and range of motion, no deviation or fasciculation.  MOTOR:  No atrophy, fasciculations or abnormal movements.  No pronator drift.   Upper Extremity:  Right  Left  Deltoid  5/5   5/5   Biceps  5/5   5/5   Triceps  5/5   5/5   Wrist extensors  5/5   5/5   Wrist flexors  5/5   5/5   Finger extensors  5/5   5/5   Finger flexors  5/5   5/5   Dorsal interossei  5/5   5/5   Abductor pollicis  5/5   5/5   Tone (Ashworth scale)  0  0   Lower Extremity:  Right  Left  Hip flexors  5/5   5/5   Knee flexors  5/5   5/5   Knee extensors  5/5   5/5   Dorsiflexors  5/5   5/5   Plantarflexors  5/5   5/5   Toe extensors  4/5   4/5   Toe flexors  4/5   4/5   Tone (Ashworth scale)  0  0   MSRs:                                            Right        Left brachioradialis 2+  2+  biceps 2+  2+  triceps 2+  2+  patellar 2+  2+  ankle jerk 2+  2+  Hoffman no  no  plantar response down  down   SENSORY:  Vibration, temperature, and pin prick reduced in the feet bilaterally. Romberg's sign absent.   COORDINATION/GAIT: Normal finger-to- nose-finger.  Intact rapid alternating movements bilaterally.  Gait mildly wide- based and stable. Tandem and stressed gait intact.    Thank you for allowing me to participate in patient's care.  If I can answer any additional questions, I would be pleased to do so.    Sincerely,    Jere Vanburen K. Posey Pronto, DO

## 2021-11-14 ENCOUNTER — Other Ambulatory Visit: Payer: Self-pay | Admitting: Hematology and Oncology

## 2021-11-14 ENCOUNTER — Encounter: Payer: Self-pay | Admitting: Neurology

## 2021-11-14 ENCOUNTER — Encounter: Payer: Self-pay | Admitting: Hematology and Oncology

## 2021-11-14 ENCOUNTER — Other Ambulatory Visit: Payer: Medicare HMO

## 2021-11-14 ENCOUNTER — Ambulatory Visit: Payer: Medicare HMO | Admitting: Neurology

## 2021-11-14 VITALS — BP 142/86 | HR 70 | Ht 70.0 in | Wt 230.0 lb

## 2021-11-14 DIAGNOSIS — R69 Illness, unspecified: Secondary | ICD-10-CM | POA: Diagnosis not present

## 2021-11-14 DIAGNOSIS — G621 Alcoholic polyneuropathy: Secondary | ICD-10-CM

## 2021-11-14 DIAGNOSIS — C8580 Other specified types of non-Hodgkin lymphoma, unspecified site: Secondary | ICD-10-CM

## 2021-11-14 LAB — B12 AND FOLATE PANEL
Folate: >23.8 ng/mL (ref >5.9)
Vitamin B-12: 354 pg/mL (ref 211–911)

## 2021-11-14 LAB — TSH: TSH: 3.34 u[IU]/mL (ref 0.35–5.50)

## 2021-11-14 MED ORDER — GABAPENTIN 300 MG PO CAPS: 300.0000 mg | ORAL_CAPSULE | Freq: Every day | ORAL | 5 refills | Status: DC

## 2021-11-14 NOTE — Patient Instructions (Signed)
Check labs  Start gabapentin '300mg'$  at bedtime  Return to clinic in 3-4 months

## 2021-11-14 NOTE — Progress Notes (Signed)
START ON PATHWAY REGIMEN - Lymphoma and CLL     A cycle is every 7 days:     Rituximab-xxxx   **Always confirm dose/schedule in your pharmacy ordering system**  Patient Characteristics: Marginal Zone Lymphoma, Systemic, First Line, Symptomatic Disease Type: Marginal Zone Lymphoma Disease Type: Not Applicable Disease Type: Not Applicable Localized or Systemic Disease<= Systemic Line of Therapy: First Line Asymptomatic or Symptomatic<= Symptomatic Intent of Therapy: Curative Intent, Discussed with Patient

## 2021-11-16 ENCOUNTER — Other Ambulatory Visit: Payer: Self-pay

## 2021-11-17 ENCOUNTER — Other Ambulatory Visit: Payer: Self-pay | Admitting: Hematology and Oncology

## 2021-11-17 ENCOUNTER — Telehealth: Payer: Self-pay | Admitting: Hematology and Oncology

## 2021-11-17 LAB — COPPER, SERUM: Copper: 97 ug/dL (ref 70–175)

## 2021-11-17 NOTE — Telephone Encounter (Signed)
Scheduled appointment per WQ. Talked to the patients wife and she is aware of the patients upcoming appointments.

## 2021-11-18 NOTE — Progress Notes (Unsigned)
Cardiology Office Note:    Date:  11/20/2021   ID:  Charles [name], DOB Aug 29, 1955, MRN [id number]  PCP:  Owens Loffler, MD  Cardiologist:  None  Electrophysiologist:  None   Referring MD: Owens Loffler, MD   Chief Complaint  Patient presents with   Coronary Artery Disease    History of Present Illness:    Charles [name] is a 66 y.o. male with a hx of T2DM, lymphoma who presents for follow-up.  He was referred by Dr. Lorelei Pont for evaluation of CAD, initially seen on 10/27/2019.  He reports that he has been having intermittent chest pain, occurs a few times per month.  Describes sharp pain in the center of his chest that can last up to 1 minute.  Can occur at rest or with exertion.  Also reports getting short of breath with exertion.  Also has been having lightheadedness when he stands.  Denies any syncope, palpitations, or lower extremity edema.  Golfs 2-3 times per week for exercise, walks the course.  Smoked for 20 years, quit in 2002.  Family history includes father had MI at age 76, CVA in 24s.  Calcium score 155 (63rd percentile) on 10/03/2019.  Coronary CTA was done on 01/18/2020, which showed calcium score 165 (62nd percentile), nonobstructive CAD with mixed plaque in mid LAD causing 24 to 49% stenosis, calcified plaque in proximal LAD causing 0 to 24% stenosis, noncalcified plaque in proximal RCA causing 25 to 49% stenosis, calcified plaque in proximal and mid RCA causing 0 to 24% stenosis.  Echocardiogram on 11/03/2019 showed normal biventricular function, no significant valvular disease.  Since last clinic visit, he reports he is doing okay.  He is starting treatment for lymphoma next week.  Denies any chest pain, dyspnea, lightheadedness, syncope, lower extremity edema, or palpitations.    Past Medical History:  Diagnosis Date   Anxiety    Controlled type 2 diabetes mellitus without complication, without long-term current use of insulin ([place])    Diverticulitis     Generalized anxiety disorder 10/21/2014   GERD (gastroesophageal reflux disease)    Lymphoma ([place])    Marginal zone B-cell lymphoma ([place]) 06/10/2020   Prostate cancer ([place]) 04/02/2021   Tubular adenoma of colon     Past Surgical History:  Procedure Laterality Date   ACHILLES TENDON REPAIR  2001   BICEPS TENDON REPAIR Left 2014   COLONOSCOPY  2012   polyps-[place]   HEMORROIDECTOMY  2012   ROTATOR CUFF REPAIR Left 2014   TOTAL HIP ARTHROPLASTY Right 12/07/2014   Procedure: RIGHT TOTAL HIP ARTHROPLASTY ANTERIOR APPROACH;  Surgeon: Mcarthur Rossetti, MD;  Location: [facility];  Service: Orthopedics;  Laterality: Right;    Current Medications: Current Meds  Medication Sig   aspirin EC 81 MG tablet Take 81 mg by mouth daily.   Cetirizine HCl 10 MG CAPS    CLINPRO 5000 1.1 % PSTE Place onto teeth daily.   Flaxseed, Linseed, (FLAX SEED OIL PO) Take by mouth.   FLUoxetine (PROZAC) 20 MG capsule Take 1 capsule (20 mg total) by mouth daily.   gabapentin (NEURONTIN) 300 MG capsule Take 1 capsule (300 mg total) by mouth at bedtime.   glucose blood (ONETOUCH VERIO) test strip Use to check blood sugar up to 2 times a day.   Lancets (ONETOUCH DELICA PLUS [id number]) MISC Use to check blood sugar up to 2 times a day.   LORazepam (ATIVAN) 0.5 MG tablet Take 1 tablet (0.5 mg total) by mouth every  8 (eight) hours as needed. for anxiety   meloxicam (MOBIC) 15 MG tablet TAKE 1 TABLET (15 MG TOTAL) BY MOUTH DAILY.   Multiple Vitamins-Minerals (MULTIVITAMIN WITH MINERALS) tablet Take 1 tablet by mouth daily.   Pseudoephedrine HCl (SUDAFED PO) Take 1 tablet by mouth daily as needed (allergies).    sildenafil (VIAGRA) 100 MG tablet Take 0.5-1 tablets (50-100 mg total) by mouth daily as needed for erectile dysfunction.   [DISCONTINUED] atorvastatin (LIPITOR) 40 MG tablet TAKE 1 TABLET BY MOUTH EVERY DAY     Allergies:   Patient has no known allergies.   Social History   Socioeconomic History   Marital  status: Married    Spouse name: Lovey Newcomer   Number of children: 1   Years of education: Not on file   Highest education level: Not on file  Occupational History   Occupation: insurance adj retired  Tobacco Use   Smoking status: Former    Packs/day: 0.50    Years: 20.00    Total pack years: 10.00    Types: Cigarettes    Quit date: 01/06/2000    Years since quitting: 21.8   Smokeless tobacco: Former    Types: Chew    Quit date: 01/06/2004  Vaping Use   Vaping Use: Never used  Substance and Sexual Activity   Alcohol use: No    Alcohol/week: 0.0 standard drinks of alcohol    Comment: occ/ heavy drinker in past    Drug use: No   Sexual activity: Yes    Partners: Female  Other Topics Concern   Not on file  Social History Narrative   Married, Arts administrator baseball player   Avid golfer      Are you right handed or left handed? Right Handed    Are you currently employed ? 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 No   Who lives with you? With Wife    What type of home do you live in: 1 story or 2 story? Two story home          Wife is a retired Barrister's clerk: Not on Art therapist Insecurity: Not on file  Transportation Needs: Not on file  Physical Activity: Not on file  Stress: Not on file  Social Connections: Not on file     Family History: The patient's family history includes Alcohol abuse in his father; Breast cancer (age of onset: 25) in his mother; Heart attack in his father; Hyperlipidemia in his father; Hypertension in his father and mother; Stroke in his father. There is no history of Colon cancer, Colon polyps, Esophageal cancer, Rectal cancer, or Stomach cancer.  ROS:   Please see the history of present illness.     All other systems reviewed and are negative.  EKGs/Labs/Other Studies Reviewed:    The following studies were reviewed  today:   EKG:   11/20/21: Normal sinus rhythm, rate 69, no ST abnormalities   Recent Labs: 10/20/2021: ALT 37; BUN 17; Creatinine 0.73; Hemoglobin 15.2; Platelet Count 210; Potassium 4.1; Sodium 137 11/14/2021: TSH 3.34  Recent Lipid Panel    Component Value Date/Time   CHOL 142 04/02/2021 1015   CHOL 142 02/01/2020 0929   TRIG 82.0 04/02/2021 1015   HDL 45.30 04/02/2021 1015   HDL 41 02/01/2020 0929  CHOLHDL 3 04/02/2021 1015   VLDL 16.4 04/02/2021 1015   LDLCALC 81 04/02/2021 1015   LDLCALC 69 02/01/2020 0929   LDLDIRECT 141.0 10/21/2016 0833    Physical Exam:    VS:  BP (!) 140/84   Pulse 68   Ht '5\' 10"'$  (1.778 m)   Wt 230 lb (104.3 kg)   SpO2 94%   BMI 33.00 kg/m     Wt Readings from Last 3 Encounters:  11/20/21 230 lb (104.3 kg)  11/14/21 230 lb (104.3 kg)  10/31/21 224 lb (101.6 kg)     GEN:  Well nourished, well developed in no acute distress HEENT: Normal NECK: No JVD; No carotid bruits LYMPHATICS: No lymphadenopathy CARDIAC: RRR, no murmurs, rubs, gallops RESPIRATORY:  Clear to auscultation without rales, wheezing or rhonchi  ABDOMEN: Soft, non-tender, non-distended MUSCULOSKELETAL:  No edema; No deformity  SKIN: Warm and dry NEUROLOGIC:  Alert and oriented x 3 PSYCHIATRIC:  Normal affect   ASSESSMENT:    1. Coronary artery disease involving native coronary artery of native heart without angina pectoris   2. Hyperlipidemia, unspecified hyperlipidemia type   3. Elevated BP without diagnosis of hypertension   4. Lightheadedness    PLAN:    CAD: Calcium score 155 (63rd percentile) on 10/03/2019.  He reports atypical chest pain and exertional dyspnea.  Coronary CTA was done on 01/18/2020, which showed calcium score 165 (62nd percentile), nonobstructive CAD with mixed plaque in mid LAD causing 24 to 49% stenosis, calcified plaque in proximal LAD causing 0 to 24% stenosis, noncalcified plaque in proximal RCA causing 25 to 49% stenosis, calcified plaque  in proximal and mid RCA causing 0 to 24% stenosis.  Echocardiogram on 11/03/2019 showed normal biventricular function, no significant valvular disease. -Continue atorvastatin, LDL 81 on 04/02/2021.  Increase atorvastatin to 80 mg daily  T2DM: A1c 7.2% on 07/14/2021  Elevated BP: BP elevated in clinic today.  Asked to check BP twice daily for next week and call with results  Hyperlipidemia: LDL 81 on 04/02/2021.  Increase atorvastatin to 80 mg daily.  Check fasting lipid panel in 2 to 3 months  Lightheadedness: Description suggest vertigo vs orthostasis, encouraged to stay hydrated.  Not on antihypertensives.  Orthostatics in prior clinic visit showed drop in BP with standing but not meeting criteria for orthostatic hypotension.  -Denies any recent lightheadedness  Leg pain/numbness: normal ABIs on 11/08/19  Lymphoma: Recently diagnosed, planning to start chemotherapy  RTC in 6 months  Medication Adjustments/Labs and Tests Ordered: Current medicines are reviewed at length with the patient today.  Concerns regarding medicines are outlined above.  Orders Placed This Encounter  Procedures   Lipid panel   EKG 12-Lead   Meds ordered this encounter  Medications   atorvastatin (LIPITOR) 80 MG tablet    Sig: Take 1 tablet (80 mg total) by mouth daily.    Dispense:  90 tablet    Refill:  3    Patient Instructions  Medication Instructions:  INCREASE atorvastatin (Lipitor) 80 mg daily  *If you need a refill on your cardiac medications before your next appointment, please call your pharmacy*   Lab Work: Please return for FASTING labs in 2-3 months (Lipid)  Our in office lab hours are Monday-Friday 8:00-4:00, closed for lunch 12:45-1:45 pm.  No appointment needed.  [facility] locations:   [place] ([place]) - [address]  [place] - [address] - [address] [place] [address] ([facility])   Follow-Up: At [facility], you and your health needs are our priority.  As part of our continuing mission to provide you with exceptional heart care, we have created designated Provider Care Teams.  These Care Teams include your primary Cardiologist (physician) and Advanced Practice Providers (APPs -  Physician Assistants and Nurse Practitioners) who all work together to provide you with the care you need, when you need it.  We recommend signing up for the patient portal called "MyChart".  Sign up information is provided on this After Visit Summary.  MyChart is used to connect with patients for Virtual Visits (Telemedicine).  Patients are able to view lab/test results, encounter notes, upcoming appointments, etc.  Non-urgent messages can be sent to your provider as well.   To learn more about what you can do with MyChart, go to [email or web address].    Your next appointment:   6 month(s)  The format for your next appointment:   In Person  Provider:   Dr. Gardiner Rhyme  Other Instructions Please check your blood pressure at home daily, write it down.  Call the office or send message via Mychart with the readings in 2 weeks for Dr. Gardiner Rhyme to review.             Signed, Donato Heinz, MD  11/20/2021 5:53 PM    [place]

## 2021-11-20 ENCOUNTER — Ambulatory Visit: Payer: Medicare HMO | Attending: Cardiology | Admitting: Cardiology

## 2021-11-20 ENCOUNTER — Encounter: Payer: Self-pay | Admitting: Cardiology

## 2021-11-20 VITALS — BP 140/84 | HR 68 | Ht 70.0 in | Wt 230.0 lb

## 2021-11-20 DIAGNOSIS — R42 Dizziness and giddiness: Secondary | ICD-10-CM | POA: Diagnosis not present

## 2021-11-20 DIAGNOSIS — E785 Hyperlipidemia, unspecified: Secondary | ICD-10-CM

## 2021-11-20 DIAGNOSIS — I2510 Atherosclerotic heart disease of native coronary artery without angina pectoris: Secondary | ICD-10-CM

## 2021-11-20 DIAGNOSIS — R030 Elevated blood-pressure reading, without diagnosis of hypertension: Secondary | ICD-10-CM | POA: Diagnosis not present

## 2021-11-20 MED ORDER — ATORVASTATIN CALCIUM 80 MG PO TABS: 80.0000 mg | ORAL_TABLET | Freq: Every day | ORAL | 3 refills | Status: DC

## 2021-11-20 NOTE — Patient Instructions (Signed)
Medication Instructions:  INCREASE atorvastatin (Lipitor) 80 mg daily  *If you need a refill on your cardiac medications before your next appointment, please call your pharmacy*   Lab Work: Please return for FASTING labs in 2-3 months (Lipid)  Our in office lab hours are Monday-Friday 8:00-4:00, closed for lunch 12:45-1:45 pm.  No appointment needed.  [facility] locations:   [place] ([place]) - [address] [place] [address] - [address] [place] - [address] ([facility])   Follow-Up: At [facility], you and your health needs are our priority.  As part of our continuing mission to provide you with exceptional heart care, we have created designated Provider Care Teams.  These Care Teams include your primary Cardiologist (physician) and Advanced Practice Providers (APPs -  Physician Assistants and Nurse Practitioners) who all work together to provide you with the care you need, when you need it.  We recommend signing up for the patient portal called "MyChart".  Sign up information is provided on this After Visit Summary.  MyChart is used to connect with patients for Virtual Visits (Telemedicine).  Patients are able to view lab/test results, encounter notes, upcoming appointments, etc.  Non-urgent messages can be sent to your provider as well.   To learn more about what you can do with MyChart, go to [email or web address].    Your next appointment:   6 month(s)  The format for your next appointment:   In Person  Provider:   Dr. Gardiner Rhyme  Other Instructions Please check your blood pressure at home daily, write it down.  Call the office or send message via Mychart with the readings  in 2 weeks for Dr. Gardiner Rhyme to review.

## 2021-11-21 ENCOUNTER — Inpatient Hospital Stay: Payer: Medicare HMO

## 2021-11-21 ENCOUNTER — Inpatient Hospital Stay: Payer: Medicare HMO | Attending: Hematology and Oncology | Admitting: Hematology and Oncology

## 2021-11-21 ENCOUNTER — Encounter: Payer: Self-pay | Admitting: Hematology and Oncology

## 2021-11-21 ENCOUNTER — Other Ambulatory Visit: Payer: Self-pay | Admitting: Hematology and Oncology

## 2021-11-21 VITALS — BP 144/70 | HR 81 | Resp 18 | Ht 70.0 in | Wt 230.2 lb

## 2021-11-21 DIAGNOSIS — C884 Extranodal marginal zone B-cell lymphoma of mucosa-associated lymphoid tissue [MALT-lymphoma]: Secondary | ICD-10-CM | POA: Insufficient documentation

## 2021-11-21 DIAGNOSIS — C61 Malignant neoplasm of prostate: Secondary | ICD-10-CM

## 2021-11-21 DIAGNOSIS — C8580 Other specified types of non-Hodgkin lymphoma, unspecified site: Secondary | ICD-10-CM | POA: Diagnosis not present

## 2021-11-21 DIAGNOSIS — E119 Type 2 diabetes mellitus without complications: Secondary | ICD-10-CM | POA: Diagnosis not present

## 2021-11-21 DIAGNOSIS — E669 Obesity, unspecified: Secondary | ICD-10-CM

## 2021-11-21 DIAGNOSIS — R030 Elevated blood-pressure reading, without diagnosis of hypertension: Secondary | ICD-10-CM | POA: Insufficient documentation

## 2021-11-21 DIAGNOSIS — Z5112 Encounter for antineoplastic immunotherapy: Secondary | ICD-10-CM | POA: Diagnosis not present

## 2021-11-21 DIAGNOSIS — E66811 Obesity, class 1: Secondary | ICD-10-CM

## 2021-11-21 LAB — CBC WITH DIFFERENTIAL (CANCER CENTER ONLY)
Abs Immature Granulocytes: 0.01 10*3/uL (ref 0.00–0.07)
Basophils Absolute: 0 10*3/uL (ref 0.0–0.1)
Basophils Relative: 1 %
Eosinophils Absolute: 0.5 10*3/uL (ref 0.0–0.5)
Eosinophils Relative: 9 %
HCT: 43.8 % (ref 39.0–52.0)
Hemoglobin: 15.4 g/dL (ref 13.0–17.0)
Immature Granulocytes: 0 %
Lymphocytes Relative: 20 %
Lymphs Abs: 1.1 10*3/uL (ref 0.7–4.0)
MCH: 32 pg (ref 26.0–34.0)
MCHC: 35.2 g/dL (ref 30.0–36.0)
MCV: 91.1 fL (ref 80.0–100.0)
Monocytes Absolute: 0.5 10*3/uL (ref 0.1–1.0)
Monocytes Relative: 9 %
Neutro Abs: 3.4 10*3/uL (ref 1.7–7.7)
Neutrophils Relative %: 61 %
Platelet Count: 208 10*3/uL (ref 150–400)
RBC: 4.81 MIL/uL (ref 4.22–5.81)
RDW: 12.5 % (ref 11.5–15.5)
WBC Count: 5.6 10*3/uL (ref 4.0–10.5)
nRBC: 0 % (ref 0.0–0.2)

## 2021-11-21 LAB — CMP (CANCER CENTER ONLY)
ALT: 34 U/L (ref 0–44)
AST: 24 U/L (ref 15–41)
Albumin: 4.2 g/dL (ref 3.5–5.0)
Alkaline Phosphatase: 92 U/L (ref 38–126)
Anion gap: 5 (ref 5–15)
BUN: 15 mg/dL (ref 8–23)
CO2: 29 mmol/L (ref 22–32)
Calcium: 9.3 mg/dL (ref 8.9–10.3)
Chloride: 103 mmol/L (ref 98–111)
Creatinine: 1.17 mg/dL (ref 0.61–1.24)
GFR, Estimated: >60 mL/min (ref >60)
Glucose, Bld: 261 mg/dL — ABNORMAL HIGH (ref 70–99)
Potassium: 4.1 mmol/L (ref 3.5–5.1)
Sodium: 137 mmol/L (ref 135–145)
Total Bilirubin: 0.8 mg/dL (ref 0.3–1.2)
Total Protein: 7.1 g/dL (ref 6.5–8.1)

## 2021-11-21 LAB — HEPATITIS B SURFACE ANTIGEN: Hepatitis B Surface Ag: NONREACTIVE

## 2021-11-21 LAB — HEPATITIS B CORE ANTIBODY, TOTAL: Hep B Core Total Ab: NONREACTIVE

## 2021-11-21 NOTE — Assessment & Plan Note (Addendum)
I have reviewed documentation from the other institution We discussed risk, benefits, side effects of single agent rituximab I plan to repeat imaging study in 3 months after completion of rituximab We discussed role of vitamin D supplement

## 2021-11-21 NOTE — Progress Notes (Signed)
Pharmacist Chemotherapy Monitoring - Initial Assessment    Anticipated start date: 11/24/21   The following has been reviewed per standard work regarding the patient's treatment regimen: The patient's diagnosis, treatment plan and drug doses, and organ/hematologic function Lab orders and baseline tests specific to treatment regimen  The treatment plan start date, drug sequencing, and pre-medications Prior authorization status  Patient's documented medication list, including drug-drug interaction screen and prescriptions for anti-emetics and supportive care specific to the treatment regimen The drug concentrations, fluid compatibility, administration routes, and timing of the medications to be used The patient's access for treatment and lifetime cumulative dose history, if applicable  The patient's medication allergies and previous infusion related reactions, if applicable   Changes made to treatment plan:  N/A  Follow up needed:  N/A   Larene Beach, RPH, 11/21/2021  3:34 PM

## 2021-11-21 NOTE — Assessment & Plan Note (Signed)
He has intermittent elevated blood pressure likely due to anxiety We discussed close monitoring of blood pressure while on treatment For now, I am not keen to start him on any medications

## 2021-11-21 NOTE — Progress Notes (Signed)
[facility] OFFICE PROGRESS NOTE  Patient Care Team: Owens Loffler, MD as PCP - General (Family Medicine) Alda Berthold, DO as Consulting Physician (Neurology)  ASSESSMENT & PLAN:  Marginal zone B-cell lymphoma ([place]) I have reviewed documentation from the other institution We discussed risk, benefits, side effects of single agent rituximab I plan to repeat imaging study in 3 months after completion of rituximab We discussed role of vitamin D supplement   Controlled type 2 diabetes mellitus without complication, without long-term current use of insulin ([place]) His blood sugar is very high We discussed importance of dietary modification while on treatment  Elevated BP without diagnosis of hypertension He has intermittent elevated blood pressure likely due to anxiety We discussed close monitoring of blood pressure while on treatment For now, I am not keen to start him on any medications  No orders of the defined types were placed in this encounter.   All questions were answered. The patient knows to call the clinic with any problems, questions or concerns. The total time spent in the appointment was 40 minutes encounter with patients including review of chart and various tests results, discussions about plan of care and coordination of care plan   [name], MD 11/21/2021 3:18 PM  INTERVAL HISTORY: Please see below for problem oriented charting. he returns for treatment follow-up and review of treatment recommendations He denies new symptoms We spent a lot of time reviewing potential treatment side effects We discussed lifestyle changes and blood pressure monitoring  REVIEW OF SYSTEMS:   Constitutional: Denies fevers, chills or abnormal weight loss Eyes: Denies blurriness of vision Ears, nose, mouth, throat, and face: Denies mucositis or sore throat Respiratory: Denies cough, dyspnea or wheezes Cardiovascular: Denies palpitation, chest discomfort or lower  extremity swelling Gastrointestinal:  Denies nausea, heartburn or change in bowel habits Skin: Denies abnormal skin rashes Lymphatics: Denies new lymphadenopathy or easy bruising Neurological:Denies numbness, tingling or new weaknesses Behavioral/Psych: Mood is stable, no new changes  All other systems were reviewed with the patient and are negative.  I have reviewed the past medical history, past surgical history, social history and family history with the patient and they are unchanged from previous note.  ALLERGIES:  has No Known Allergies.  MEDICATIONS:  Current Outpatient Medications  Medication Sig Dispense Refill   cholecalciferol (VITAMIN D3) 25 MCG (1000 UNIT) tablet Take 2,000 Units by mouth daily.     Alcohol Swabs (B-D SINGLE USE SWABS REGULAR) PADS Use to check blood sugar up to 2 times a day 100 each 5   aspirin EC 81 MG tablet Take 81 mg by mouth daily.     atorvastatin (LIPITOR) 80 MG tablet Take 1 tablet (80 mg total) by mouth daily. 90 tablet 3   Cetirizine HCl 10 MG CAPS      CLINPRO 5000 1.1 % PSTE Place onto teeth daily.     Flaxseed, Linseed, (FLAX SEED OIL PO) Take by mouth.     FLUoxetine (PROZAC) 20 MG capsule Take 1 capsule (20 mg total) by mouth daily. 30 capsule 3   gabapentin (NEURONTIN) 300 MG capsule Take 1 capsule (300 mg total) by mouth at bedtime. 30 capsule 5   glucose blood (ONETOUCH VERIO) test strip Use to check blood sugar up to 2 times a day. 100 each 5   Lancets (ONETOUCH DELICA PLUS [id number]) MISC Use to check blood sugar up to 2 times a day. 100 each 5   LORazepam (ATIVAN) 0.5 MG tablet Take 1  tablet (0.5 mg total) by mouth every 8 (eight) hours as needed. for anxiety 30 tablet 1   meloxicam (MOBIC) 15 MG tablet TAKE 1 TABLET (15 MG TOTAL) BY MOUTH DAILY. 30 tablet 5   Multiple Vitamins-Minerals (MULTIVITAMIN WITH MINERALS) tablet Take 1 tablet by mouth daily.     sildenafil (VIAGRA) 100 MG tablet Take 0.5-1 tablets (50-100 mg total) by mouth  daily as needed for erectile dysfunction. 5 tablet 11   No current facility-administered medications for this visit.    SUMMARY OF ONCOLOGIC HISTORY: Oncology History  Marginal zone B-cell lymphoma ([place])  05/21/2020 Procedure   Colonoscopy by Dr. Hilarie Fredrickson - One 4 mm polyp in the ascending colon, removed with a cold snare. Resected and retrieved. - One 25 mm polyp in the proximal transverse colon, removed piecemeal using a cold snare. Resected and retrieved. Tattooed. - Diverticulosis from cecum to sigmoid colon. - The distal rectum and anal verge are normal on retroflexion view.   05/21/2020 Pathology Results   1. Surgical [P], colon, ascending, polyp (1) - TUBULAR ADENOMA. - NO HIGH GRADE DYSPLASIA OR CARCINOMA. 2. Surgical [P], colon, transverse, polyp (1) - ATYPICAL LYMPHOID PROLIFERATION. - SEE MICROSCOPIC DESCRIPTION. Microscopic Comment 2. The sections show a dense, diffuse and relatively monomorphic lymphoid infiltrate involving the lamina propria and submucosa and mostly characterized by small to medium sized lymphocytes with round to slightly irregular nuclei and moderately abundant clear cytoplasm imparting a monocytoid appearance. The infiltrate displays scattered small foci of lymphoepithelial lesions and occasionally surround very small "naked" germinal centers. A battery of immunohistochemical stains was performed and shows that the lymphoid infiltrate is predominantly composed of B cells as seen with CD20 associated with diffuse positivity for Bcl-2 and CD43. No significant CD10 or cyclin D1 positivity is identified. BCL6 highlights scattering of small positive clusters correlating with previously described germinal centers. Ki-67 shows very low expression (less than 10%). In situ hybridization for kappa and lambda highlight a polyclonal superficial plasma cell component. The findings are highly suspicious for involvement by low-grade B-cell lymphoma particularly extranodal  marginal zone lymphoma. FISH and gene rearrangement study studies will be performed and the results reported in an addendum. Dr. Gari Crown reviewed this part and cocnurs. Claudette Laws MD Pathologist, Electronic Signature (Case signed 05/27/2020)    06/24/2020 PET scan   No signs of nodal disease in the neck, chest, abdomen or pelvis.   Focal area of uptake in the LEFT hemi prostate, nonspecific but raising the question of prostate neoplasm. Urologic consultation is suggested for further evaluation.   Aortic atherosclerosis and coronary artery disease.   Maxillary sinus disease.   Aortic Atherosclerosis (ICD10-I70.0).   06/24/2020 Cancer Staging   Staging form: Hodgkin and Non-Hodgkin Lymphoma, AJCC 8th Edition - Clinical stage from 06/24/2020: Stage IE (Marginal zone lymphoma) - Signed by [name], MD on 06/24/2020 Stage prefix: Initial diagnosis   09/30/2021 Procedure   - The examined portion of the ileum was normal. - One 1 mm polyp in the cecum, removed with a cold biopsy forceps. Resected and retrieved. - Post-polypectomy scar with distal tattoo in the proximal transverse colon. No evidence of residual polyp/tumor. Biopsied. - One 4 mm polyp in the transverse colon, removed with a cold snare. Complete resection. Polyp tissue not retrieved. - Moderate diverticulosis from ascending colon to sigmoid colon. - The distal rectum and anal verge are normal on retroflexion view.   09/30/2021 Pathology Results   Diagnosis 1. Surgical [P], colon, cecum, polyp (1) - POLYPOID FRAGMENT  OF BENIGN COLONIC MUCOSA WITH NO SIGNIFICANT PATHOLOGIC CHANGES 2. Surgical [P], proximal transverse colon scar - ATYPICAL LYMPHOID INFILTRATE (SEE NOTE) Diagnosis Note 2. - Morphologic evaluation reveals colonic mucosa with a single prominent lymphoid aggregate, and no lymphoepithelial lesions. The cells are positive for CD20, CD43 and negative for CD3. Definitive clonality cannot be established given the lack of  kappa and lambda ISH staining. Normal colonic mucosa is seen elsewhere. The findings are not diagnostic of, however in the given clinical context of the patient's history of extranodal MALT lymphoma, are suspicious for involvement. Dr. Arby Barrette has reviewed the case and agrees with the interpretation. Clinical and radiologic correlation is recommended.   10/31/2021 PET scan   1. No hypermetabolic adenopathy above or below the diaphragm. 2. No splenomegaly or evidence of osseous lymphomatous involvement 3. Increased short segment uptake in the sigmoid colon along an area of diverticulosis with some associated wall thickening but no adjacent inflammation. While this may reflect physiologic activity versus Segmental colitis associated with diverticulosis, an underlying colonic mass is a differential consideration not excluded on this study, recommend correlation with history of colon cancer screening and colonoscopy if clinically indicated. 4.  Aortic Atherosclerosis (ICD10-I70.0).   11/24/2021 -  Chemotherapy   Patient is on Treatment Plan : NON-HODGKINS LYMPHOMA Rituximab q7d     Prostate cancer ([place])  04/02/2021 Initial Diagnosis   Prostate cancer ([place])   06/24/2021 Cancer Staging   Staging form: Prostate, AJCC 8th Edition - Clinical stage from 06/24/2021: cT1, cN0, cM0, Grade Group: 1 - Signed by [name], MD on 06/24/2021 Stage prefix: Initial diagnosis Histologic grading system: 5 grade system     PHYSICAL EXAMINATION: ECOG PERFORMANCE STATUS: 0 - Asymptomatic  Vitals:   11/21/21 1441  BP: (!) 144/70  Pulse: 81  Resp: 18  SpO2: 97%   Filed Weights   11/21/21 1441  Weight: 230 lb 3.2 oz (104.4 kg)    GENERAL:alert, no distress and comfortable NEURO: alert & oriented x 3 with fluent speech, no focal motor/sensory deficits  LABORATORY DATA:  I have reviewed the data as listed    Component Value Date/Time   NA 137 11/21/2021 1422   NA 139 01/11/2020 1249   K 4.1  11/21/2021 1422   CL 103 11/21/2021 1422   CO2 29 11/21/2021 1422   GLUCOSE 261 (H) 11/21/2021 1422   BUN 15 11/21/2021 1422   BUN 14 01/11/2020 1249   CREATININE 1.17 11/21/2021 1422   CALCIUM 9.3 11/21/2021 1422   PROT 7.1 11/21/2021 1422   ALBUMIN 4.2 11/21/2021 1422   AST 24 11/21/2021 1422   ALT 34 11/21/2021 1422   ALKPHOS 92 11/21/2021 1422   BILITOT 0.8 11/21/2021 1422   GFRNONAA >60 11/21/2021 1422   GFRAA 91 01/11/2020 1249    No results found for: "SPEP", "UPEP"  Lab Results  Component Value Date   WBC 5.6 11/21/2021   NEUTROABS 3.4 11/21/2021   HGB 15.4 11/21/2021   HCT 43.8 11/21/2021   MCV 91.1 11/21/2021   PLT 208 11/21/2021      Chemistry      Component Value Date/Time   NA 137 11/21/2021 1422   NA 139 01/11/2020 1249   K 4.1 11/21/2021 1422   CL 103 11/21/2021 1422   CO2 29 11/21/2021 1422   BUN 15 11/21/2021 1422   BUN 14 01/11/2020 1249   CREATININE 1.17 11/21/2021 1422      Component Value Date/Time   CALCIUM 9.3 11/21/2021 1422  ALKPHOS 92 11/21/2021 1422   AST 24 11/21/2021 1422   ALT 34 11/21/2021 1422   BILITOT 0.8 11/21/2021 1422

## 2021-11-21 NOTE — Assessment & Plan Note (Signed)
His blood sugar is very high We discussed importance of dietary modification while on treatment

## 2021-11-24 ENCOUNTER — Inpatient Hospital Stay: Payer: Medicare HMO

## 2021-11-24 VITALS — BP 122/78 | HR 68 | Temp 98.0°F | Resp 18 | Wt 228.4 lb

## 2021-11-24 DIAGNOSIS — C884 Extranodal marginal zone B-cell lymphoma of mucosa-associated lymphoid tissue [MALT-lymphoma]: Secondary | ICD-10-CM | POA: Diagnosis not present

## 2021-11-24 DIAGNOSIS — C8580 Other specified types of non-Hodgkin lymphoma, unspecified site: Secondary | ICD-10-CM

## 2021-11-24 DIAGNOSIS — Z5112 Encounter for antineoplastic immunotherapy: Secondary | ICD-10-CM | POA: Diagnosis not present

## 2021-11-24 DIAGNOSIS — C61 Malignant neoplasm of prostate: Secondary | ICD-10-CM | POA: Diagnosis not present

## 2021-11-24 DIAGNOSIS — E119 Type 2 diabetes mellitus without complications: Secondary | ICD-10-CM | POA: Diagnosis not present

## 2021-11-24 DIAGNOSIS — R030 Elevated blood-pressure reading, without diagnosis of hypertension: Secondary | ICD-10-CM | POA: Diagnosis not present

## 2021-11-24 MED ORDER — DIPHENHYDRAMINE HCL 25 MG PO CAPS: 50.0000 mg | ORAL_CAPSULE | Freq: Once | ORAL | Status: DC

## 2021-11-24 MED ORDER — ACETAMINOPHEN 325 MG PO TABS
650.0000 mg | ORAL_TABLET | Freq: Once | ORAL | Status: AC
  Administered 2021-11-24: 650 mg via ORAL
  Filled 2021-11-24: qty 2

## 2021-11-24 MED ORDER — CETIRIZINE HCL 10 MG/ML IV SOLN
10.0000 mg | Freq: Once | INTRAVENOUS | Status: AC
  Administered 2021-11-24: 10 mg via INTRAVENOUS
  Filled 2021-11-24: qty 1

## 2021-11-24 MED ORDER — SODIUM CHLORIDE 0.9 % IV SOLN
375.0000 mg/m2 | Freq: Once | INTRAVENOUS | Status: AC
  Administered 2021-11-24: 800 mg via INTRAVENOUS
  Filled 2021-11-24: qty 50

## 2021-11-24 MED ORDER — SODIUM CHLORIDE 0.9 % IV SOLN: Freq: Once | INTRAVENOUS | Status: AC

## 2021-11-24 NOTE — Patient Instructions (Signed)
[facility]  Discharge Instructions: Thank you for choosing [facility] to provide your oncology and hematology care.   If you have a lab appointment with the [facility], please go directly to the [facility] and check in at the registration area.   Wear comfortable clothing and clothing appropriate for easy access to any Portacath or PICC line.   We strive to give you quality time with your provider. You may need to reschedule your appointment if you arrive late (15 or more minutes).  Arriving late affects you and other patients whose appointments are after yours.  Also, if you miss three or more appointments without notifying the office, you may be dismissed from the clinic at the provider's discretion.      For prescription refill requests, have your pharmacy contact our office and allow 72 hours for refills to be completed.    Today you received the following chemotherapy and/or immunotherapy agents: RItuxan To help prevent nausea and vomiting after your treatment, we encourage you to take your nausea medication as directed.  BELOW ARE SYMPTOMS THAT SHOULD BE REPORTED IMMEDIATELY: *FEVER GREATER THAN 100.4 F (38 C) OR HIGHER *CHILLS OR SWEATING *NAUSEA AND VOMITING THAT IS NOT CONTROLLED WITH YOUR NAUSEA MEDICATION *UNUSUAL SHORTNESS OF BREATH *UNUSUAL BRUISING OR BLEEDING *URINARY PROBLEMS (pain or burning when urinating, or frequent urination) *BOWEL PROBLEMS (unusual diarrhea, constipation, pain near the anus) TENDERNESS IN MOUTH AND THROAT WITH OR WITHOUT PRESENCE OF ULCERS (sore throat, sores in mouth, or a toothache) UNUSUAL RASH, SWELLING OR PAIN  UNUSUAL VAGINAL DISCHARGE OR ITCHING   Items with * indicate a potential emergency and should be followed up as soon as possible or go to the Emergency Department if any problems should occur.  Please show the CHEMOTHERAPY ALERT CARD or IMMUNOTHERAPY ALERT CARD at check-in to the  Emergency Department and triage nurse.  Should you have questions after your visit or need to cancel or reschedule your appointment, please contact [facility]  Dept: [phone]  and follow the prompts.  Office hours are 8:00 a.m. to 4:30 p.m. Monday - Friday. Please note that voicemails left after 4:00 p.m. may not be returned until the following business day.  We are closed weekends and major holidays. You have access to a nurse at all times for urgent questions. Please call the main number to the clinic Dept: [phone] and follow the prompts.   For any non-urgent questions, you may also contact your provider using MyChart. We now offer e-Visits for anyone 82 and older to request care online for non-urgent symptoms. For details visit [email or web address].   Also download the MyChart app! Go to the app store, search "MyChart", open the app, select , and log in with your MyChart username and password.  Masks are optional in the cancer centers. If you would like for your care team to wear a mask while they are taking care of you, please let them know. You may have one support person who is at least 68 years old accompany you for your appointments.

## 2021-11-24 NOTE — Progress Notes (Signed)
The pharmacy team has substituted diphenhydramine for IV cetirizine as a premedication. Patient will be monitored for hypersensitivity reaction and adverse reactions to IV cetirizine. Thanks.   Kennith Center, Pharm.D., CPP 11/24/2021'@8'$ :54 AM

## 2021-11-25 ENCOUNTER — Telehealth: Payer: Self-pay

## 2021-11-25 NOTE — Telephone Encounter (Signed)
Charles [name] states that he is doing fine. He is eating,drinking, and urinating well. He knows to call the the office at [phone] if he has any questions or concerns.

## 2021-11-25 NOTE — Telephone Encounter (Signed)
-----   Message from Tildon Husky, RN sent at 11/24/2021  1:52 PM EST ----- Regarding: first time call back- rituxan/ [place] Patient received rituxan today for the first time with no issues. He is seen by Dr. Alvy Bimler :)

## 2021-12-01 ENCOUNTER — Inpatient Hospital Stay (HOSPITAL_BASED_OUTPATIENT_CLINIC_OR_DEPARTMENT_OTHER): Payer: Medicare HMO | Admitting: Hematology and Oncology

## 2021-12-01 ENCOUNTER — Inpatient Hospital Stay: Payer: Medicare HMO

## 2021-12-01 ENCOUNTER — Encounter: Payer: Self-pay | Admitting: Hematology and Oncology

## 2021-12-01 VITALS — BP 138/75 | HR 60 | Temp 98.3°F | Resp 18

## 2021-12-01 DIAGNOSIS — C61 Malignant neoplasm of prostate: Secondary | ICD-10-CM | POA: Diagnosis not present

## 2021-12-01 DIAGNOSIS — C8580 Other specified types of non-Hodgkin lymphoma, unspecified site: Secondary | ICD-10-CM | POA: Diagnosis not present

## 2021-12-01 DIAGNOSIS — E119 Type 2 diabetes mellitus without complications: Secondary | ICD-10-CM | POA: Diagnosis not present

## 2021-12-01 DIAGNOSIS — Z5112 Encounter for antineoplastic immunotherapy: Secondary | ICD-10-CM | POA: Diagnosis not present

## 2021-12-01 DIAGNOSIS — C884 Extranodal marginal zone B-cell lymphoma of mucosa-associated lymphoid tissue [MALT-lymphoma]: Secondary | ICD-10-CM | POA: Diagnosis not present

## 2021-12-01 DIAGNOSIS — R030 Elevated blood-pressure reading, without diagnosis of hypertension: Secondary | ICD-10-CM | POA: Diagnosis not present

## 2021-12-01 MED ORDER — SODIUM CHLORIDE 0.9 % IV SOLN: 375.0000 mg/m2 | Freq: Once | INTRAVENOUS | Status: DC

## 2021-12-01 MED ORDER — ACETAMINOPHEN 325 MG PO TABS
650.0000 mg | ORAL_TABLET | Freq: Once | ORAL | Status: AC
  Administered 2021-12-01: 650 mg via ORAL
  Filled 2021-12-01: qty 2

## 2021-12-01 MED ORDER — SODIUM CHLORIDE 0.9 % IV SOLN
375.0000 mg/m2 | Freq: Once | INTRAVENOUS | Status: AC
  Administered 2021-12-01: 800 mg via INTRAVENOUS
  Filled 2021-12-01: qty 30

## 2021-12-01 MED ORDER — CETIRIZINE HCL 10 MG/ML IV SOLN
10.0000 mg | Freq: Once | INTRAVENOUS | Status: AC
  Administered 2021-12-01: 10 mg via INTRAVENOUS
  Filled 2021-12-01: qty 1

## 2021-12-01 MED ORDER — SODIUM CHLORIDE 0.9 % IV SOLN: Freq: Once | INTRAVENOUS | Status: AC

## 2021-12-01 NOTE — Progress Notes (Signed)
[facility] OFFICE PROGRESS NOTE  Patient Care Team: Owens Loffler, MD as PCP - General (Family Medicine) Alda Berthold, DO as Consulting Physician (Neurology)  ASSESSMENT & PLAN:  Marginal zone B-cell lymphoma [facility]) He tolerated treatment well without any side effects I plan to repeat imaging study in 3 months after completion of rituximab  Orders Placed This Encounter  Procedures   NM PET Image Restage (PS) Skull Base to Thigh (F-18 FDG)    Standing Status:   Future    Standing Expiration Date:   12/02/2022    Order Specific Question:   If indicated for the ordered procedure, I authorize the administration of a radiopharmaceutical per Radiology protocol    Answer:   Yes    Order Specific Question:   Preferred imaging location?    Answer:   [facility]    Order Specific Question:   Radiology Contrast Protocol - do NOT remove file path    Answer:   [email or web address]    All questions were answered. The patient knows to call the clinic with any problems, questions or concerns. The total time spent in the appointment was 20 minutes encounter with patients including review of chart and various tests results, discussions about plan of care and coordination of care plan   [name], MD 12/01/2021 3:19 PM  INTERVAL HISTORY: Please see below for problem oriented charting. he returns for treatment follow-up seen prior to his second dose of rituximab He have no side effects from treatment No recent changes to his bowel habits  REVIEW OF SYSTEMS:   Constitutional: Denies fevers, chills or abnormal weight loss Eyes: Denies blurriness of vision Ears, nose, mouth, throat, and face: Denies mucositis or sore throat Respiratory: Denies cough, dyspnea or wheezes Cardiovascular: Denies palpitation, chest discomfort or lower extremity swelling Gastrointestinal:  Denies nausea, heartburn or change in bowel habits Skin: Denies  abnormal skin rashes Lymphatics: Denies new lymphadenopathy or easy bruising Neurological:Denies numbness, tingling or new weaknesses Behavioral/Psych: Mood is stable, no new changes  All other systems were reviewed with the patient and are negative.  I have reviewed the past medical history, past surgical history, social history and family history with the patient and they are unchanged from previous note.  ALLERGIES:  has No Known Allergies.  MEDICATIONS:  Current Outpatient Medications  Medication Sig Dispense Refill   Alcohol Swabs (B-D SINGLE USE SWABS REGULAR) PADS Use to check blood sugar up to 2 times a day 100 each 5   aspirin EC 81 MG tablet Take 81 mg by mouth daily.     atorvastatin (LIPITOR) 80 MG tablet Take 1 tablet (80 mg total) by mouth daily. 90 tablet 3   Cetirizine HCl 10 MG CAPS      cholecalciferol (VITAMIN D3) 25 MCG (1000 UNIT) tablet Take 2,000 Units by mouth daily.     CLINPRO 5000 1.1 % PSTE Place onto teeth daily.     Flaxseed, Linseed, (FLAX SEED OIL PO) Take by mouth.     FLUoxetine (PROZAC) 20 MG capsule Take 1 capsule (20 mg total) by mouth daily. 30 capsule 3   gabapentin (NEURONTIN) 300 MG capsule Take 1 capsule (300 mg total) by mouth at bedtime. 30 capsule 5   glucose blood (ONETOUCH VERIO) test strip Use to check blood sugar up to 2 times a day. 100 each 5   Lancets (ONETOUCH DELICA PLUS [id number]) MISC Use to check blood sugar up to 2 times a day. 100 each 5  LORazepam (ATIVAN) 0.5 MG tablet Take 1 tablet (0.5 mg total) by mouth every 8 (eight) hours as needed. for anxiety 30 tablet 1   meloxicam (MOBIC) 15 MG tablet TAKE 1 TABLET (15 MG TOTAL) BY MOUTH DAILY. 30 tablet 5   Multiple Vitamins-Minerals (MULTIVITAMIN WITH MINERALS) tablet Take 1 tablet by mouth daily.     sildenafil (VIAGRA) 100 MG tablet Take 0.5-1 tablets (50-100 mg total) by mouth daily as needed for erectile dysfunction. 5 tablet 11   No current facility-administered medications  for this visit.    SUMMARY OF ONCOLOGIC HISTORY: Oncology History  Marginal zone B-cell lymphoma ([place])  05/21/2020 Procedure   Colonoscopy by Dr. Hilarie Fredrickson - One 4 mm polyp in the ascending colon, removed with a cold snare. Resected and retrieved. - One 25 mm polyp in the proximal transverse colon, removed piecemeal using a cold snare. Resected and retrieved. Tattooed. - Diverticulosis from cecum to sigmoid colon. - The distal rectum and anal verge are normal on retroflexion view.   05/21/2020 Pathology Results   1. Surgical [P], colon, ascending, polyp (1) - TUBULAR ADENOMA. - NO HIGH GRADE DYSPLASIA OR CARCINOMA. 2. Surgical [P], colon, transverse, polyp (1) - ATYPICAL LYMPHOID PROLIFERATION. - SEE MICROSCOPIC DESCRIPTION. Microscopic Comment 2. The sections show a dense, diffuse and relatively monomorphic lymphoid infiltrate involving the lamina propria and submucosa and mostly characterized by small to medium sized lymphocytes with round to slightly irregular nuclei and moderately abundant clear cytoplasm imparting a monocytoid appearance. The infiltrate displays scattered small foci of lymphoepithelial lesions and occasionally surround very small "naked" germinal centers. A battery of immunohistochemical stains was performed and shows that the lymphoid infiltrate is predominantly composed of B cells as seen with CD20 associated with diffuse positivity for Bcl-2 and CD43. No significant CD10 or cyclin D1 positivity is identified. BCL6 highlights scattering of small positive clusters correlating with previously described germinal centers. Ki-67 shows very low expression (less than 10%). In situ hybridization for kappa and lambda highlight a polyclonal superficial plasma cell component. The findings are highly suspicious for involvement by low-grade B-cell lymphoma particularly extranodal marginal zone lymphoma. FISH and gene rearrangement study studies will be performed and the results reported  in an addendum. Dr. Gari Crown reviewed this part and cocnurs. Claudette Laws MD Pathologist, Electronic Signature (Case signed 05/27/2020)    06/24/2020 PET scan   No signs of nodal disease in the neck, chest, abdomen or pelvis.   Focal area of uptake in the LEFT hemi prostate, nonspecific but raising the question of prostate neoplasm. Urologic consultation is suggested for further evaluation.   Aortic atherosclerosis and coronary artery disease.   Maxillary sinus disease.   Aortic Atherosclerosis (ICD10-I70.0).   06/24/2020 Cancer Staging   Staging form: Hodgkin and Non-Hodgkin Lymphoma, AJCC 8th Edition - Clinical stage from 06/24/2020: Stage IE (Marginal zone lymphoma) - Signed by [name], MD on 06/24/2020 Stage prefix: Initial diagnosis   09/30/2021 Procedure   - The examined portion of the ileum was normal. - One 1 mm polyp in the cecum, removed with a cold biopsy forceps. Resected and retrieved. - Post-polypectomy scar with distal tattoo in the proximal transverse colon. No evidence of residual polyp/tumor. Biopsied. - One 4 mm polyp in the transverse colon, removed with a cold snare. Complete resection. Polyp tissue not retrieved. - Moderate diverticulosis from ascending colon to sigmoid colon. - The distal rectum and anal verge are normal on retroflexion view.   09/30/2021 Pathology Results   Diagnosis 1. Surgical [P],  colon, cecum, polyp (1) - POLYPOID FRAGMENT OF BENIGN COLONIC MUCOSA WITH NO SIGNIFICANT PATHOLOGIC CHANGES 2. Surgical [P], proximal transverse colon scar - ATYPICAL LYMPHOID INFILTRATE (SEE NOTE) Diagnosis Note 2. - Morphologic evaluation reveals colonic mucosa with a single prominent lymphoid aggregate, and no lymphoepithelial lesions. The cells are positive for CD20, CD43 and negative for CD3. Definitive clonality cannot be established given the lack of kappa and lambda ISH staining. Normal colonic mucosa is seen elsewhere. The findings are not diagnostic of,  however in the given clinical context of the patient's history of extranodal MALT lymphoma, are suspicious for involvement. Dr. Arby Barrette has reviewed the case and agrees with the interpretation. Clinical and radiologic correlation is recommended.   10/31/2021 PET scan   1. No hypermetabolic adenopathy above or below the diaphragm. 2. No splenomegaly or evidence of osseous lymphomatous involvement 3. Increased short segment uptake in the sigmoid colon along an area of diverticulosis with some associated wall thickening but no adjacent inflammation. While this may reflect physiologic activity versus Segmental colitis associated with diverticulosis, an underlying colonic mass is a differential consideration not excluded on this study, recommend correlation with history of colon cancer screening and colonoscopy if clinically indicated. 4.  Aortic Atherosclerosis (ICD10-I70.0).   11/24/2021 -  Chemotherapy   Patient is on Treatment Plan : NON-HODGKINS LYMPHOMA Rituximab q7d     Prostate cancer ([place])  04/02/2021 Initial Diagnosis   Prostate cancer ([place])   06/24/2021 Cancer Staging   Staging form: Prostate, AJCC 8th Edition - Clinical stage from 06/24/2021: cT1, cN0, cM0, Grade Group: 1 - Signed by [name], MD on 06/24/2021 Stage prefix: Initial diagnosis Histologic grading system: 5 grade system     PHYSICAL EXAMINATION: ECOG PERFORMANCE STATUS: 0 - Asymptomatic  Vitals:   12/01/21 1040  BP: (!) 144/3  Pulse: 71  Resp: 18  Temp: 99 F (37.2 C)  SpO2: 99%   Filed Weights   12/01/21 1040  Weight: 228 lb 6.4 oz (103.6 kg)    GENERAL:alert, no distress and comfortable  NEURO: alert & oriented x 3 with fluent speech, no focal motor/sensory deficits  LABORATORY DATA:  I have reviewed the data as listed    Component Value Date/Time   NA 137 11/21/2021 1422   NA 139 01/11/2020 1249   K 4.1 11/21/2021 1422   CL 103 11/21/2021 1422   CO2 29 11/21/2021 1422   GLUCOSE 261 (H)  11/21/2021 1422   BUN 15 11/21/2021 1422   BUN 14 01/11/2020 1249   CREATININE 1.17 11/21/2021 1422   CALCIUM 9.3 11/21/2021 1422   PROT 7.1 11/21/2021 1422   ALBUMIN 4.2 11/21/2021 1422   AST 24 11/21/2021 1422   ALT 34 11/21/2021 1422   ALKPHOS 92 11/21/2021 1422   BILITOT 0.8 11/21/2021 1422   GFRNONAA >60 11/21/2021 1422   GFRAA 91 01/11/2020 1249    No results found for: "SPEP", "UPEP"  Lab Results  Component Value Date   WBC 5.6 11/21/2021   NEUTROABS 3.4 11/21/2021   HGB 15.4 11/21/2021   HCT 43.8 11/21/2021   MCV 91.1 11/21/2021   PLT 208 11/21/2021      Chemistry      Component Value Date/Time   NA 137 11/21/2021 1422   NA 139 01/11/2020 1249   K 4.1 11/21/2021 1422   CL 103 11/21/2021 1422   CO2 29 11/21/2021 1422   BUN 15 11/21/2021 1422   BUN 14 01/11/2020 1249   CREATININE 1.17 11/21/2021 1422  Component Value Date/Time   CALCIUM 9.3 11/21/2021 1422   ALKPHOS 92 11/21/2021 1422   AST 24 11/21/2021 1422   ALT 34 11/21/2021 1422   BILITOT 0.8 11/21/2021 1422

## 2021-12-01 NOTE — Assessment & Plan Note (Signed)
He tolerated treatment well without any side effects I plan to repeat imaging study in 3 months after completion of rituximab

## 2021-12-04 ENCOUNTER — Telehealth: Payer: Self-pay | Admitting: Hematology and Oncology

## 2021-12-04 NOTE — Telephone Encounter (Signed)
Patient called about an issue in mychart where they are seeing someone else's admission to ED, patient with similar name. Forwarding information to proper channels.

## 2021-12-05 ENCOUNTER — Encounter: Payer: Self-pay | Admitting: Cardiology

## 2021-12-08 ENCOUNTER — Ambulatory Visit: Payer: Medicare HMO | Admitting: Hematology and Oncology

## 2021-12-08 ENCOUNTER — Inpatient Hospital Stay: Payer: Medicare HMO | Attending: Hematology and Oncology

## 2021-12-08 ENCOUNTER — Other Ambulatory Visit: Payer: Self-pay

## 2021-12-08 ENCOUNTER — Other Ambulatory Visit: Payer: Self-pay | Admitting: Hematology and Oncology

## 2021-12-08 VITALS — BP 140/78 | HR 60 | Temp 97.7°F | Resp 20 | Wt 233.2 lb

## 2021-12-08 DIAGNOSIS — C61 Malignant neoplasm of prostate: Secondary | ICD-10-CM | POA: Diagnosis not present

## 2021-12-08 DIAGNOSIS — E119 Type 2 diabetes mellitus without complications: Secondary | ICD-10-CM | POA: Diagnosis not present

## 2021-12-08 DIAGNOSIS — C8580 Other specified types of non-Hodgkin lymphoma, unspecified site: Secondary | ICD-10-CM

## 2021-12-08 DIAGNOSIS — C884 Extranodal marginal zone B-cell lymphoma of mucosa-associated lymphoid tissue [MALT-lymphoma]: Secondary | ICD-10-CM | POA: Insufficient documentation

## 2021-12-08 DIAGNOSIS — Z5112 Encounter for antineoplastic immunotherapy: Secondary | ICD-10-CM | POA: Insufficient documentation

## 2021-12-08 MED ORDER — SODIUM CHLORIDE 0.9 % IV SOLN: Freq: Once | INTRAVENOUS | Status: AC

## 2021-12-08 MED ORDER — SODIUM CHLORIDE 0.9 % IV SOLN
375.0000 mg/m2 | Freq: Once | INTRAVENOUS | Status: AC
  Administered 2021-12-08: 800 mg via INTRAVENOUS
  Filled 2021-12-08: qty 50

## 2021-12-08 MED ORDER — ACETAMINOPHEN 325 MG PO TABS
650.0000 mg | ORAL_TABLET | Freq: Once | ORAL | Status: AC
  Administered 2021-12-08: 650 mg via ORAL
  Filled 2021-12-08: qty 2

## 2021-12-08 MED ORDER — CETIRIZINE HCL 10 MG/ML IV SOLN
10.0000 mg | Freq: Once | INTRAVENOUS | Status: AC
  Administered 2021-12-08: 10 mg via INTRAVENOUS
  Filled 2021-12-08: qty 1

## 2021-12-08 NOTE — Patient Instructions (Addendum)
[facility]  Discharge Instructions: Thank you for choosing [facility] to provide your oncology and hematology care.   If you have a lab appointment with the [facility], please go directly to the [facility] and check in at the registration area.   Wear comfortable clothing and clothing appropriate for easy access to any Portacath or PICC line.   We strive to give you quality time with your provider. You may need to reschedule your appointment if you arrive late (15 or more minutes).  Arriving late affects you and other patients whose appointments are after yours.  Also, if you miss three or more appointments without notifying the office, you may be dismissed from the clinic at the provider's discretion.      For prescription refill requests, have your pharmacy contact our office and allow 72 hours for refills to be completed.    Today you received the following chemotherapy and/or immunotherapy agent: Rituximab (Truxima)   To help prevent nausea and vomiting after your treatment, we encourage you to take your nausea medication as directed.  BELOW ARE SYMPTOMS THAT SHOULD BE REPORTED IMMEDIATELY: *FEVER GREATER THAN 100.4 F (38 C) OR HIGHER *CHILLS OR SWEATING *NAUSEA AND VOMITING THAT IS NOT CONTROLLED WITH YOUR NAUSEA MEDICATION *UNUSUAL SHORTNESS OF BREATH *UNUSUAL BRUISING OR BLEEDING *URINARY PROBLEMS (pain or burning when urinating, or frequent urination) *BOWEL PROBLEMS (unusual diarrhea, constipation, pain near the anus) TENDERNESS IN MOUTH AND THROAT WITH OR WITHOUT PRESENCE OF ULCERS (sore throat, sores in mouth, or a toothache) UNUSUAL RASH, SWELLING OR PAIN  UNUSUAL VAGINAL DISCHARGE OR ITCHING   Items with * indicate a potential emergency and should be followed up as soon as possible or go to the Emergency Department if any problems should occur.  Please show the CHEMOTHERAPY ALERT CARD or IMMUNOTHERAPY ALERT CARD at  check-in to the Emergency Department and triage nurse.  Should you have questions after your visit or need to cancel or reschedule your appointment, please contact [facility]  Dept: [phone]  and follow the prompts.  Office hours are 8:00 a.m. to 4:30 p.m. Monday - Friday. Please note that voicemails left after 4:00 p.m. may not be returned until the following business day.  We are closed weekends and major holidays. You have access to a nurse at all times for urgent questions. Please call the main number to the clinic Dept: [phone] and follow the prompts.   For any non-urgent questions, you may also contact your provider using MyChart. We now offer e-Visits for anyone 21 and older to request care online for non-urgent symptoms. For details visit [email or web address].   Also download the MyChart app! Go to the app store, search "MyChart", open the app, select [facility], and log in with your MyChart username and password.  Masks are optional in the cancer centers. If you would like for your care team to wear a mask while they are taking care of you, please let them know. You may have one support person who is at least 67 years old accompany you for your appointments. Rituximab Injection What is this medication? RITUXIMAB (ri TUX i mab) treats leukemia and lymphoma. It works by blocking a protein that causes cancer cells to grow and multiply. This helps to slow or stop the spread of cancer cells. It may also be used to treat autoimmune conditions, such as arthritis. It works by slowing down an overactive immune system. It is a  monoclonal antibody. This medicine may be used for other purposes; ask your health care provider or pharmacist if you have questions. COMMON BRAND NAME(S): RIABNI, Rituxan, RUXIENCE, truxima What should I tell my care team before I take this medication? They need to know if you have any of these conditions: Chest pain Heart  disease Immune system problems Infection, such as chickenpox, cold sores, hepatitis B, herpes Irregular heartbeat or rhythm Kidney disease Low blood counts, such as low white cells, platelets, red cells Lung disease Recent or upcoming vaccine An unusual or allergic reaction to rituximab, other medications, foods, dyes, or preservatives Pregnant or trying to get pregnant Breast-feeding How should I use this medication? This medication is injected into a vein. It is given by a care team in a hospital or clinic setting. A special MedGuide will be given to you before each treatment. Be sure to read this information carefully each time. Talk to your care team about the use of this medication in children. While this medication may be prescribed for children as young as 6 months for selected conditions, precautions do apply. Overdosage: If you think you have taken too much of this medicine contact a poison control center or emergency room at once. NOTE: This medicine is only for you. Do not share this medicine with others. What if I miss a dose? Keep appointments for follow-up doses. It is important not to miss your dose. Call your care team if you are unable to keep an appointment. What may interact with this medication? Do not take this medication with any of the following: Live vaccines This medication may also interact with the following: Cisplatin This list may not describe all possible interactions. Give your health care provider a list of all the medicines, herbs, non-prescription drugs, or dietary supplements you use. Also tell them if you smoke, drink alcohol, or use illegal drugs. Some items may interact with your medicine. What should I watch for while using this medication? Your condition will be monitored carefully while you are receiving this medication. You may need blood work while taking this medication. This medication can cause serious infusion reactions. To reduce the risk  your care team may give you other medications to take before receiving this one. Be sure to follow the directions from your care team. This medication may increase your risk of getting an infection. Call your care team for advice if you get a fever, chills, sore throat, or other symptoms of a cold or flu. Do not treat yourself. Try to avoid being around people who are sick. Call your care team if you are around anyone with measles, chickenpox, or if you develop sores or blisters that do not heal properly. Avoid taking medications that contain aspirin, acetaminophen, ibuprofen, naproxen, or ketoprofen unless instructed by your care team. These medications may hide a fever. This medication may cause serious skin reactions. They can happen weeks to months after starting the medication. Contact your care team right away if you notice fevers or flu-like symptoms with a rash. The rash may be red or purple and then turn into blisters or peeling of the skin. You may also notice a red rash with swelling of the face, lips, or lymph nodes in your neck or under your arms. In some patients, this medication may cause a serious brain infection that may cause death. If you have any problems seeing, thinking, speaking, walking, or standing, tell your care team right away. If you cannot reach your care team, urgently  seek another source of medical care. Talk to your care team if you may be pregnant. Serious birth defects can occur if you take this medication during pregnancy and for 12 months after the last dose. You will need a negative pregnancy test before starting this medication. Contraception is recommended while taking this medication and for 12 months after the last dose. Your care team can help you find the option that works for you. Do not breastfeed while taking this medication and for at least 6 months after the last dose. What side effects may I notice from receiving this medication? Side effects that you should  report to your care team as soon as possible: Allergic reactions or angioedema--skin rash, itching or hives, swelling of the face, eyes, lips, tongue, arms, or legs, trouble swallowing or breathing Bowel blockage--stomach cramping, unable to have a bowel movement or pass gas, loss of appetite, vomiting Dizziness, loss of balance or coordination, confusion or trouble speaking Heart attack--pain or tightness in the chest, shoulders, arms, or jaw, nausea, shortness of breath, cold or clammy skin, feeling faint or lightheaded Heart rhythm changes--fast or irregular heartbeat, dizziness, feeling faint or lightheaded, chest pain, trouble breathing Infection--fever, chills, cough, sore throat, wounds that don't heal, pain or trouble when passing urine, general feeling of discomfort or being unwell Infusion reactions--chest pain, shortness of breath or trouble breathing, feeling faint or lightheaded Kidney injury--decrease in the amount of urine, swelling of the ankles, hands, or feet Liver injury--right upper belly pain, loss of appetite, nausea, light-colored stool, dark yellow or brown urine, yellowing skin or eyes, unusual weakness or fatigue Redness, blistering, peeling, or loosening of the skin, including inside the mouth Stomach pain that is severe, does not go away, or gets worse Tumor lysis syndrome (TLS)--nausea, vomiting, diarrhea, decrease in the amount of urine, dark urine, unusual weakness or fatigue, confusion, muscle pain or cramps, fast or irregular heartbeat, joint pain Side effects that usually do not require medical attention (report to your care team if they continue or are bothersome): Headache Joint pain Nausea Runny or stuffy nose Unusual weakness or fatigue This list may not describe all possible side effects. Call your doctor for medical advice about side effects. You may report side effects to FDA at 1-800-FDA-1088. Where should I keep my medication? This medication is given  in a hospital or clinic. It will not be stored at home. NOTE: This sheet is a summary. It may not cover all possible information. If you have questions about this medicine, talk to your doctor, pharmacist, or health care provider.  2023 Elsevier/Gold Standard (2021-05-06 00:00:00)

## 2021-12-09 ENCOUNTER — Encounter: Payer: Self-pay | Admitting: Hematology and Oncology

## 2021-12-15 ENCOUNTER — Inpatient Hospital Stay: Payer: Medicare HMO

## 2021-12-15 VITALS — BP 121/72 | HR 64 | Temp 97.7°F | Resp 18 | Wt 232.8 lb

## 2021-12-15 DIAGNOSIS — C884 Extranodal marginal zone B-cell lymphoma of mucosa-associated lymphoid tissue [MALT-lymphoma]: Secondary | ICD-10-CM | POA: Diagnosis not present

## 2021-12-15 DIAGNOSIS — C8580 Other specified types of non-Hodgkin lymphoma, unspecified site: Secondary | ICD-10-CM

## 2021-12-15 DIAGNOSIS — Z5112 Encounter for antineoplastic immunotherapy: Secondary | ICD-10-CM | POA: Diagnosis not present

## 2021-12-15 DIAGNOSIS — E119 Type 2 diabetes mellitus without complications: Secondary | ICD-10-CM | POA: Diagnosis not present

## 2021-12-15 DIAGNOSIS — C61 Malignant neoplasm of prostate: Secondary | ICD-10-CM | POA: Diagnosis not present

## 2021-12-15 MED ORDER — ACETAMINOPHEN 325 MG PO TABS
650.0000 mg | ORAL_TABLET | Freq: Once | ORAL | Status: AC
  Administered 2021-12-15: 650 mg via ORAL
  Filled 2021-12-15: qty 2

## 2021-12-15 MED ORDER — SODIUM CHLORIDE 0.9 % IV SOLN
375.0000 mg/m2 | Freq: Once | INTRAVENOUS | Status: AC
  Administered 2021-12-15: 800 mg via INTRAVENOUS
  Filled 2021-12-15: qty 30

## 2021-12-15 MED ORDER — SODIUM CHLORIDE 0.9 % IV SOLN: Freq: Once | INTRAVENOUS | Status: AC

## 2021-12-15 MED ORDER — CETIRIZINE HCL 10 MG/ML IV SOLN
10.0000 mg | Freq: Once | INTRAVENOUS | Status: AC
  Administered 2021-12-15: 10 mg via INTRAVENOUS
  Filled 2021-12-15: qty 1

## 2021-12-15 NOTE — Patient Instructions (Signed)
[facility]ICAL [facility]COL[facility]  Discharge Instructions: Thank you for choosing [facility] to provide your oncology and hematology care.   If you have a lab appointment with the Cancer Center, please go directly to the Cancer Center and check in at the registration area.   Wear comfortable clothing and clothing appropriate for easy access to any Portacath or PICC line.   We strive to give you quality time with your provider. You may need to reschedule your appointment if you arrive late (15 or more minutes).  Arriving late affects you and other patients whose appointments are after yours.  Also, if you miss three or more appointments without notifying the office, you may be dismissed from the clinic at the provider's discretion.      For prescription refill requests, have your pharmacy contact our office and allow 72 hours for refills to be completed.    Today you received the following chemotherapy and/or immunotherapy agents : Rituximab      To help prevent nausea and vomiting after your treatment, we encourage you to take your nausea medication as directed.  BELOW ARE SYMPTOMS THAT SHOULD BE REPORTED IMMEDIATELY: *FEVER GREATER THAN 100.4 F (38 C) OR HIGHER *CHILLS OR SWEATING *NAUSEA AND VOMITING THAT IS NOT CONTROLLED WITH YOUR NAUSEA MEDICATION *UNUSUAL SHORTNESS OF BREATH *UNUSUAL BRUISING OR BLEEDING *URINARY PROBLEMS (pain or burning when urinating, or frequent urination) *BOWEL PROBLEMS (unusual diarrhea, constipation, pain near the anus) TENDERNESS IN MOUTH AND THROAT WITH OR WITHOUT PRESENCE OF ULCERS (sore throat, sores in mouth, or a toothache) UNUSUAL RASH, SWELLING OR PAIN  UNUSUAL VAGINAL DISCHARGE OR ITCHING   Items with * indicate a potential emergency and should be followed up as soon as possible or go to the Emergency Department if any problems should occur.  Please show the CHEMOTHERAPY ALERT CARD or IMMUNOTHERAPY ALERT CARD at check-in to  the Emergency Department and triage nurse.  Should you have questions after your visit or need to cancel or reschedule your appointment, please contact [facility]  Dept: [phone]  and follow the prompts.  Office hours are 8:00 a.m. to 4:30 p.m. Monday - Friday. Please note that voicemails left after 4:00 p.m. may not be returned until the following business day.  We are closed weekends and major holidays. You have access to a nurse at all times for urgent questions. Please call the main number to the clinic Dept: [phone] and follow the prompts.   For any non-urgent questions, you may also contact your provider using MyChart. We now offer e-Visits for anyone 18 and older to request care online for non-urgent symptoms. For details visit [email or web address].   Also download the MyChart app! Go to the app store, search "MyChart", open the app, select [facility], and log in with your MyChart username and password.  Masks are optional in the cancer centers. If you would like for your care team to wear a mask while they are taking care of you, please let them know. You may have one support person who is at least 67 years old accompany you for your appointments. 

## 2021-12-16 ENCOUNTER — Other Ambulatory Visit: Payer: Self-pay

## 2022-01-09 DIAGNOSIS — R69 Illness, unspecified: Secondary | ICD-10-CM | POA: Diagnosis not present

## 2022-01-14 ENCOUNTER — Ambulatory Visit (INDEPENDENT_AMBULATORY_CARE_PROVIDER_SITE_OTHER): Payer: Medicare HMO | Admitting: Family Medicine

## 2022-01-14 ENCOUNTER — Encounter: Payer: Self-pay | Admitting: Family Medicine

## 2022-01-14 VITALS — BP 120/78 | HR 69 | Temp 98.0°F | Ht 70.0 in | Wt 230.2 lb

## 2022-01-14 DIAGNOSIS — I10 Essential (primary) hypertension: Secondary | ICD-10-CM | POA: Diagnosis not present

## 2022-01-14 DIAGNOSIS — E119 Type 2 diabetes mellitus without complications: Secondary | ICD-10-CM | POA: Diagnosis not present

## 2022-01-14 LAB — POCT GLYCOSYLATED HEMOGLOBIN (HGB A1C): Hemoglobin A1C: 8.5 % — AB (ref 4.0–5.6)

## 2022-01-14 MED ORDER — LOSARTAN POTASSIUM 25 MG PO TABS: 25.0000 mg | ORAL_TABLET | Freq: Every day | ORAL | 5 refills | Status: DC

## 2022-01-14 MED ORDER — METFORMIN HCL ER 500 MG PO TB24: 1000.0000 mg | ORAL_TABLET | Freq: Every day | ORAL | 5 refills | Status: DC

## 2022-01-14 NOTE — Progress Notes (Unsigned)
Charles Bacorn T. Carlyon Nolasco, MD, [place] at [facility] [place], [address]  Phone: [phone]  FAX: [place] - 67 y.o. male  MRN [id number]  Date of Birth: November 07, 1955  Date: 01/14/2022  PCP: Owens Loffler, MD  Referral: Owens Loffler, MD  Chief Complaint  Patient presents with   Diabetes   Subjective:   Charles [name] is a 67 y.o. very pleasant male patient with Body mass index is 33.04 kg/m. who presents with the following:  Charles [name] is here for diabetes follow-up.  3/11 is the upcoming PET scan.   HTN:   120/78 -He brings a wealth of blood pressures in the office, many of which are in the 299M and 426S systolic and some of them in the 34H diastolic.  He is here with his wife who is also a Data processing manager.  They have many different blood pressure readings.  Start metformin and losartan  Diabetes Mellitus: Tolerating Medications: yes Compliance with diet: fair, Body mass index is 33.04 kg/m. Exercise: minimal / intermittent Avg blood sugars at home: not checking Foot problems: none Hypoglycemia: none No nausea, vomitting, blurred vision, polyuria.  Lab Results  Component Value Date   HGBA1C 8.5 (A) 01/14/2022   HGBA1C 7.2 (A) 07/14/2021   HGBA1C 7.9 (H) 04/02/2021   Lab Results  Component Value Date   MICROALBUR <0.7 04/02/2021   LDLCALC 81 04/02/2021   CREATININE 1.17 11/21/2021    Wt Readings from Last 3 Encounters:  01/14/22 230 lb 4 oz (104.4 kg)  12/15/21 232 lb 12 oz (105.6 kg)  12/08/21 233 lb 4 oz (105.8 kg)    Lipids: Doing well, stable. Tolerating meds fine with no SE. he is on Lipitor 80 mg. Panel reviewed with patient.  Lipids: Lab Results  Component Value Date   CHOL 142 04/02/2021   Lab Results  Component Value Date   HDL 45.30 04/02/2021   Lab Results  Component Value Date   LDLCALC 81 04/02/2021   Lab Results  Component Value Date   TRIG 82.0  04/02/2021   Lab Results  Component Value Date   CHOLHDL 3 04/02/2021    Lab Results  Component Value Date   ALT 34 11/21/2021   AST 24 11/21/2021   ALKPHOS 92 11/21/2021   BILITOT 0.8 11/21/2021     Review of Systems is noted in the HPI, as appropriate  Objective:   BP 120/78   Pulse 69   Temp 98 F (36.7 C) (Oral)   Ht '5\' 10"'$  (1.778 m)   Wt 230 lb 4 oz (104.4 kg)   SpO2 96%   BMI 33.04 kg/m   GEN: No acute distress; alert,appropriate. PULM: Breathing comfortably in no respiratory distress PSYCH: Normally interactive.  CV: RRR, no m/g/r   Laboratory and Imaging Data: Results for orders placed or performed in visit on 01/14/22  POCT glycosylated hemoglobin (Hb A1C)  Result Value Ref Range   Hemoglobin A1C 8.5 (A) 4.0 - 5.6 %   HbA1c POC (<> result, manual entry)     HbA1c, POC (prediabetic range)     HbA1c, POC (controlled diabetic range)       Assessment and Plan:     ICD-10-CM   1. Controlled type 2 diabetes mellitus without complication, without long-term current use of insulin (HCC)  E11.9 POCT glycosylated hemoglobin (Hb A1C)    2. Hypertension, essential  I10      Blood sugars  continue to deteriorate.  He has gained about 10 pounds in the last few months.  In the past, he has not wanted to start any diabetic medication, but I think he really has to at this point.  Will start him on metformin with a recheck in 3 months at his general physical.  Based on the readings that his wife and he bring in, his blood pressure is normally on the high side.  Also, given his diabetes, I am going to place him on angiotensin receptor blocker.  Medication Management during today's office visit: Meds ordered this encounter  Medications   metFORMIN (GLUCOPHAGE-XR) 500 MG 24 hr tablet    Sig: Take 2 tablets (1,000 mg total) by mouth daily with breakfast.    Dispense:  60 tablet    Refill:  5   losartan (COZAAR) 25 MG tablet    Sig: Take 1 tablet (25 mg total) by mouth  daily.    Dispense:  30 tablet    Refill:  5   There are no discontinued medications.  Orders placed today for conditions managed today: Orders Placed This Encounter  Procedures   POCT glycosylated hemoglobin (Hb A1C)    Disposition: Return in about 3 months (around 04/15/2022) for follow-up with Dr. Lorelei Pont diabetes and high blood pressure.  Dr[employer] One speech-to-text software was used for transcription in this dictation.  Possible transcriptional errors can occur using Editor, commissioning.   Signed,  Maud Deed. Emelie Newsom, MD   Outpatient Encounter Medications as of 01/14/2022  Medication Sig   Alcohol Swabs (B-D SINGLE USE SWABS REGULAR) PADS Use to check blood sugar up to 2 times a day   aspirin EC 81 MG tablet Take 81 mg by mouth daily.   atorvastatin (LIPITOR) 80 MG tablet Take 1 tablet (80 mg total) by mouth daily.   Cetirizine HCl 10 MG CAPS    cholecalciferol (VITAMIN D3) 25 MCG (1000 UNIT) tablet Take 2,000 Units by mouth daily.   CLINPRO 5000 1.1 % PSTE Place onto teeth daily.   Flaxseed, Linseed, (FLAX SEED OIL PO) Take by mouth.   FLUoxetine (PROZAC) 20 MG capsule Take 1 capsule (20 mg total) by mouth daily.   gabapentin (NEURONTIN) 300 MG capsule Take 1 capsule (300 mg total) by mouth at bedtime.   glucose blood (ONETOUCH VERIO) test strip Use to check blood sugar up to 2 times a day.   Lancets (ONETOUCH DELICA PLUS [id number]) MISC Use to check blood sugar up to 2 times a day.   LORazepam (ATIVAN) 0.5 MG tablet Take 1 tablet (0.5 mg total) by mouth every 8 (eight) hours as needed. for anxiety   losartan (COZAAR) 25 MG tablet Take 1 tablet (25 mg total) by mouth daily.   meloxicam (MOBIC) 15 MG tablet TAKE 1 TABLET (15 MG TOTAL) BY MOUTH DAILY.   metFORMIN (GLUCOPHAGE-XR) 500 MG 24 hr tablet Take 2 tablets (1,000 mg total) by mouth daily with breakfast.   Multiple Vitamins-Minerals (MULTIVITAMIN WITH MINERALS) tablet Take 1 tablet by mouth daily.   sildenafil  (VIAGRA) 100 MG tablet Take 0.5-1 tablets (50-100 mg total) by mouth daily as needed for erectile dysfunction.   No facility-administered encounter medications on file as of 01/14/2022.

## 2022-01-15 ENCOUNTER — Encounter: Payer: Self-pay | Admitting: Family Medicine

## 2022-01-15 DIAGNOSIS — I10 Essential (primary) hypertension: Secondary | ICD-10-CM | POA: Insufficient documentation

## 2022-01-26 ENCOUNTER — Other Ambulatory Visit: Payer: Self-pay | Admitting: Family Medicine

## 2022-02-26 ENCOUNTER — Other Ambulatory Visit: Payer: Self-pay | Admitting: Family Medicine

## 2022-02-26 ENCOUNTER — Telehealth: Payer: Self-pay | Admitting: Family Medicine

## 2022-02-26 NOTE — Telephone Encounter (Signed)
Last office visit 01/14/22 for DM and HTN.  Last refilled 10/09/21 for #30 with 1 refill.  Next Appt: CPE 04/09/22.

## 2022-02-26 NOTE — Telephone Encounter (Signed)
Called patient to schedule Medicare Annual Wellness Visit (AWV). Left message for patient to call back and schedule Medicare Annual Wellness Visit (AWV).  Last date of AWV: 04/03/2022  Please schedule an appointment at any time with [facility].  If any questions, please contact me at [phone].  Thank you ,  [place] Direct Dial: [phone]

## 2022-03-16 ENCOUNTER — Encounter: Payer: Self-pay | Admitting: Neurology

## 2022-03-16 ENCOUNTER — Ambulatory Visit: Payer: Medicare HMO | Admitting: Neurology

## 2022-03-16 ENCOUNTER — Encounter (HOSPITAL_COMMUNITY)
Admission: RE | Admit: 2022-03-16 | Discharge: 2022-03-16 | Disposition: A | Discharge status: Home / Self Care | Payer: Medicare HMO | Source: Ambulatory Visit | Attending: Hematology and Oncology | Admitting: Hematology and Oncology

## 2022-03-16 VITALS — BP 152/85 | HR 67 | Ht 70.0 in | Wt 225.0 lb

## 2022-03-16 DIAGNOSIS — E1142 Type 2 diabetes mellitus with diabetic polyneuropathy: Secondary | ICD-10-CM | POA: Diagnosis not present

## 2022-03-16 DIAGNOSIS — G621 Alcoholic polyneuropathy: Secondary | ICD-10-CM | POA: Diagnosis not present

## 2022-03-16 DIAGNOSIS — C8580 Other specified types of non-Hodgkin lymphoma, unspecified site: Secondary | ICD-10-CM | POA: Diagnosis not present

## 2022-03-16 DIAGNOSIS — I700 Atherosclerosis of aorta: Secondary | ICD-10-CM | POA: Diagnosis not present

## 2022-03-16 DIAGNOSIS — R69 Illness, unspecified: Secondary | ICD-10-CM | POA: Diagnosis not present

## 2022-03-16 LAB — GLUCOSE, CAPILLARY: Glucose-Capillary: 157 mg/dL — ABNORMAL HIGH (ref 70–99)

## 2022-03-16 MED ORDER — FLUDEOXYGLUCOSE F - 18 (FDG) INJECTION
11.5000 | Freq: Once | INTRAVENOUS | Status: AC | PRN
  Administered 2022-03-16: 11.43 via INTRAVENOUS

## 2022-03-16 MED ORDER — GABAPENTIN 300 MG PO CAPS: 600.0000 mg | ORAL_CAPSULE | Freq: Every day | ORAL | 1 refills | Status: DC

## 2022-03-16 NOTE — Patient Instructions (Addendum)
Increase gabapentin to '600mg'$  at bedtime  Send my a MyChart message in 1 month with an update of how you are doing on the medication  I will see you back in the office in 4 months

## 2022-03-16 NOTE — Progress Notes (Signed)
Follow-up Visit   Date: 03/16/2022    Charles [name] MRN: [id number] DOB: Nov 25, 1955    Charles [name] is a 67 y.o. right-handed Caucasian male with diet controlled diabetes mellitus, anxiety, GERD, prostate cancer, marginal zone B cell lymphoma, and history of alcohol use  returning to the clinic for follow-up of neuropathy.  The patient was accompanied to the clinic by wife (retired Marine scientist) who also provides collateral information.    IMPRESSION/PLAN: Peripheral neuropathy manifesting with painful paresthesias of the feet.  Risk factors:  diabetes, history of alcohol abuse.  Labs for secondary causes of neuropathy were reviewed and normal.   - Increase gabapentin to '600mg'$  at bedtime  - Mychart update in 1 month to determine further titration  Return to clinic in 4  --------------------------------------------- History of present illness: Starting about 12 years ago, he began having burning, stinging, and tingling in the feet and distal weakness. Symptoms are constant and worse in the evening when he tries to rest.  Nothing has worsened symptoms.  He has tried a foot massage which provides some relief.  He has noticed that his toes are harder to move, because they feel stuck.  Balance is good, although he had one fall today in the shower while trying to practice his golf swing.  He did not hurt himself.     He had diabetes for the past 3 years, which is diet controlled.  For about 25 years, until the mid 2000s, he was drinking excessively, sometimes 6 pack of beer nightly.  He has lost his drivers license on several occasions because of alcohol abuse. He is currently sober. No exposure to chemotherapy  UPDATE 03/16/2022:  He is here for follow-up visit.  He was started on gabapentin '300mg'$  at the last visit, however he has not noticed any improvement in the burning pain in the feet.  Pain is always worse in the evening.  He denies side effects. No new weakness or imbalance.   He had  PET scan today to assess response to chemotherapy for B cell lymphoma. He is understandably nervous about this.    Medications:  Current Outpatient Medications on File Prior to Visit  Medication Sig Dispense Refill   Alcohol Swabs (B-D SINGLE USE SWABS REGULAR) PADS Use to check blood sugar up to 2 times a day 100 each 5   aspirin EC 81 MG tablet Take 81 mg by mouth daily.     atorvastatin (LIPITOR) 80 MG tablet Take 1 tablet (80 mg total) by mouth daily. 90 tablet 3   Cetirizine HCl 10 MG CAPS      cholecalciferol (VITAMIN D3) 25 MCG (1000 UNIT) tablet Take 2,000 Units by mouth daily.     CLINPRO 5000 1.1 % PSTE Place onto teeth daily.     Flaxseed, Linseed, (FLAX SEED OIL PO) Take by mouth.     FLUoxetine (PROZAC) 20 MG capsule TAKE 1 CAPSULE BY MOUTH EVERY DAY 30 capsule 5   gabapentin (NEURONTIN) 300 MG capsule Take 1 capsule (300 mg total) by mouth at bedtime. 30 capsule 5   glucose blood (ONETOUCH VERIO) test strip Use to check blood sugar up to 2 times a day. 100 each 5   Lancets (ONETOUCH DELICA PLUS [id number]) MISC Use to check blood sugar up to 2 times a day. 100 each 5   LORazepam (ATIVAN) 0.5 MG tablet TAKE 1 TABLET (0.5 MG TOTAL) BY MOUTH EVERY 8 (EIGHT) HOURS AS NEEDED. FOR ANXIETY 30 tablet 1  losartan (COZAAR) 25 MG tablet Take 1 tablet (25 mg total) by mouth daily. 30 tablet 5   meloxicam (MOBIC) 15 MG tablet TAKE 1 TABLET (15 MG TOTAL) BY MOUTH DAILY. 30 tablet 5   metFORMIN (GLUCOPHAGE-XR) 500 MG 24 hr tablet Take 2 tablets (1,000 mg total) by mouth daily with breakfast. 60 tablet 5   Multiple Vitamins-Minerals (MULTIVITAMIN WITH MINERALS) tablet Take 1 tablet by mouth daily.     sildenafil (VIAGRA) 100 MG tablet Take 0.5-1 tablets (50-100 mg total) by mouth daily as needed for erectile dysfunction. 5 tablet 11   No current facility-administered medications on file prior to visit.    Allergies: No Known Allergies  Vital Signs:  BP (!) 143/87 (BP Location: Right Arm)    Pulse 67   Ht '5\' 10"'$  (1.778 m)   Wt 225 lb (102.1 kg)   SpO2 96%   BMI 32.28 kg/m    Neurological Exam: MENTAL STATUS including orientation to time, place, person, recent and remote memory, attention span and concentration, language, and fund of knowledge is normal.  Speech is not dysarthric.  CRANIAL NERVES: Pupils equal round and reactive to light.  Normal conjugate, extra-ocular eye movements in all directions of gaze.  No ptosis .  Face is symmetric.   MOTOR:  Motor strength is 5/5 in all extremities, .  No atrophy, fasciculations or abnormal movements.  No pronator drift.  Tone is normal.    MSRs:  Reflexes are 2+/4 throughout.  SENSORY:  Intact to vibration at the ankles.  COORDINATION/GAIT:  Normal finger-to- nose-finger.  Gait narrow based and stable.   Data:  Lab Results  Component Value Date   [id number] 11/14/2021   Lab Results  Component Value Date   HGBA1C 8.5 (A) 01/14/2022     Thank you for allowing me to participate in patient's care.  If I can answer any additional questions, I would be pleased to do so.    Sincerely,    Carmyn Hamm K. Posey Pronto, DO

## 2022-03-19 ENCOUNTER — Inpatient Hospital Stay: Payer: Medicare HMO | Attending: Hematology and Oncology | Admitting: Hematology and Oncology

## 2022-03-19 VITALS — BP 138/80 | HR 63 | Temp 97.6°F | Resp 18 | Ht 70.0 in | Wt 223.2 lb

## 2022-03-19 DIAGNOSIS — C8580 Other specified types of non-Hodgkin lymphoma, unspecified site: Secondary | ICD-10-CM | POA: Diagnosis not present

## 2022-03-19 DIAGNOSIS — I700 Atherosclerosis of aorta: Secondary | ICD-10-CM | POA: Insufficient documentation

## 2022-03-19 DIAGNOSIS — R918 Other nonspecific abnormal finding of lung field: Secondary | ICD-10-CM | POA: Diagnosis not present

## 2022-03-19 DIAGNOSIS — C8300 Small cell B-cell lymphoma, unspecified site: Secondary | ICD-10-CM | POA: Insufficient documentation

## 2022-03-19 DIAGNOSIS — E119 Type 2 diabetes mellitus without complications: Secondary | ICD-10-CM | POA: Diagnosis not present

## 2022-03-19 DIAGNOSIS — J329 Chronic sinusitis, unspecified: Secondary | ICD-10-CM

## 2022-03-19 DIAGNOSIS — C61 Malignant neoplasm of prostate: Secondary | ICD-10-CM | POA: Insufficient documentation

## 2022-03-20 ENCOUNTER — Encounter: Payer: Self-pay | Admitting: Hematology and Oncology

## 2022-03-20 ENCOUNTER — Encounter: Payer: Self-pay | Admitting: Family Medicine

## 2022-03-20 DIAGNOSIS — J329 Chronic sinusitis, unspecified: Secondary | ICD-10-CM | POA: Insufficient documentation

## 2022-03-20 NOTE — Assessment & Plan Note (Signed)
He has significant sinus congestion and difficulties with breathing especially at nighttime PET/CT imaging shows significant sinus congestion The patient requested referral to a different ear nose and throat doctor for further management

## 2022-03-20 NOTE — Assessment & Plan Note (Signed)
I spent a lot of time reviewing PET/CT imaging with the patient and his wife I do not believe the activity seen on PET/CT imaging is related to persistent disease Per radiologist, it is likely caused by effects by metformin I will reach out to her gastroenterologist for consideration for repeat colonoscopy in the near future I will see him back in 6 months for further follow-up

## 2022-03-20 NOTE — Assessment & Plan Note (Signed)
We discussed association between obesity with risk of lymphoma and prostate cancer recurrence The patient appears motivated to continue on aggressive dietary modification and exercise program in an attempt to lose some weight

## 2022-03-20 NOTE — Progress Notes (Signed)
[facility] OFFICE PROGRESS NOTE  Patient Care Team: Owens Loffler, MD as PCP - General (Family Medicine) Alda Berthold, DO as Consulting Physician (Neurology)  ASSESSMENT & PLAN:  Marginal zone B-cell lymphoma ([place]) I spent a lot of time reviewing PET/CT imaging with the patient and his wife I do not believe the activity seen on PET/CT imaging is related to persistent disease Per radiologist, it is likely caused by effects by metformin I will reach out to her gastroenterologist for consideration for repeat colonoscopy in the near future I will see him back in 6 months for further follow-up  Controlled type 2 diabetes mellitus without complication, without long-term current use of insulin ([place]) We discussed association between obesity with risk of lymphoma and prostate cancer recurrence The patient appears motivated to continue on aggressive dietary modification and exercise program in an attempt to lose some weight   Chronic sinusitis He has significant sinus congestion and difficulties with breathing especially at nighttime PET/CT imaging shows significant sinus congestion The patient requested referral to a different ear nose and throat doctor for further management  Orders Placed This Encounter  Procedures   CBC with Differential/Platelet    Standing Status:   Standing    Number of Occurrences:   22    Standing Expiration Date:   03/20/2023   Comprehensive metabolic panel    Standing Status:   Standing    Number of Occurrences:   33    Standing Expiration Date:   03/20/2023   Ambulatory referral to ENT    Referral Priority:   Routine    Referral Type:   Consultation    Referral Reason:   Specialty Services Required    Referred to Provider:   Leta Baptist, MD    Requested Specialty:   Otolaryngology    Number of Visits Requested:   1    All questions were answered. The patient knows to call the clinic with any problems, questions or concerns. The total  time spent in the appointment was 30 minutes encounter with patients including review of chart and various tests results, discussions about plan of care and coordination of care plan   [name], MD 03/20/2022 11:56 AM  INTERVAL HISTORY: Please see below for problem oriented charting. he returns for review test results He complains of sinus congestion He denies recent abdominal pain He has chronic intermittent loose stool that is unchanged  REVIEW OF SYSTEMS:   Constitutional: Denies fevers, chills or abnormal weight loss Eyes: Denies blurriness of vision Ears, nose, mouth, throat, and face: Denies mucositis or sore throat Respiratory: Denies cough, dyspnea or wheezes Cardiovascular: Denies palpitation, chest discomfort or lower extremity swelling Gastrointestinal:  Denies nausea, heartburn or change in bowel habits Skin: Denies abnormal skin rashes Lymphatics: Denies new lymphadenopathy or easy bruising Neurological:Denies numbness, tingling or new weaknesses Behavioral/Psych: Mood is stable, no new changes  All other systems were reviewed with the patient and are negative.  I have reviewed the past medical history, past surgical history, social history and family history with the patient and they are unchanged from previous note.  ALLERGIES:  has No Known Allergies.  MEDICATIONS:  Current Outpatient Medications  Medication Sig Dispense Refill   Alcohol Swabs (B-D SINGLE USE SWABS REGULAR) PADS Use to check blood sugar up to 2 times a day 100 each 5   aspirin EC 81 MG tablet Take 81 mg by mouth daily.     atorvastatin (LIPITOR) 80 MG tablet Take 1 tablet (  80 mg total) by mouth daily. 90 tablet 3   Cetirizine HCl 10 MG CAPS      cholecalciferol (VITAMIN D3) 25 MCG (1000 UNIT) tablet Take 2,000 Units by mouth daily.     CLINPRO 5000 1.1 % PSTE Place onto teeth daily.     Flaxseed, Linseed, (FLAX SEED OIL PO) Take by mouth.     FLUoxetine (PROZAC) 20 MG capsule TAKE 1 CAPSULE BY  MOUTH EVERY DAY 30 capsule 5   gabapentin (NEURONTIN) 300 MG capsule Take 2 capsules (600 mg total) by mouth at bedtime. 180 capsule 1   glucose blood (ONETOUCH VERIO) test strip Use to check blood sugar up to 2 times a day. 100 each 5   Lancets (ONETOUCH DELICA PLUS Q000111Q) MISC Use to check blood sugar up to 2 times a day. 100 each 5   LORazepam (ATIVAN) 0.5 MG tablet TAKE 1 TABLET (0.5 MG TOTAL) BY MOUTH EVERY 8 (EIGHT) HOURS AS NEEDED. FOR ANXIETY 30 tablet 1   losartan (COZAAR) 25 MG tablet Take 1 tablet (25 mg total) by mouth daily. 30 tablet 5   meloxicam (MOBIC) 15 MG tablet TAKE 1 TABLET (15 MG TOTAL) BY MOUTH DAILY. 30 tablet 5   metFORMIN (GLUCOPHAGE-XR) 500 MG 24 hr tablet Take 2 tablets (1,000 mg total) by mouth daily with breakfast. 60 tablet 5   Multiple Vitamins-Minerals (MULTIVITAMIN WITH MINERALS) tablet Take 1 tablet by mouth daily.     sildenafil (VIAGRA) 100 MG tablet Take 0.5-1 tablets (50-100 mg total) by mouth daily as needed for erectile dysfunction. 5 tablet 11   No current facility-administered medications for this visit.    SUMMARY OF ONCOLOGIC HISTORY: Oncology History  Marginal zone B-cell lymphoma ([place])  05/21/2020 Procedure   Colonoscopy by Dr. Hilarie Fredrickson - One 4 mm polyp in the ascending colon, removed with a cold snare. Resected and retrieved. - One 25 mm polyp in the proximal transverse colon, removed piecemeal using a cold snare. Resected and retrieved. Tattooed. - Diverticulosis from cecum to sigmoid colon. - The distal rectum and anal verge are normal on retroflexion view.   05/21/2020 Pathology Results   1. Surgical [P], colon, ascending, polyp (1) - TUBULAR ADENOMA. - NO HIGH GRADE DYSPLASIA OR CARCINOMA. 2. Surgical [P], colon, transverse, polyp (1) - ATYPICAL LYMPHOID PROLIFERATION. - SEE MICROSCOPIC DESCRIPTION. Microscopic Comment 2. The sections show a dense, diffuse and relatively monomorphic lymphoid infiltrate involving the lamina propria  and submucosa and mostly characterized by small to medium sized lymphocytes with round to slightly irregular nuclei and moderately abundant clear cytoplasm imparting a monocytoid appearance. The infiltrate displays scattered small foci of lymphoepithelial lesions and occasionally surround very small "naked" germinal centers. A battery of immunohistochemical stains was performed and shows that the lymphoid infiltrate is predominantly composed of B cells as seen with CD20 associated with diffuse positivity for Bcl-2 and CD43. No significant CD10 or cyclin D1 positivity is identified. BCL6 highlights scattering of small positive clusters correlating with previously described germinal centers. Ki-67 shows very low expression (less than 10%). In situ hybridization for kappa and lambda highlight a polyclonal superficial plasma cell component. The findings are highly suspicious for involvement by low-grade B-cell lymphoma particularly extranodal marginal zone lymphoma. FISH and gene rearrangement study studies will be performed and the results reported in an addendum. Dr. Gari Crown reviewed this part and cocnurs. Claudette Laws MD Pathologist, Electronic Signature (Case signed 05/27/2020)    06/24/2020 PET scan   No signs of nodal disease in the  neck, chest, abdomen or pelvis.   Focal area of uptake in the LEFT hemi prostate, nonspecific but raising the question of prostate neoplasm. Urologic consultation is suggested for further evaluation.   Aortic atherosclerosis and coronary artery disease.   Maxillary sinus disease.   Aortic Atherosclerosis (ICD10-I70.0).   06/24/2020 Cancer Staging   Staging form: Hodgkin and Non-Hodgkin Lymphoma, AJCC 8th Edition - Clinical stage from 06/24/2020: Stage IE (Marginal zone lymphoma) - Signed by [name], MD on 06/24/2020 Stage prefix: Initial diagnosis   09/30/2021 Procedure   - The examined portion of the ileum was normal. - One 1 mm polyp in the cecum, removed with a  cold biopsy forceps. Resected and retrieved. - Post-polypectomy scar with distal tattoo in the proximal transverse colon. No evidence of residual polyp/tumor. Biopsied. - One 4 mm polyp in the transverse colon, removed with a cold snare. Complete resection. Polyp tissue not retrieved. - Moderate diverticulosis from ascending colon to sigmoid colon. - The distal rectum and anal verge are normal on retroflexion view.   09/30/2021 Pathology Results   Diagnosis 1. Surgical [P], colon, cecum, polyp (1) - POLYPOID FRAGMENT OF BENIGN COLONIC MUCOSA WITH NO SIGNIFICANT PATHOLOGIC CHANGES 2. Surgical [P], proximal transverse colon scar - ATYPICAL LYMPHOID INFILTRATE (SEE NOTE) Diagnosis Note 2. - Morphologic evaluation reveals colonic mucosa with a single prominent lymphoid aggregate, and no lymphoepithelial lesions. The cells are positive for CD20, CD43 and negative for CD3. Definitive clonality cannot be established given the lack of kappa and lambda ISH staining. Normal colonic mucosa is seen elsewhere. The findings are not diagnostic of, however in the given clinical context of the patient's history of extranodal MALT lymphoma, are suspicious for involvement. Dr. Arby Barrette has reviewed the case and agrees with the interpretation. Clinical and radiologic correlation is recommended.   10/31/2021 PET scan   1. No hypermetabolic adenopathy above or below the diaphragm. 2. No splenomegaly or evidence of osseous lymphomatous involvement 3. Increased short segment uptake in the sigmoid colon along an area of diverticulosis with some associated wall thickening but no adjacent inflammation. While this may reflect physiologic activity versus Segmental colitis associated with diverticulosis, an underlying colonic mass is a differential consideration not excluded on this study, recommend correlation with history of colon cancer screening and colonoscopy if clinically indicated. 4.  Aortic Atherosclerosis  (ICD10-I70.0).   11/24/2021 - 12/15/2021 Chemotherapy   Patient is on Treatment Plan : NON-HODGKINS LYMPHOMA Rituximab q7d     03/16/2022 PET scan   1. No convincing evidence of metabolically active lymphomatous disease. 2. Mildly metabolic opacification of the left maxillary sinus and mucosal thickening of the right maxillary sinus, suggestive of sinusitis. 3. Similar right-sided predominant heterogeneous low-level FDG avidity in an enlarged prostate, component of which may reflect patient's known prostate carcinoma. 4. Stable tiny scattered bilateral pulmonary nodules stable over multiple prior examinations and non FDG avid suggestive of a benign etiology. 5. Diffuse colonic FDG avidity is commonly physiologic/medication-related (metformin). 6.  Aortic Atherosclerosis (ICD10-I70.0).   Prostate cancer ([place])  04/02/2021 Initial Diagnosis   Prostate cancer ([place])   06/24/2021 Cancer Staging   Staging form: Prostate, AJCC 8th Edition - Clinical stage from 06/24/2021: cT1, cN0, cM0, Grade Group: 1 - Signed by [name], MD on 06/24/2021 Stage prefix: Initial diagnosis Histologic grading system: 5 grade system     PHYSICAL EXAMINATION: ECOG PERFORMANCE STATUS: 1 - Symptomatic but completely ambulatory  Vitals:   03/19/22 1047  BP: 138/80  Pulse: 63  Resp: 18  Temp: 97.6 F (36.4 C)  SpO2: 97%   Filed Weights   03/19/22 1047  Weight: 223 lb 3.2 oz (101.2 kg)    GENERAL:alert, no distress and comfortable NEURO: alert & oriented x 3 with fluent speech, no focal motor/sensory deficits  LABORATORY DATA:  I have reviewed the data as listed    Component Value Date/Time   NA 137 11/21/2021 1422   NA 139 01/11/2020 1249   K 4.1 11/21/2021 1422   CL 103 11/21/2021 1422   CO2 29 11/21/2021 1422   GLUCOSE 261 (H) 11/21/2021 1422   BUN 15 11/21/2021 1422   BUN 14 01/11/2020 1249   CREATININE 1.17 11/21/2021 1422   CALCIUM 9.3 11/21/2021 1422   PROT 7.1 11/21/2021 1422   ALBUMIN  4.2 11/21/2021 1422   AST 24 11/21/2021 1422   ALT 34 11/21/2021 1422   ALKPHOS 92 11/21/2021 1422   BILITOT 0.8 11/21/2021 1422   GFRNONAA >60 11/21/2021 1422   GFRAA 91 01/11/2020 1249    No results found for: "SPEP", "UPEP"  Lab Results  Component Value Date   WBC 5.6 11/21/2021   NEUTROABS 3.4 11/21/2021   HGB 15.4 11/21/2021   HCT 43.8 11/21/2021   MCV 91.1 11/21/2021   PLT 208 11/21/2021      Chemistry      Component Value Date/Time   NA 137 11/21/2021 1422   NA 139 01/11/2020 1249   K 4.1 11/21/2021 1422   CL 103 11/21/2021 1422   CO2 29 11/21/2021 1422   BUN 15 11/21/2021 1422   BUN 14 01/11/2020 1249   CREATININE 1.17 11/21/2021 1422      Component Value Date/Time   CALCIUM 9.3 11/21/2021 1422   ALKPHOS 92 11/21/2021 1422   AST 24 11/21/2021 1422   ALT 34 11/21/2021 1422   BILITOT 0.8 11/21/2021 1422       RADIOGRAPHIC STUDIES: We discussed PET/CT imaging extensively I have personally reviewed the radiological images as listed and agreed with the findings in the report. NM PET Image Restage (PS) Skull Base to Thigh (F-18 FDG)  Result Date: 03/16/2022 CLINICAL DATA:  Subsequent treatment strategy for marginal zone lymphoma. EXAM: NUCLEAR MEDICINE PET SKULL BASE TO THIGH TECHNIQUE: 11.43 mCi F-18 FDG was injected intravenously. Full-ring PET imaging was performed from the skull base to thigh after the radiotracer. CT data was obtained and used for attenuation correction and anatomic localization. Fasting blood glucose: 157 mg/dl COMPARISON:  Multiple priors including most recent PET-CT October 29, 2021 FINDINGS: Mediastinal blood pool activity: SUV max 2.4 Liver activity: SUV max 4.0 NECK: No hypermetabolic lymph nodes in the neck. Mildly metabolic opacification of the left maxillary sinus and mucosal thickening of the right maxillary sinus with a max SUV of 2.7. Incidental CT findings: None. CHEST: No hypermetabolic thoracic adenopathy. No hypermetabolic  pulmonary nodules or masses. Incidental CT findings: Aortic atherosclerosis. Coronary artery calcifications. Stable tiny scattered bilateral pulmonary nodules for instance a right upper lobe pulmonary nodule measuring 6 mm on image 39/7 stable over multiple prior examinations and non FDG avid suggestive of a benign etiology. ABDOMEN/PELVIS: No abnormal hypermetabolic activity within the liver, pancreas, adrenal glands, or spleen. No hypermetabolic lymph nodes in the abdomen or pelvis. Diffuse colonic FDG avidity is commonly physiologic/medication-related (metformin). Similar heterogeneous low-level FDG avidity in an enlarged prostate gland. Incidental CT findings: Moderate volume of formed stool in the proximal colon. Aortic atherosclerosis. Colonic diverticulosis. Similar postsurgical change in the low rectum. SKELETON: No focal hypermetabolic activity  to suggest skeletal metastasis. Incidental CT findings: Multilevel degenerative changes spine. Right total hip arthroplasty. IMPRESSION: 1. No convincing evidence of metabolically active lymphomatous disease. 2. Mildly metabolic opacification of the left maxillary sinus and mucosal thickening of the right maxillary sinus, suggestive of sinusitis. 3. Similar right-sided predominant heterogeneous low-level FDG avidity in an enlarged prostate, component of which may reflect patient's known prostate carcinoma. 4. Stable tiny scattered bilateral pulmonary nodules stable over multiple prior examinations and non FDG avid suggestive of a benign etiology. 5. Diffuse colonic FDG avidity is commonly physiologic/medication-related (metformin). 6.  Aortic Atherosclerosis (ICD10-I70.0). Electronically Signed   By: Dahlia Bailiff M.D.   On: 03/16/2022 16:03

## 2022-03-21 MED ORDER — METFORMIN HCL ER 500 MG PO TB24: 1500.0000 mg | ORAL_TABLET | Freq: Every day | ORAL | 5 refills | Status: DC

## 2022-03-21 MED ORDER — CELECOXIB 200 MG PO CAPS: 200.0000 mg | ORAL_CAPSULE | Freq: Every day | ORAL | 2 refills | Status: DC

## 2022-03-22 ENCOUNTER — Other Ambulatory Visit: Payer: Self-pay | Admitting: Family Medicine

## 2022-03-22 DIAGNOSIS — Z79899 Other long term (current) drug therapy: Secondary | ICD-10-CM

## 2022-03-22 DIAGNOSIS — E119 Type 2 diabetes mellitus without complications: Secondary | ICD-10-CM

## 2022-03-22 DIAGNOSIS — E782 Mixed hyperlipidemia: Secondary | ICD-10-CM

## 2022-03-22 DIAGNOSIS — C61 Malignant neoplasm of prostate: Secondary | ICD-10-CM

## 2022-03-23 ENCOUNTER — Telehealth: Payer: Self-pay

## 2022-03-23 ENCOUNTER — Telehealth: Payer: Self-pay | Admitting: *Deleted

## 2022-03-23 NOTE — Telephone Encounter (Signed)
-----   Message from [name], MD sent at 03/23/2022  9:48 AM EDT ----- Pls let him know that Dr. Hilarie Fredrickson will be reaching out to repeat colonoscopy in October and his office will call him for appt

## 2022-03-23 NOTE — Telephone Encounter (Signed)
-----   Message from Jerene Bears, MD sent at 03/23/2022  9:47 AM EDT ----- Regarding: RE: mutual patient I think we should look in Oct and then go from there. Agree no guidelines here. Thanks Ulice Dash.  Dottie May sure recall colon is in place for Oct 2024, hx MALT of colon JMP  ----- Message ----- From: [name], MD Sent: 03/20/2022  12:01 PM EDT To: Jerene Bears, MD Subject: mutual patient                                 Hi,  Just saw Mikki Santee yesterday PET is neg I do not know if there is any specific guidelines related to how often we monitor with colonoscopy He has chronic intermittent diarrhea which is unrelated I plan to see him again in 6 months and maybe scan once a year Appreciate your input  Thanks, Ni

## 2022-03-23 NOTE — Addendum Note (Signed)
Addended by: Ellamae Sia on: 03/23/2022 07:16 AM   Modules accepted: Orders

## 2022-03-23 NOTE — Telephone Encounter (Signed)
Recall placed for 10/2022.

## 2022-03-23 NOTE — Telephone Encounter (Signed)
Called and given message to Mikki Santee and per his request to his wife. They are aware of the below message.

## 2022-03-23 NOTE — Telephone Encounter (Signed)
Attempted to send referral to Dr. Benjamine Mola and his insurance is out of network. Faxed referral to [facility] at [place] location at [phone]. Received fax confirmation.

## 2022-03-25 NOTE — Progress Notes (Deleted)
    Shahida Schnackenberg T. Hani Patnode, MD, [place] at [place], [address]  Phone: [phone]  FAX: [place] - 67 y.o. male  MRN [id number]  Date of Birth: Jan 27, 1955  Date: 03/26/2022  PCP: Owens Loffler, MD  Referral: Owens Loffler, MD  No chief complaint on file.  Subjective:   RAJINDER DOBBERSTEIN is a 67 y.o. very pleasant male patient with There is no height or weight on file to calculate BMI. who presents with the following:  Well-known patient presents with left-sided hip pain.    Review of Systems is noted in the HPI, as appropriate  Objective:   There were no vitals taken for this visit.  GEN: No acute distress; alert,appropriate. PULM: Breathing comfortably in no respiratory distress PSYCH: Normally interactive.   Laboratory and Imaging Data:  Assessment and Plan:   ***

## 2022-03-26 ENCOUNTER — Ambulatory Visit (INDEPENDENT_AMBULATORY_CARE_PROVIDER_SITE_OTHER)
Admission: RE | Admit: 2022-03-26 | Discharge: 2022-03-26 | Disposition: A | Discharge status: Home / Self Care | Payer: Medicare HMO | Source: Ambulatory Visit | Attending: Family Medicine | Admitting: Family Medicine

## 2022-03-26 ENCOUNTER — Encounter: Payer: Self-pay | Admitting: Family Medicine

## 2022-03-26 ENCOUNTER — Ambulatory Visit: Payer: Medicare HMO | Admitting: Family Medicine

## 2022-03-26 ENCOUNTER — Ambulatory Visit (INDEPENDENT_AMBULATORY_CARE_PROVIDER_SITE_OTHER): Payer: Medicare HMO | Admitting: Family Medicine

## 2022-03-26 VITALS — BP 126/74 | HR 76 | Temp 97.9°F | Ht 70.0 in | Wt 221.5 lb

## 2022-03-26 DIAGNOSIS — M1612 Unilateral primary osteoarthritis, left hip: Secondary | ICD-10-CM | POA: Diagnosis not present

## 2022-03-26 NOTE — Progress Notes (Signed)
Charles Fleagle T. Ed Mandich, MD, [place] at [facility] [place], [address]  Phone: [phone]  FAX: [phone]  Charles [name] - 67 y.o. male  MRN [id number]  Date of Birth: 12/30/1955  Date: 03/26/2022  PCP: Owens Loffler, MD  Referral: Owens Loffler, MD  Chief Complaint  Patient presents with   Hip Pain    Left   Subjective:   Charles [name] is a 67 y.o. very pleasant male patient with Body mass index is 31.78 kg/m. who presents with the following:  L hip osteoarthritis: His left hip and groin are really bothering him quite a bit.  It is now become a day-to-day issue and he is having trouble lifting his leg and crossing his leg.  [place]  It is gotten so bad that his wife is even gotten him a cane to use at times.  Lymphoma - bothering him all the time and anxious about it.   L groin is really hurting a lot.   Taking a lot of tylenol and ibuprofen.  He also is on Celebrex.   Review of Systems is noted in the HPI, as appropriate  Objective:   BP 126/74   Pulse 76   Temp 97.9 F (36.6 C) (Temporal)   Ht 5\' 10"  (1.778 m)   Wt 221 lb 8 oz (100.5 kg)   SpO2 95%   BMI 31.78 kg/m   GEN: No acute distress; alert,appropriate. PULM: Breathing comfortably in no respiratory distress PSYCH: Normally interactive.    HIP EXAM: SIDE: L ROM: Abduction, Flexion, Internal and External range of motion: Mild restriction of motion in abduction.  With the hip flexed to 90 degrees he has approximate 40 degree range of motion with internal and external range of motion. Pain with terminal IROM and EROM: Pain with terminal motion GTB: NT SLR: NEG Knees: No effusion FABER: NT REVERSE FABER: NT, neg Piriformis: NT at direct palpation Str: flexion: 4/5 abduction: 5/5 adduction: 4/5 Strength testing tender    Laboratory and Imaging Data:  Assessment and Plan:      ICD-10-CM   1. Primary osteoarthritis of left hip  M16.12 DG HIP UNILAT WITH PELVIS 2-3 VIEWS LEFT    DG FLUORO GUIDED NEEDLE PLC ASPIRATION/INJECTION LOC     Acute on chronic hip osteoarthritis with exacerbation.  He is still on high-dose anti-inflammatories without much significant relief of symptoms.  On his hip x-ray, he does still have some mild osteoarthritis.  I will also review the radiologist interpretation, as well.  I am can arrange for him to have a fluoroscopic guided hip injection.  Medication Management during today's office visit: No orders of the defined types were placed in this encounter.  Medications Discontinued During This Encounter  Medication Reason   meloxicam (MOBIC) 15 MG tablet Completed Course    Orders placed today for conditions managed today: Orders Placed This Encounter  Procedures   DG HIP UNILAT WITH PELVIS 2-3 VIEWS LEFT   DG FLUORO GUIDED NEEDLE PLC ASPIRATION/INJECTION LOC    Disposition: No follow-ups on file.  [employer] One speech-to-text software was used for transcription in this dictation.  Possible transcriptional errors can occur using Editor, commissioning.   Signed,  Maud Deed. Vallarie Fei, MD   Outpatient Encounter Medications as of 03/26/2022  Medication Sig   Alcohol Swabs (B-D SINGLE USE SWABS REGULAR) PADS Use to check blood sugar up to 2 times  a day   aspirin EC 81 MG tablet Take 81 mg by mouth daily.   atorvastatin (LIPITOR) 80 MG tablet Take 1 tablet (80 mg total) by mouth daily.   celecoxib (CELEBREX) 200 MG capsule Take 1 capsule (200 mg total) by mouth daily.   Cetirizine HCl 10 MG CAPS    cholecalciferol (VITAMIN D3) 25 MCG (1000 UNIT) tablet Take 2,000 Units by mouth daily.   CLINPRO 5000 1.1 % PSTE Place onto teeth daily.   Flaxseed, Linseed, (FLAX SEED OIL PO) Take by mouth.   FLUoxetine (PROZAC) 20 MG capsule TAKE 1 CAPSULE BY MOUTH EVERY DAY   gabapentin (NEURONTIN) 300 MG capsule Take 2 capsules (600 mg total) by  mouth at bedtime.   glucose blood (ONETOUCH VERIO) test strip Use to check blood sugar up to 2 times a day.   Lancets (ONETOUCH DELICA PLUS Q000111Q) MISC Use to check blood sugar up to 2 times a day.   LORazepam (ATIVAN) 0.5 MG tablet TAKE 1 TABLET (0.5 MG TOTAL) BY MOUTH EVERY 8 (EIGHT) HOURS AS NEEDED. FOR ANXIETY   losartan (COZAAR) 25 MG tablet Take 1 tablet (25 mg total) by mouth daily.   metFORMIN (GLUCOPHAGE-XR) 500 MG 24 hr tablet Take 3 tablets (1,500 mg total) by mouth daily with breakfast.   Multiple Vitamins-Minerals (MULTIVITAMIN WITH MINERALS) tablet Take 1 tablet by mouth daily.   sildenafil (VIAGRA) 100 MG tablet Take 0.5-1 tablets (50-100 mg total) by mouth daily as needed for erectile dysfunction.   [DISCONTINUED] meloxicam (MOBIC) 15 MG tablet TAKE 1 TABLET (15 MG TOTAL) BY MOUTH DAILY.   No facility-administered encounter medications on file as of 03/26/2022.

## 2022-03-30 ENCOUNTER — Encounter: Payer: Self-pay | Admitting: Family Medicine

## 2022-03-30 DIAGNOSIS — M1612 Unilateral primary osteoarthritis, left hip: Secondary | ICD-10-CM

## 2022-04-01 ENCOUNTER — Other Ambulatory Visit (INDEPENDENT_AMBULATORY_CARE_PROVIDER_SITE_OTHER): Payer: Medicare HMO

## 2022-04-01 DIAGNOSIS — C61 Malignant neoplasm of prostate: Secondary | ICD-10-CM

## 2022-04-01 DIAGNOSIS — E119 Type 2 diabetes mellitus without complications: Secondary | ICD-10-CM

## 2022-04-01 DIAGNOSIS — E782 Mixed hyperlipidemia: Secondary | ICD-10-CM | POA: Diagnosis not present

## 2022-04-01 DIAGNOSIS — Z79899 Other long term (current) drug therapy: Secondary | ICD-10-CM | POA: Diagnosis not present

## 2022-04-01 LAB — CBC WITH DIFFERENTIAL/PLATELET
Basophils Absolute: 0 10*3/uL (ref 0.0–0.1)
Basophils Relative: 0.9 % (ref 0.0–3.0)
Eosinophils Absolute: 0.5 10*3/uL (ref 0.0–0.7)
Eosinophils Relative: 10.7 % — ABNORMAL HIGH (ref 0.0–5.0)
HCT: 45.3 % (ref 39.0–52.0)
Hemoglobin: 15.5 g/dL (ref 13.0–17.0)
Lymphocytes Relative: 17.7 % (ref 12.0–46.0)
Lymphs Abs: 0.9 10*3/uL (ref 0.7–4.0)
MCHC: 34.2 g/dL (ref 30.0–36.0)
MCV: 91.7 fl (ref 78.0–100.0)
Monocytes Absolute: 0.6 10*3/uL (ref 0.1–1.0)
Monocytes Relative: 12.8 % — ABNORMAL HIGH (ref 3.0–12.0)
Neutro Abs: 2.8 10*3/uL (ref 1.4–7.7)
Neutrophils Relative %: 57.9 % (ref 43.0–77.0)
Platelets: 229 10*3/uL (ref 150.0–400.0)
RBC: 4.93 Mil/uL (ref 4.22–5.81)
RDW: 13.1 % (ref 11.5–15.5)
WBC: 4.8 10*3/uL (ref 4.0–10.5)

## 2022-04-01 LAB — MICROALBUMIN / CREATININE URINE RATIO
Creatinine,U: 117.1 mg/dL
Microalb Creat Ratio: 0.7 mg/g (ref 0.0–30.0)
Microalb, Ur: 0.8 mg/dL (ref 0.0–1.9)

## 2022-04-01 LAB — HEPATIC FUNCTION PANEL
ALT: 27 U/L (ref 0–53)
AST: 20 U/L (ref 0–37)
Albumin: 4.3 g/dL (ref 3.5–5.2)
Alkaline Phosphatase: 97 U/L (ref 39–117)
Bilirubin, Direct: 0.2 mg/dL (ref 0.0–0.3)
Total Bilirubin: 0.6 mg/dL (ref 0.2–1.2)
Total Protein: 7.4 g/dL (ref 6.0–8.3)

## 2022-04-01 LAB — BASIC METABOLIC PANEL
BUN: 25 mg/dL — ABNORMAL HIGH (ref 6–23)
CO2: 31 mEq/L (ref 19–32)
Calcium: 9.6 mg/dL (ref 8.4–10.5)
Chloride: 101 mEq/L (ref 96–112)
Creatinine, Ser: 0.9 mg/dL (ref 0.40–1.50)
GFR: 87.95 mL/min (ref >60.00)
Glucose, Bld: 136 mg/dL — ABNORMAL HIGH (ref 70–99)
Potassium: 4.5 mEq/L (ref 3.5–5.1)
Sodium: 137 mEq/L (ref 135–145)

## 2022-04-01 LAB — LIPID PANEL
Cholesterol: 117 mg/dL (ref 0–200)
HDL: 42.7 mg/dL (ref >39.00)
LDL Cholesterol: 60 mg/dL (ref 0–99)
NonHDL: 74.68
Total CHOL/HDL Ratio: 3
Triglycerides: 73 mg/dL (ref 0.0–149.0)
VLDL: 14.6 mg/dL (ref 0.0–40.0)

## 2022-04-01 LAB — HEMOGLOBIN A1C: Hgb A1c MFr Bld: 7.1 % — ABNORMAL HIGH (ref 4.6–6.5)

## 2022-04-03 ENCOUNTER — Encounter: Payer: Self-pay | Admitting: Hematology and Oncology

## 2022-04-03 ENCOUNTER — Encounter: Payer: Self-pay | Admitting: Internal Medicine

## 2022-04-03 LAB — PSA, TOTAL WITH REFLEX TO PSA, FREE: PSA, Total: 1.6 ng/mL (ref <=4.0)

## 2022-04-07 NOTE — Progress Notes (Unsigned)
Charles [name] T. Charles Porro, MD, [place] at [facility] [place], [address]  Phone: [phone]  FAX: [place] - 67 y.o. male  MRN [id number]  Date of Birth: April 05, 1955  Date: 04/09/2022  PCP: Charles Loffler, MD  Referral: Charles Loffler, MD  No chief complaint on file.  Patient Care Team: Charles Loffler, MD as PCP - General (Family Medicine) Alda Berthold, DO as Consulting Physician (Neurology) Subjective:   Charles [name] is a 67 y.o. pleasant patient who presents for a medicare wellness examination:  Preventative Health Maintenance Visit:  Health Maintenance Summary Reviewed and updated, unless pt declines services.  Tobacco History Reviewed. Alcohol: No concerns, no excessive use Exercise Habits: Some activity, rec at least 30 mins 5 times a week STD concerns: no risk or activity to increase risk Drug Use: None  Shingrix Prevnar 20 Foot exam    Health Maintenance  Topic Date Due   Zoster Vaccines- Shingrix (1 of 2) Never done   FOOT EXAM  08/30/2020   Pneumonia Vaccine 46+ Years old (2 of 2 - PCV) 09/07/2020   COVID-19 Vaccine (6 - 2023-24 season) 12/06/2021   Medicare Annual Wellness (AWV)  04/03/2022   OPHTHALMOLOGY EXAM  05/22/2022   INFLUENZA VACCINE  08/06/2022   COLONOSCOPY (Pts 45-41yrs Insurance coverage will need to be confirmed)  10/01/2022   HEMOGLOBIN A1C  10/02/2022   Diabetic kidney evaluation - eGFR measurement  04/01/2023   Diabetic kidney evaluation - Urine ACR  04/01/2023   DTaP/Tdap/Td (2 - Td or Tdap) 02/26/2026   Hepatitis C Screening  Completed   HPV VACCINES  Aged Out    Immunization History  Administered Date(s) Administered   COVID-19, mRNA, vaccine(Comirnaty)12 years and older 10/11/2021   Fluad Quad(high Dose 65+) 10/28/2020, 10/11/2021   Influenza, High Dose Seasonal PF 09/08/2019   Influenza,inj,Quad PF,6+ Mos 10/18/2014, 10/26/2016,  11/19/2017, 09/20/2018   PFIZER(Purple Top)SARS-COV-2 Vaccination 02/10/2019, 03/07/2019, 11/16/2019   Pfizer Covid-19 Vaccine Bivalent Booster 33yrs & up 10/28/2020   Pneumococcal Polysaccharide-23 10/26/2016, 09/08/2019   Respiratory Syncytial Virus Vaccine,Recomb Aduvanted(Arexvy) 10/11/2021   Tdap 02/27/2016    Patient Active Problem List   Diagnosis Date Noted   Prostate cancer 04/02/2021    Priority: High   Marginal zone B-cell lymphoma 06/10/2020    Priority: High   Hypertension, essential 01/15/2022    Priority: Medium    Hyperlipidemia 10/21/2014    Priority: Medium    Generalized anxiety disorder 10/21/2014    Priority: Medium    Controlled type 2 diabetes mellitus without complication, without long-term current use of insulin     Priority: Medium    Chronic sinusitis 03/20/2022   History of skin cancer 06/10/2020   GERD (gastroesophageal reflux disease)     Past Medical History:  Diagnosis Date   Anxiety    Controlled type 2 diabetes mellitus without complication, without long-term current use of insulin ([place])    Diverticulitis    Generalized anxiety disorder 10/21/2014   GERD (gastroesophageal reflux disease)    Lymphoma ([place])    Marginal zone B-cell lymphoma ([place]) 06/10/2020   Prostate cancer ([place]) 04/02/2021   Tubular adenoma of colon     Past Surgical History:  Procedure Laterality Date   ACHILLES TENDON REPAIR  2001   BICEPS TENDON REPAIR Left 2014   COLONOSCOPY  2012   polyps-[place]   HEMORROIDECTOMY  2012   ROTATOR CUFF REPAIR Left 2014   TOTAL HIP  ARTHROPLASTY Right 12/07/2014   Procedure: RIGHT TOTAL HIP ARTHROPLASTY ANTERIOR APPROACH;  Surgeon: Mcarthur Rossetti, MD;  Location: [facility];  Service: Orthopedics;  Laterality: Right;    Family History  Problem Relation Age of Onset   Alcohol abuse Father    Heart attack Father    Hyperlipidemia Father    Stroke Father    Hypertension Father    Breast cancer Mother 7   Hypertension Mother     Colon cancer Neg Hx    Colon polyps Neg Hx    Esophageal cancer Neg Hx    Rectal cancer Neg Hx    Stomach cancer Neg Hx     Social History   Social History Narrative   Married, Arts administrator baseball player   Avid golfer      Are you right handed or left handed? Right Handed    Are you currently employed ? 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 No   Who lives with you? With Wife    What type of home do you live in: 1 story or 2 story? Two story home          Wife is a retired Marine scientist         Past Medical History, Surgical History, Social History, Family History, Problem List, Medications, and Allergies have been reviewed and updated if relevant.  Review of Systems: Pertinent positives are listed above.  Otherwise, a full 14 point review of systems has been done in full and it is negative except where it is noted positive.  Objective:   There were no vitals taken for this visit.    11/14/2021    9:43 AM 11/24/2021    8:48 AM 12/08/2021   11:26 AM 12/15/2021    8:30 AM 03/16/2022   10:28 AM  Fall Risk  Falls in the past year? 1    0  Was there an injury with Fall? 0    0  Fall Risk Category Calculator 2    0  Fall Risk Category (Retired) Moderate      (RETIRED) Patient Fall Risk Level Moderate fall risk High fall risk High fall risk Low fall risk   Fall risk Follow up     Falls evaluation completed   Ideal Body Weight:   No results found.    04/02/2021    9:10 AM 08/31/2019    8:36 AM 12/23/2017    9:50 AM 07/15/2016    4:22 PM  Depression screen PHQ 2/9  Decreased Interest 0 0 0 2  Down, Depressed, Hopeless 0 0 0 2  PHQ - 2 Score 0 0 0 4  Altered sleeping    3  Tired, decreased energy    3  Change in appetite    3  Feeling bad or failure about yourself     3  Trouble concentrating    3  Moving slowly or fidgety/restless    3  Suicidal thoughts    1  PHQ-9 Score    23     GEN: well  developed, well nourished, no acute distress Eyes: conjunctiva and lids normal, PERRLA, EOMI ENT: TM clear, nares clear, oral exam WNL Neck: supple, no lymphadenopathy, no thyromegaly, no JVD Pulm: clear to auscultation and percussion, respiratory effort normal CV: regular rate and rhythm, S1-S2, no murmur, rub or gallop, no bruits, peripheral pulses normal and symmetric, no  cyanosis, clubbing, edema or varicosities GI: soft, non-tender; no hepatosplenomegaly, masses; active bowel sounds all quadrants GU: deferred Lymph: no cervical, axillary or inguinal adenopathy MSK: gait normal, muscle tone and strength WNL, no joint swelling, effusions, discoloration, crepitus  SKIN: clear, good turgor, color WNL, no rashes, lesions, or ulcerations Neuro: normal mental status, normal strength, sensation, and motion Psych: alert; oriented to person, place and time, normally interactive and not anxious or depressed in appearance.  All labs reviewed with patient.  Results for orders placed or performed in visit on 04/01/22  PSA, Total with Reflex to PSA, Free  Result Value Ref Range   PSA, Total 1.6 < OR = 4.0 ng/mL  Microalbumin / creatinine urine ratio  Result Value Ref Range   Microalb, Ur 0.8 0.0 - 1.9 mg/dL   Creatinine,U 117.1 mg/dL   Microalb Creat Ratio 0.7 0.0 - 30.0 mg/g  Lipid panel  Result Value Ref Range   Cholesterol 117 0 - 200 mg/dL   Triglycerides 73.0 0.0 - 149.0 mg/dL   HDL 42.70 >39.00 mg/dL   VLDL 14.6 0.0 - 40.0 mg/dL   LDL Cholesterol 60 0 - 99 mg/dL   Total CHOL/HDL Ratio 3    NonHDL 74.68   Hepatic function panel  Result Value Ref Range   Total Bilirubin 0.6 0.2 - 1.2 mg/dL   Bilirubin, Direct 0.2 0.0 - 0.3 mg/dL   Alkaline Phosphatase 97 39 - 117 U/L   AST 20 0 - 37 U/L   ALT 27 0 - 53 U/L   Total Protein 7.4 6.0 - 8.3 g/dL   Albumin 4.3 3.5 - 5.2 g/dL  Hemoglobin A1c  Result Value Ref Range   Hgb A1c MFr Bld 7.1 (H) 4.6 - 6.5 %  CBC with Differential/Platelet   Result Value Ref Range   WBC 4.8 4.0 - 10.5 K/uL   RBC 4.93 4.22 - 5.81 Mil/uL   Hemoglobin 15.5 13.0 - 17.0 g/dL   HCT 45.3 39.0 - 52.0 %   MCV 91.7 78.0 - 100.0 fl   MCHC 34.2 30.0 - 36.0 g/dL   RDW 13.1 11.5 - 15.5 %   Platelets 229.0 150.0 - 400.0 K/uL   Neutrophils Relative % 57.9 43.0 - 77.0 %   Lymphocytes Relative 17.7 12.0 - 46.0 %   Monocytes Relative 12.8 (H) 3.0 - 12.0 %   Eosinophils Relative 10.7 (H) 0.0 - 5.0 %   Basophils Relative 0.9 0.0 - 3.0 %   Neutro Abs 2.8 1.4 - 7.7 K/uL   Lymphs Abs 0.9 0.7 - 4.0 K/uL   Monocytes Absolute 0.6 0.1 - 1.0 K/uL   Eosinophils Absolute 0.5 0.0 - 0.7 K/uL   Basophils Absolute 0.0 0.0 - 0.1 K/uL  Basic metabolic panel  Result Value Ref Range   Sodium 137 135 - 145 mEq/L   Potassium 4.5 3.5 - 5.1 mEq/L   Chloride 101 96 - 112 mEq/L   CO2 31 19 - 32 mEq/L   Glucose, Bld 136 (H) 70 - 99 mg/dL   BUN 25 (H) 6 - 23 mg/dL   Creatinine, Ser 0.90 0.40 - 1.50 mg/dL   GFR 87.95 >60.00 mL/min   Calcium 9.6 8.4 - 10.5 mg/dL    Assessment and Plan:     ICD-10-CM   1. Healthcare maintenance  Z00.00       Health Maintenance Exam: The patient's preventative maintenance and recommended screening tests for an annual wellness exam were reviewed in full today. Brought up to date unless  services declined.  Counselled on the importance of diet, exercise, and its role in overall health and mortality. The patient's FH and SH was reviewed, including their home life, tobacco status, and drug and alcohol status.  Follow-up in 1 year for physical exam or additional follow-up below.  I have personally reviewed the Medicare Annual Wellness questionnaire and have noted 1. The patient's medical and social history 2. Their use of alcohol, tobacco or illicit drugs 3. Their current medications and supplements 4. The patient's functional ability including ADL's, fall risks, home safety risks and hearing or visual             impairment. 5. Diet and  physical activities 6. Evidence for depression or mood disorders 7. Reviewed Updated provider list, see scanned forms and [facility] Snapshot.  8. Reviewed whether or not the patient has HCPOA or living will, and discussed what this means with the patient.  Recommended he bring in a copy for his chart in [facility].  The patients weight, height, BMI and visual acuity have been recorded in the chart I have made referrals, counseling and provided education to the patient based review of the above and I have provided the pt with a written personalized care plan for preventive services.  I have provided the patient with a copy of your personalized plan for preventive services. Instructed to take the time to review along with their updated medication list.  Disposition: No follow-ups on file.  Future Appointments  Date Time Provider [place]  04/09/2022  9:00 AM Charles Loffler, MD [facility]  04/22/2022 10:00 AM Mcarthur Rossetti, MD [facility] None  05/27/2022  8:20 AM Donato Heinz, MD [facility] None  07/21/2022  9:30 AM Narda Amber K, DO [facility] None  09/22/2022  7:30 AM [facility] None  09/22/2022  8:00 AM [name], MD [facility] None  10/06/2022 10:00 AM [facility] None  10/15/2022  9:15 AM Billey Co, MD [facility] None    No orders of the defined types were placed in this encounter.  There are no discontinued medications. No orders of the defined types were placed in this encounter.   Signed,  Maud Deed. Hokulani Rogel, MD   Allergies as of 04/09/2022   No Known Allergies      Medication List        Accurate as of April 07, 2022  2:02 PM. If you have any questions, ask your nurse or doctor.          aspirin EC 81 MG tablet Take 81 mg by mouth daily.   atorvastatin 80 MG tablet Commonly known as: LIPITOR Take 1 tablet (80 mg total) by mouth daily.   B-D SINGLE USE SWABS REGULAR Pads Use to check blood sugar up to 2 times a day    celecoxib 200 MG capsule Commonly known as: CeleBREX Take 1 capsule (200 mg total) by mouth daily.   Cetirizine HCl 10 MG Caps   cholecalciferol 25 MCG (1000 UNIT) tablet Commonly known as: VITAMIN D3 Take 2,000 Units by mouth daily.   Clinpro 5000 1.1 % Pste Generic drug: Sodium Fluoride Place onto teeth daily.   FLAX SEED OIL PO Take by mouth.   FLUoxetine 20 MG capsule Commonly known as: PROZAC TAKE 1 CAPSULE BY MOUTH EVERY DAY   gabapentin 300 MG capsule Commonly known as: NEURONTIN Take 2 capsules (600 mg total) by mouth at bedtime.   LORazepam 0.5 MG tablet Commonly known as: ATIVAN TAKE 1 TABLET (0.5  MG TOTAL) BY MOUTH EVERY 8 (EIGHT) HOURS AS NEEDED. FOR ANXIETY   losartan 25 MG tablet Commonly known as: COZAAR Take 1 tablet (25 mg total) by mouth daily.   metFORMIN 500 MG 24 hr tablet Commonly known as: GLUCOPHAGE-XR Take 3 tablets (1,500 mg total) by mouth daily with breakfast.   multivitamin with minerals tablet Take 1 tablet by mouth daily.   OneTouch Delica Plus 123456 Misc Use to check blood sugar up to 2 times a day.   OneTouch Verio test strip Generic drug: glucose blood Use to check blood sugar up to 2 times a day.   sildenafil 100 MG tablet Commonly known as: Viagra Take 0.5-1 tablets (50-100 mg total) by mouth daily as needed for erectile dysfunction.

## 2022-04-09 ENCOUNTER — Ambulatory Visit (INDEPENDENT_AMBULATORY_CARE_PROVIDER_SITE_OTHER): Payer: Medicare HMO | Admitting: Family Medicine

## 2022-04-09 ENCOUNTER — Encounter: Payer: Self-pay | Admitting: Family Medicine

## 2022-04-09 VITALS — BP 122/75 | HR 73 | Temp 97.8°F | Ht 69.5 in | Wt 223.0 lb

## 2022-04-09 DIAGNOSIS — Z23 Encounter for immunization: Secondary | ICD-10-CM | POA: Diagnosis not present

## 2022-04-09 DIAGNOSIS — Z0000 Encounter for general adult medical examination without abnormal findings: Secondary | ICD-10-CM | POA: Diagnosis not present

## 2022-04-09 MED ORDER — CLOBETASOL PROPIONATE 0.05 % EX OINT: 1.0000 | TOPICAL_OINTMENT | Freq: Two times a day (BID) | CUTANEOUS | 1 refills | Status: DC

## 2022-04-15 ENCOUNTER — Other Ambulatory Visit: Payer: Self-pay | Admitting: Family Medicine

## 2022-04-22 ENCOUNTER — Encounter: Payer: Self-pay | Admitting: Orthopaedic Surgery

## 2022-04-22 ENCOUNTER — Ambulatory Visit (INDEPENDENT_AMBULATORY_CARE_PROVIDER_SITE_OTHER): Payer: Medicare HMO | Admitting: Orthopaedic Surgery

## 2022-04-22 DIAGNOSIS — M1612 Unilateral primary osteoarthritis, left hip: Secondary | ICD-10-CM | POA: Diagnosis not present

## 2022-04-22 IMAGING — MR MR PROSTATE WO/W CM
12 series · 48 of 48 positions shown · IV contrast (gadavist)
Comparison: PET-CT 06/24/2020

CLINICAL DATA: Gleason 3+3=6 prostate adenocarcinoma in the left
prostate gland. Prior hypermetabolic activity in this vicinity seen
on PET-CT of 06/24/2020 (performed for B-lymphoma).

EXAM:
MR PROSTATE WITHOUT AND WITH CONTRAST
TECHNIQUE: Multiplanar multisequence MRI images were obtained of the pelvis
centered about the prostate. Pre and post contrast images were
obtained.
CONTRAST:  10mL GADAVIST GADOBUTROL 1 MMOL/ML IV SOLN

[Series 3: T1 · axial · 5.0mm · 1.19mm/px · z∈[-128,+227]mm · 2 of 72 slices shown (1 of 2)]
[im 1/72]
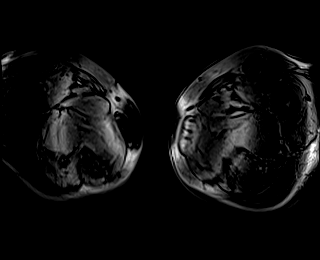
[im 72/72]
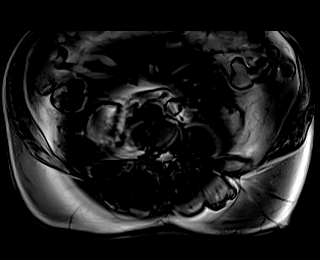

[Series 4: T1 · axial · 5.0mm · 1.19mm/px · z∈[-128,+227]mm · 2 of 72 slices shown (2 of 2)]
[im 1/72]
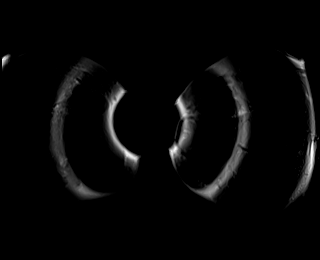
[im 72/72]
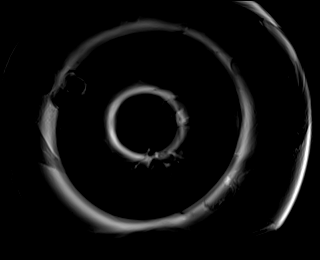

[Series 5: T2 · axial · 3.0mm · 0.47mm/px · 1 of 29 slices shown (1 of 3)]
[im 1/29]
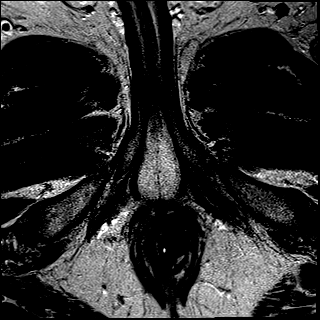

[Series 6: T2 · coronal · 3.0mm · 0.47mm/px · 1 of 24 slices shown (2 of 3)]
[im 1/24]
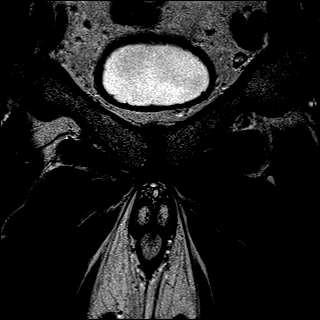

[Series 7: T2 · axial · 1.0mm · 1.00mm/px · z∈[-2,+61]mm · 2 of 64 slices shown (3 of 3)]
[im 1/64]
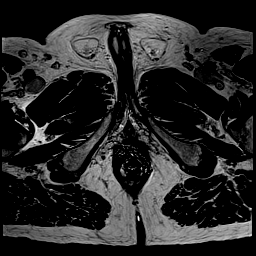
[im 64/64]
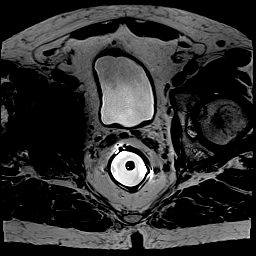

[Series 8: ep2d_diff_b100_500_800_tra_endo**_tracew_dfc_mix · axial · 3.0mm · 1.60mm/px · z∈[-7,+65]mm · 2 of 71 slices shown]
[im 1/71]
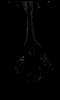
[im 71/71]
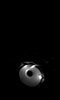

[Series 9: ep2d_diff_b100_500_800_tra_endo**_adc_dfc_mix · axial · 3.0mm · 1.60mm/px · 1 of 25 slices shown]
[im 1/25]
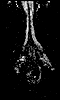

[Series 10: ep2d_diff_b100_500_800_tra_endo**_calc_bval_dfc_mix · axial · 3.0mm · 1.60mm/px · 1 of 25 slices shown]
[im 1/25]
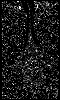

[Series 11: ep2d_diff_bvalue (id) · axial · 3.0mm · 1.60mm/px · 1 of 24 slices shown]
[im 1/24]
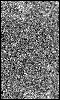

[Series 12: axial multiphase · axial · 3.0mm · 0.98mm/px · z∈[-11,+70]mm · 17 of 560 slices shown]
[im 1/560]
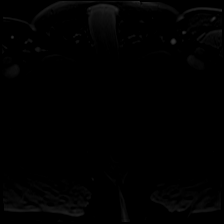
[im 35/560]
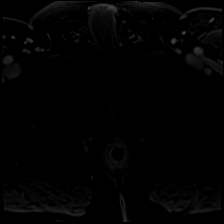
[im 70/560]
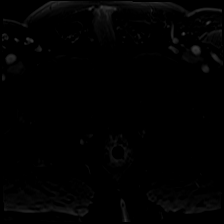
[im 105/560]
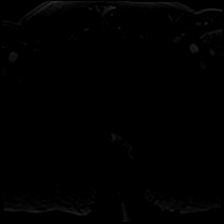
[im 140/560]
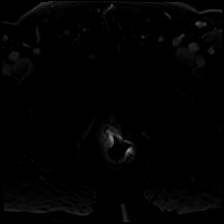
[im 175/560]
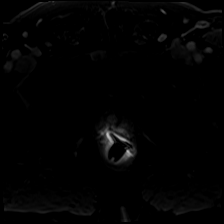
[im 210/560]
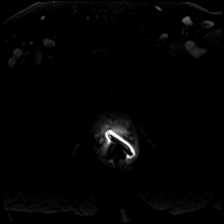
[im 245/560]
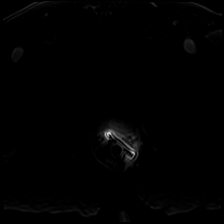
[im 280/560]
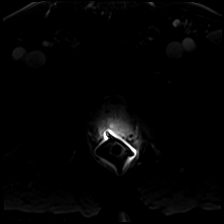
[im 315/560]
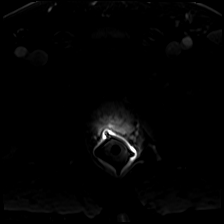
[im 350/560]
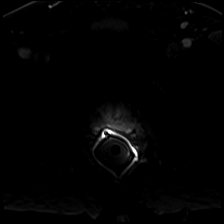
[im 385/560]
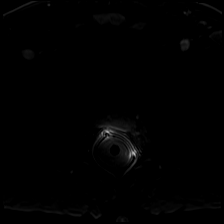
[im 420/560]
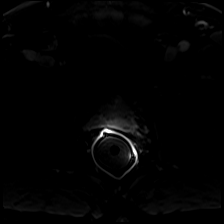
[im 455/560]
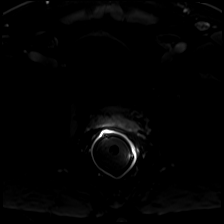
[im 490/560]
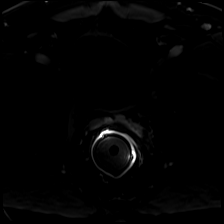
[im 525/560]
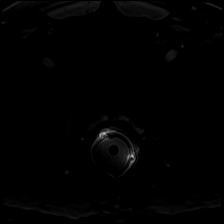
[im 560/560]
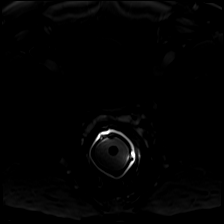

[Series 13: axial multiphase_sub · axial · 3.0mm · 0.98mm/px · z∈[-11,+70]mm · 16 of 531 slices shown]
[im 1/531]
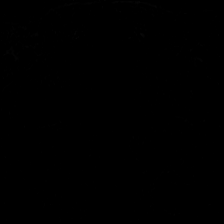
[im 36/531]
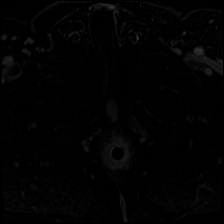
[im 71/531]
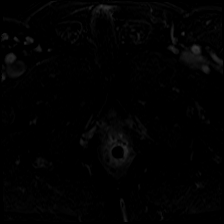
[im 107/531]
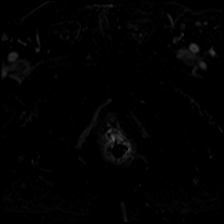
[im 142/531]
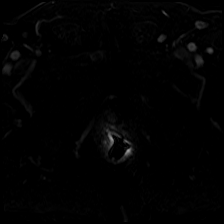
[im 177/531]
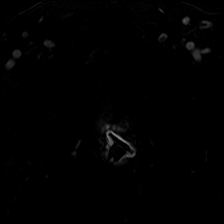
[im 213/531]
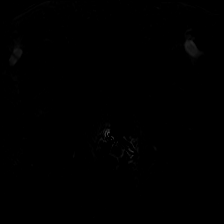
[im 248/531]
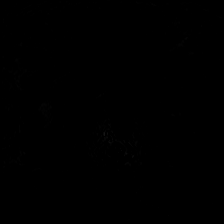
[im 283/531]
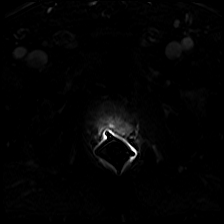
[im 319/531]
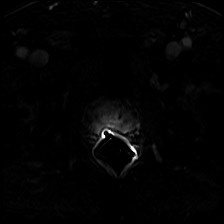
[im 354/531]
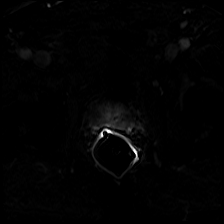
[im 389/531]
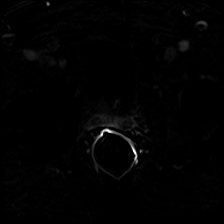
[im 425/531]
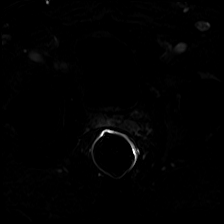
[im 460/531]
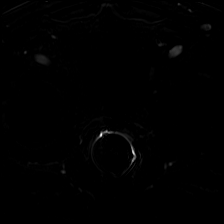
[im 495/531]
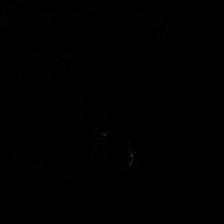
[im 531/531]
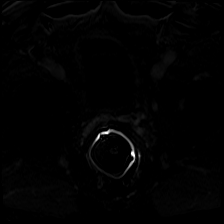

[Series 15: iliac crest thru · axial · 2.5mm · 1.19mm/px · z∈[-50,+148]mm · 2 of 80 slices shown]
[im 1/80]
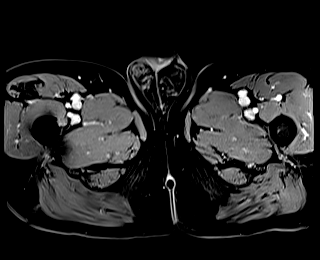
[im 80/80]
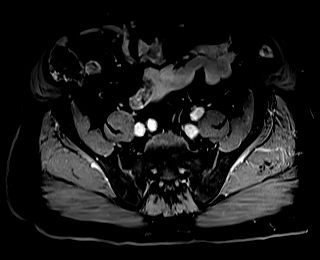

[48 of 48 positions shown; findings below may reference images not displayed]

FINDINGS: Prostate: The patient has a right hip prosthesis. Endorectal coil
was utilized.

There is hazy low T2 signal throughout the peripheral zone in a
nonfocal manner, likely postinflammatory and considered PI-RADS
category 2.

Encapsulated nodularity in the transition zone compatible with
benign prostatic hypertrophy.

Region of interest # 1: PI-RADS category 3 lesion of the right
posteromedial and right posterolateral peripheral zone at the
junction of the mid gland and apex, with focally reduced T2 signal
(image 40, series 7). This measures 0.62 cc (1.5 by 1.0 by 0.6 cm)
and correlates to some faintly accentuated metabolic activity on the
prior PET-CT (although not as intense as the left anterior
activity).

In the area of concern along the left anterior mid gland where there
was previous hypermetabolic activity on biopsy proven cancer, no
lesion of intermediate or higher suspicion based on MRI criteria is
identified.

Volume: 3D volumetric analysis: Prostate volume 44.90 cc (5.4 by
by 4.7 cm).

Transcapsular spread:  Absent

Seminal vesicle involvement: Absent

Neurovascular bundle involvement: Absent

Pelvic adenopathy: Absent

Bone metastasis: Absent

Other findings: Sigmoid colon diverticulosis.
IMPRESSION: 1. PI-RADS category 3 lesion in the right posterior peripheral zone
at the junction of the mid gland and apex. There is previously some
faintly accentuated metabolic activity in this vicinity although
less striking than the left anterior activity on prior PET-CT.
Targeting data sent to [facility].
2. The highly hypermetabolic focal left anterior prostate lesion
seen on prior PET-CT does not have a specific correlating lesion on
MRI today.
3. Borderline prostatomegaly. Encapsulated nodularity in the
transition zone compatible with benign prostatic hypertrophy.
4. Sigmoid colon diverticulosis.

## 2022-04-22 NOTE — Progress Notes (Signed)
HPI: Mr. Fickert comes in today for left hip pain.  He states he is having significant left hip pain to be 10 out of 10 pain at worst.  Pain is mostly in the groin area.  Has difficulty putting on shoes and socks.  He states that he is unable to walk for exercise.  He does like to play golf and finds this difficult due to the pain in his left hip groin region. Past medical history pertinent for diabetes mellitus marginal zone B cell lymphoma, prostate cancer, hypertension.  Hemoglobin A1c 3 weeks ago was 7.1.  He has a history of right total hip arthroplasty by Dr. Magnus Ivan and 2016 which is done well.  He has tried NSAIDs.  He is not interested in any injections in the hip. Recently had radiographs of his left hip and pelvis.  Knees are read as mild arthritis of the left hip.  Reviewed the films and these films.  Show more moderate narrowing of the left hip joint.  No acute fractures.  Right total hip arthroplasty components appear well-seated.  No acute findings.  Review of systems: Denies any chest pain, shortness of breath, ongoing infections, fevers or chills.  Physical exam: General well-developed well-nourished male no acute distress mood and affect appropriate. Psych: Alert and oriented x 3  Hips: Good range of motion right hip without pain.  Left hip he has good external rotation but virtually no internal rotation and this causes pain.  Calves are supple and nontender bilaterally dorsiflexion plantarflexion bilateral ankles intact.  Ambulates without any assistive device.  Impression: Left hip osteoarthritis  Plan: Given the fact that patient's pain is affecting his quality of life and he is not able to do the activities that he wants to.  Recommend either hip injection or left total hip arthroplasty.  He is not interested in any type of hip injection.  He understands risk benefits and the postoperative protocol call involving hip replacement as he has had this performed on the right.   Will  work on setting him up for a left total hip arthroplasty in the near future.  Questions were encouraged and answered by Dr. Magnus Ivan and myself.

## 2022-04-29 ENCOUNTER — Encounter: Payer: Self-pay | Admitting: Orthopaedic Surgery

## 2022-05-04 ENCOUNTER — Encounter: Payer: Self-pay | Admitting: Neurology

## 2022-05-04 ENCOUNTER — Encounter: Payer: Self-pay | Admitting: Internal Medicine

## 2022-05-04 ENCOUNTER — Encounter: Payer: Self-pay | Admitting: Family Medicine

## 2022-05-04 MED ORDER — METFORMIN HCL ER 500 MG PO TB24: 2000.0000 mg | ORAL_TABLET | Freq: Every day | ORAL | 5 refills | Status: DC

## 2022-05-06 DIAGNOSIS — J343 Hypertrophy of nasal turbinates: Secondary | ICD-10-CM | POA: Diagnosis not present

## 2022-05-06 DIAGNOSIS — R438 Other disturbances of smell and taste: Secondary | ICD-10-CM | POA: Diagnosis not present

## 2022-05-06 DIAGNOSIS — J320 Chronic maxillary sinusitis: Secondary | ICD-10-CM | POA: Diagnosis not present

## 2022-05-06 DIAGNOSIS — R0683 Snoring: Secondary | ICD-10-CM | POA: Diagnosis not present

## 2022-05-11 ENCOUNTER — Other Ambulatory Visit: Payer: Self-pay

## 2022-05-11 ENCOUNTER — Other Ambulatory Visit: Payer: Self-pay | Admitting: Family Medicine

## 2022-05-11 DIAGNOSIS — E119 Type 2 diabetes mellitus without complications: Secondary | ICD-10-CM

## 2022-05-20 ENCOUNTER — Other Ambulatory Visit: Payer: Self-pay

## 2022-05-22 ENCOUNTER — Encounter: Payer: Self-pay | Admitting: Orthopaedic Surgery

## 2022-05-22 ENCOUNTER — Encounter: Payer: Self-pay | Admitting: Family Medicine

## 2022-05-22 MED ORDER — LORAZEPAM 0.5 MG PO TABS: 0.5000 mg | ORAL_TABLET | Freq: Three times a day (TID) | ORAL | 1 refills | Status: DC | PRN

## 2022-05-22 NOTE — Telephone Encounter (Signed)
Last office visit 04/09/22 for MWV.  Last refilled 02/26/22 for #30 with 1 refill.  Next appt: 10/12/22 for DM.

## 2022-05-25 NOTE — Progress Notes (Addendum)
PCP - Dr. Brent Bulla 04-09-22 epic Cardiologist - Dr. Bjorn Pippin has an appt. 05-27-22 @ 8am for regular checkup  LOV 11-20-21 epic  PPM/ICD -  Device Orders -  Rep Notified -   Chest x-ray -  EKG - 11-20-21 epic Stress Test -  ECHO - 2021 Cardiac Cath -  CT coronary -01-18-20  Sleep Study -  CPAP -   Fasting Blood Sugar - 118 Checks Blood Sugar __1___ times a week  Blood Thinner Instructions: Aspirin Instructions:81 mg asa  not to stop per Magnus Ivan  ERAS Protcol - PRE-SURGERY  G2-    COVID vaccine -x3  Activity--Able to climb a flight of stairs without SOB or CP Anesthesia review: Mild nonobstructive CAD, DM HTN  Patient denies shortness of breath, fever, cough and chest pain at PAT appointment   All instructions explained to the patient, with a verbal understanding of the material. Patient agrees to go over the instructions while at home for a better understanding. Patient also instructed to self quarantine after being tested for COVID-19. The opportunity to ask questions was provided.

## 2022-05-25 NOTE — Patient Instructions (Signed)
SURGICAL WAITING ROOM VISITATION  Patients having surgery or a procedure may have no more than 2 support people in the waiting area - these visitors may rotate.    Children under the age of 41 must have an adult with them who is not the patient.  Due to an increase in RSV and influenza rates and associated hospitalizations, children ages 26 and under may not visit patients in [facility] hospitals.  If the patient needs to stay at the hospital during part of their recovery, the visitor guidelines for inpatient rooms apply. Pre-op nurse will coordinate an appropriate time for 1 support person to accompany patient in pre-op.  This support person may not rotate.    Please refer to the [facility] website for the visitor guidelines for Inpatients (after your surgery is over and you are in a regular room).       Your procedure is scheduled on: 05-29-22   Report to [facility] Main Entrance    Report to admitting at       0615 AM   Call this number if you have problems the morning of surgery [phone]   Do not eat food :After Midnight.   After Midnight you may have the following liquids until _0545_____ AM/ DAY OF SURGERY   then nothing by mouth  Water Non-Citrus Juices (without pulp, NO RED-Apple, White grape, White cranberry) Black Coffee (NO MILK/CREAM OR CREAMERS, sugar ok)  Clear Tea (NO MILK/CREAM OR CREAMERS, sugar ok) regular and decaf                             Plain Jell-O (NO RED)                                           Fruit ices (not with fruit pulp, NO RED)                                     Popsicles (NO RED)                                                               Sports drinks like Gatorade (NO RED)                   The day of surgery:  Drink ONE (1) Pre-Surgery G2 at 0535  AM the morning of surgery. Drink in one sitting. Do not sip.  This drink was given to you during your hospital  pre-op appointment visit. Nothing else to drink after  completing the  Pre-Surgery G2.by 0545 am          If you have questions, please contact your surgeon's office.   FOLLOW ANY ADDITIONAL PRE OP INSTRUCTIONS YOU RECEIVED FROM YOUR SURGEON'S OFFICE!!!     Oral Hygiene is also important to reduce your risk of infection.  Remember - BRUSH YOUR TEETH THE MORNING OF SURGERY WITH YOUR REGULAR TOOTHPASTE  DENTURES WILL BE REMOVED PRIOR TO SURGERY PLEASE DO NOT APPLY "Poly grip" OR ADHESIVES!!!   Do NOT smoke after Midnight   Take these medicines the morning of surgery with A SIP OF WATER: Sulfamethoxazole-trimethoprim, eye drops, lorazepam if needed, fluoxetine, zyrtec, atorvastatin, tylenol if needed  DO NOT TAKE ANY ORAL DIABETIC MEDICATIONS DAY OF YOUR SURGERY  Bring CPAP mask and tubing day of surgery.                              You may not have any metal on your body including hair pins, jewelry, and body piercing             Do not wear  lotions, powders, perfumes/cologne, or deodorant              Men may shave face and neck.   Do not bring valuables to the hospital. [facility] IS NOT             RESPONSIBLE   FOR VALUABLES.   Contacts, glasses, dentures or bridgework may not be worn into surgery.   Bring small overnight bag day of surgery.   DO NOT BRING YOUR HOME MEDICATIONS TO THE HOSPITAL. PHARMACY WILL DISPENSE MEDICATIONS LISTED ON YOUR MEDICATION LIST TO YOU DURING YOUR ADMISSION IN THE HOSPITAL!                 Please read over the following fact sheets you were given: IF YOU HAVE QUESTIONS ABOUT YOUR PRE-OP INSTRUCTIONS PLEASE CALL [phone]   IIf you test positive for Covid or have been in contact with anyone that has tested positive in the last 10 days please notify you surgeon.    Pre-operative 5 CHG Bath Instructions   You can play a key role in reducing the risk of infection after surgery. Your skin needs to be as free of germs as possible. You can reduce the  number of germs on your skin by washing with CHG (chlorhexidine gluconate) soap before surgery. CHG is an antiseptic soap that kills germs and continues to kill germs even after washing.   DO NOT use if you have an allergy to chlorhexidine/CHG or antibacterial soaps. If your skin becomes reddened or irritated, stop using the CHG and notify one of our RNs at [phone].   Please shower with the CHG soap starting 4 days before surgery using the following schedule:     Please keep in mind the following:  DO NOT shave, including legs and underarms, starting the day of your first shower.   You may shave your face at any point before/day of surgery.  Place clean sheets on your bed the day you start using CHG soap. Use a clean washcloth (not used since being washed) for each shower. DO NOT sleep with pets once you start using the CHG.   CHG Shower Instructions:  If you choose to wash your hair and private area, wash first with your normal shampoo/soap.  After you use shampoo/soap, rinse your hair and body thoroughly to remove shampoo/soap residue.  Turn the water OFF and apply about 3 tablespoons (45 ml) of CHG soap to a CLEAN washcloth.  Apply CHG soap ONLY FROM YOUR NECK DOWN TO YOUR TOES (washing for 3-5 minutes)  DO NOT use CHG soap on face, private areas, open wounds, or sores.  Pay special attention  to the area where your surgery is being performed.  If you are having back surgery, having someone wash your back for you may be helpful. Wait 2 minutes after CHG soap is applied, then you may rinse off the CHG soap.  Pat dry with a clean towel  Put on clean clothes/pajamas   If you choose to wear lotion, please use ONLY the CHG-compatible lotions on the back of this paper.     Additional instructions for the day of surgery: DO NOT APPLY any lotions, deodorants, cologne, or perfumes.   Put on clean/comfortable clothes.  Brush your teeth.  Ask your nurse before applying any prescription  medications to the skin.      CHG Compatible Lotions   Aveeno Moisturizing lotion  Cetaphil Moisturizing Cream  Cetaphil Moisturizing Lotion  Clairol Herbal Essence Moisturizing Lotion, Dry Skin  Clairol Herbal Essence Moisturizing Lotion, Extra Dry Skin  Clairol Herbal Essence Moisturizing Lotion, Normal Skin  Curel Age Defying Therapeutic Moisturizing Lotion with Alpha Hydroxy  Curel Extreme Care Body Lotion  Curel Soothing Hands Moisturizing Hand Lotion  Curel Therapeutic Moisturizing Cream, Fragrance-Free  Curel Therapeutic Moisturizing Lotion, Fragrance-Free  Curel Therapeutic Moisturizing Lotion, Original Formula  Eucerin Daily Replenishing Lotion  Eucerin Dry Skin Therapy Plus Alpha Hydroxy Crme  Eucerin Dry Skin Therapy Plus Alpha Hydroxy Lotion  Eucerin Original Crme  Eucerin Original Lotion  Eucerin Plus Crme Eucerin Plus Lotion  Eucerin TriLipid Replenishing Lotion  Keri Anti-Bacterial Hand Lotion  Keri Deep Conditioning Original Lotion Dry Skin Formula Softly Scented  Keri Deep Conditioning Original Lotion, Fragrance Free Sensitive Skin Formula  Keri Lotion Fast Absorbing Fragrance Free Sensitive Skin Formula  Keri Lotion Fast Absorbing Softly Scented Dry Skin Formula  Keri Original Lotion  Keri Skin Renewal Lotion Keri Silky Smooth Lotion  Keri Silky Smooth Sensitive Skin Lotion  Nivea Body Creamy Conditioning Oil  Nivea Body Extra Enriched Lotion  Nivea Body Original Lotion  Nivea Body Sheer Moisturizing Lotion Nivea Crme  Nivea Skin Firming Lotion  NutraDerm 30 Skin Lotion  NutraDerm Skin Lotion  NutraDerm Therapeutic Skin Cream  NutraDerm Therapeutic Skin Lotion  ProShield Protective Hand Cream  Provon moisturizing lotion             Incentive Spirometer  An incentive spirometer is a tool that can help keep your lungs clear and active. This tool measures how well you are filling your lungs with each breath. Taking long deep breaths may help  reverse or decrease the chance of developing breathing (pulmonary) problems (especially infection) following: A long period of time when you are unable to move or be active. BEFORE THE PROCEDURE  If the spirometer includes an indicator to show your best effort, your nurse or respiratory therapist will set it to a desired goal. If possible, sit up straight or lean slightly forward. Try not to slouch. Hold the incentive spirometer in an upright position. INSTRUCTIONS FOR USE  Sit on the edge of your bed if possible, or sit up as far as you can in bed or on a chair. Hold the incentive spirometer in an upright position. Breathe out normally. Place the mouthpiece in your mouth and seal your lips tightly around it. Breathe in slowly and as deeply as possible, raising the piston or the ball toward the top of the column. Hold your breath for 3-5 seconds or for as long as possible. Allow the piston or ball to fall to the bottom of the column. Remove the mouthpiece from your mouth  and breathe out normally. Rest for a few seconds and repeat Steps 1 through 7 at least 10 times every 1-2 hours when you are awake. Take your time and take a few normal breaths between deep breaths. The spirometer may include an indicator to show your best effort. Use the indicator as a goal to work toward during each repetition. After each set of 10 deep breaths, practice coughing to be sure your lungs are clear. If you have an incision (the cut made at the time of surgery), support your incision when coughing by placing a pillow or rolled up towels firmly against it. Once you are able to get out of bed, walk around indoors and cough well. You may stop using the incentive spirometer when instructed by your caregiver.  RISKS AND COMPLICATIONS Take your time so you do not get dizzy or light-headed. If you are in pain, you may need to take or ask for pain medication before doing incentive spirometry. It is harder to take a deep  breath if you are having pain. AFTER USE Rest and breathe slowly and easily. It can be helpful to keep track of a log of your progress. Your caregiver can provide you with a simple table to help with this. If you are using the spirometer at home, follow these instructions: SEEK MEDICAL CARE IF:  You are having difficultly using the spirometer. You have trouble using the spirometer as often as instructed. Your pain medication is not giving enough relief while using the spirometer. You develop fever of 100.5 F (38.1 C) or higher. SEEK IMMEDIATE MEDICAL CARE IF:  You cough up bloody sputum that had not been present before. You develop fever of 102 F (38.9 C) or greater. You develop worsening pain at or near the incision site. MAKE SURE YOU:  Understand these instructions. Will watch your condition. Will get help right away if you are not doing well or get worse. Document Released: 05/04/2006 Document Revised: 03/16/2011 Document Reviewed: 07/05/2006 Exit[employer] Patient Information 2014 [employer], [place].   ________________________________________________________________________ WHAT IS A BLOOD TRANSFUSION? Blood Transfusion Information  A transfusion is the replacement of blood or some of its parts. Blood is made up of multiple cells which provide different functions. Red blood cells carry oxygen and are used for blood loss replacement. White blood cells fight against infection. Platelets control bleeding. Plasma helps clot blood. Other blood products are available for specialized needs, such as hemophilia or other clotting disorders. BEFORE THE TRANSFUSION  Who gives blood for transfusions?  Healthy volunteers who are fully evaluated to make sure their blood is safe. This is blood bank blood. Transfusion therapy is the safest it has ever been in the practice of medicine. Before blood is taken from a donor, a complete history is taken to make sure that person has no history of diseases  nor engages in risky social behavior (examples are intravenous drug use or sexual activity with multiple partners). The donor's travel history is screened to minimize risk of transmitting infections, such as malaria. The donated blood is tested for signs of infectious diseases, such as HIV and hepatitis. The blood is then tested to be sure it is compatible with you in order to minimize the chance of a transfusion reaction. If you or a relative donates blood, this is often done in anticipation of surgery and is not appropriate for emergency situations. It takes many days to process the donated blood. RISKS AND COMPLICATIONS Although transfusion therapy is very safe and saves many  lives, the main dangers of transfusion include:  Getting an infectious disease. Developing a transfusion reaction. This is an allergic reaction to something in the blood you were given. Every precaution is taken to prevent this. The decision to have a blood transfusion has been considered carefully by your caregiver before blood is given. Blood is not given unless the benefits outweigh the risks. AFTER THE TRANSFUSION Right after receiving a blood transfusion, you will usually feel much better and more energetic. This is especially true if your red blood cells have gotten low (anemic). The transfusion raises the level of the red blood cells which carry oxygen, and this usually causes an energy increase. The nurse administering the transfusion will monitor you carefully for complications. HOME CARE INSTRUCTIONS  No special instructions are needed after a transfusion. You may find your energy is better. Speak with your caregiver about any limitations on activity for underlying diseases you may have. SEEK MEDICAL CARE IF:  Your condition is not improving after your transfusion. You develop redness or irritation at the intravenous (IV) site. SEEK IMMEDIATE MEDICAL CARE IF:  Any of the following symptoms occur over the next 12  hours: Shaking chills. You have a temperature by mouth above 102 F (38.9 C), not controlled by medicine. Chest, back, or muscle pain. People around you feel you are not acting correctly or are confused. Shortness of breath or difficulty breathing. Dizziness and fainting. You get a rash or develop hives. You have a decrease in urine output. Your urine turns a dark color or changes to pink, red, or brown. Any of the following symptoms occur over the next 10 days: You have a temperature by mouth above 102 F (38.9 C), not controlled by medicine. Shortness of breath. Weakness after normal activity. The white part of the eye turns yellow (jaundice). You have a decrease in the amount of urine or are urinating less often. Your urine turns a dark color or changes to pink, red, or brown. Document Released: 12/20/1999 Document Revised: 03/16/2011 Document Reviewed: 08/08/2007 [facility] Patient Information 2014 [place], [place].  _______________________________________________________________________

## 2022-05-26 ENCOUNTER — Encounter (HOSPITAL_COMMUNITY)
Admission: RE | Admit: 2022-05-26 | Discharge: 2022-05-26 | Disposition: A | Discharge status: Home / Self Care | Payer: Medicare HMO | Source: Ambulatory Visit | Attending: Orthopaedic Surgery | Admitting: Orthopaedic Surgery

## 2022-05-26 ENCOUNTER — Encounter (HOSPITAL_COMMUNITY): Payer: Self-pay

## 2022-05-26 ENCOUNTER — Other Ambulatory Visit: Payer: Self-pay

## 2022-05-26 VITALS — BP 127/88 | HR 57 | Temp 98.0°F | Resp 17 | Ht 70.0 in | Wt 221.0 lb

## 2022-05-26 DIAGNOSIS — M1612 Unilateral primary osteoarthritis, left hip: Secondary | ICD-10-CM | POA: Diagnosis not present

## 2022-05-26 DIAGNOSIS — Z01818 Encounter for other preprocedural examination: Secondary | ICD-10-CM

## 2022-05-26 DIAGNOSIS — Z01812 Encounter for preprocedural laboratory examination: Secondary | ICD-10-CM | POA: Insufficient documentation

## 2022-05-26 DIAGNOSIS — E119 Type 2 diabetes mellitus without complications: Secondary | ICD-10-CM

## 2022-05-26 HISTORY — DX: Myoneural disorder, unspecified: G70.9

## 2022-05-26 HISTORY — DX: Unspecified osteoarthritis, unspecified site: M19.90

## 2022-05-26 LAB — CBC
HCT: 40.7 % (ref 39.0–52.0)
Hemoglobin: 13.4 g/dL (ref 13.0–17.0)
MCH: 30.8 pg (ref 26.0–34.0)
MCHC: 32.9 g/dL (ref 30.0–36.0)
MCV: 93.6 fL (ref 80.0–100.0)
Platelets: 204 10*3/uL (ref 150–400)
RBC: 4.35 MIL/uL (ref 4.22–5.81)
RDW: 13 % (ref 11.5–15.5)
WBC: 5.5 10*3/uL (ref 4.0–10.5)
nRBC: 0 % (ref 0.0–0.2)

## 2022-05-26 LAB — COMPREHENSIVE METABOLIC PANEL
ALT: 36 U/L (ref 0–44)
AST: 33 U/L (ref 15–41)
Albumin: 3.9 g/dL (ref 3.5–5.0)
Alkaline Phosphatase: 75 U/L (ref 38–126)
Anion gap: 8 (ref 5–15)
BUN: 18 mg/dL (ref 8–23)
CO2: 26 mmol/L (ref 22–32)
Calcium: 9.2 mg/dL (ref 8.9–10.3)
Chloride: 103 mmol/L (ref 98–111)
Creatinine, Ser: 0.92 mg/dL (ref 0.61–1.24)
GFR, Estimated: >60 mL/min (ref >60)
Glucose, Bld: 124 mg/dL — ABNORMAL HIGH (ref 70–99)
Potassium: 4.1 mmol/L (ref 3.5–5.1)
Sodium: 137 mmol/L (ref 135–145)
Total Bilirubin: 0.9 mg/dL (ref 0.3–1.2)
Total Protein: 7.2 g/dL (ref 6.5–8.1)

## 2022-05-26 LAB — TYPE AND SCREEN: Antibody Screen: NEGATIVE

## 2022-05-26 LAB — SURGICAL PCR SCREEN
MRSA, PCR: NEGATIVE
Staphylococcus aureus: NEGATIVE

## 2022-05-26 LAB — GLUCOSE, CAPILLARY: Glucose-Capillary: 127 mg/dL — ABNORMAL HIGH (ref 70–99)

## 2022-05-27 ENCOUNTER — Encounter: Payer: Self-pay | Admitting: Cardiology

## 2022-05-27 ENCOUNTER — Ambulatory Visit: Payer: Medicare HMO | Attending: Cardiology | Admitting: Cardiology

## 2022-05-27 VITALS — BP 126/72 | HR 66 | Ht 70.0 in | Wt 222.4 lb

## 2022-05-27 DIAGNOSIS — E785 Hyperlipidemia, unspecified: Secondary | ICD-10-CM | POA: Diagnosis not present

## 2022-05-27 DIAGNOSIS — I2510 Atherosclerotic heart disease of native coronary artery without angina pectoris: Secondary | ICD-10-CM | POA: Diagnosis not present

## 2022-05-27 DIAGNOSIS — R0989 Other specified symptoms and signs involving the circulatory and respiratory systems: Secondary | ICD-10-CM

## 2022-05-27 DIAGNOSIS — I10 Essential (primary) hypertension: Secondary | ICD-10-CM

## 2022-05-27 DIAGNOSIS — Z01818 Encounter for other preprocedural examination: Secondary | ICD-10-CM

## 2022-05-27 NOTE — Progress Notes (Signed)
Anesthesia chart review   Case: [id number] Date/Time: 05/29/22 0815   Procedure: LEFT TOTAL HIP ARTHROPLASTY ANTERIOR APPROACH (Left: Hip)   Anesthesia type: Spinal   Pre-op diagnosis: LEFT HIP OSTEOARTHRITIS   Location: [facility] ROOM 10 / [facility]   Surgeons: Kathryne Hitch, MD       DISCUSSION: 67 year old former smoker with history of GERD, HTN, DM, prostate cancer, B-cell lymphoma, left hip OA scheduled for above procedure 05/29/2022 with Dr. Doneen Poisson.  Patient last seen by cardiology 05/27/2022.  Per office visit note, "Good functional capacity, greater than 4 METS, denies any exertional chest pain or dyspnea. Coronary CTA 01/2020 showed nonobstructive CAD. Echocardiogram 10/2019 showed normal biventricular function, no significant valvular disease. No further cardiac workup recommended prior to procedure"  Anticipate pt can proceed with planned procedure barring acute status change.   VS: BP 127/88   Pulse (!) 57   Temp 36.7 C (Oral)   Resp 17   Ht 5\' 10"  (1.778 m)   Wt 100.2 kg   SpO2 97%   BMI 31.71 kg/m   PROVIDERS: Hannah Beat, MD is PCP  Epifanio Lesches, MD is Cardiologist  LABS: Labs reviewed: Acceptable for surgery. (all labs ordered are listed, but only abnormal results are displayed)  Labs Reviewed  COMPREHENSIVE METABOLIC PANEL - Abnormal; Notable for the following components:      Result Value   Glucose, Bld 124 (*)    All other components within normal limits  GLUCOSE, CAPILLARY - Abnormal; Notable for the following components:   Glucose-Capillary 127 (*)    All other components within normal limits  SURGICAL PCR SCREEN  CBC  TYPE AND SCREEN     IMAGES:   EKG:   CV: Echo 11/03/2019 1. Left ventricular ejection fraction, by estimation, is 70 to 75%. The  left ventricle has hyperdynamic function. The left ventricle has no  regional wall motion abnormalities. Left ventricular diastolic parameters  were normal.   2. Right  ventricular systolic function is normal. The right ventricular  size is normal. There is normal pulmonary artery systolic pressure.   3. The mitral valve is normal in structure. Trivial mitral valve  regurgitation. No evidence of mitral stenosis.   4. The aortic valve is tricuspid. Aortic valve regurgitation is not  visualized. No aortic stenosis is present.   5. The inferior vena cava is normal in size with greater than 50%  respiratory variability, suggesting right atrial pressure of 3 mmHg.   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   Neuromuscular disorder (HCC)    neuropathy feet   Prostate cancer (HCC) 04/02/2021   Tubular adenoma of colon    Ulcer 2004    Past Surgical History:  Procedure Laterality Date   ACHILLES TENDON REPAIR  2001   BICEPS TENDON REPAIR Left 2014   COLON SURGERY  2022   Polyps/ lymphoma tumor removed   COLONOSCOPY  2012   polyps-[place]   COSMETIC SURGERY  1995   Eye lids   HEMORROIDECTOMY  2012   ROTATOR CUFF REPAIR Left 2014   TOTAL HIP ARTHROPLASTY Right 12/07/2014   Procedure: RIGHT TOTAL HIP ARTHROPLASTY ANTERIOR APPROACH;  Surgeon: Kathryne Hitch, MD;  Location: [facility];  Service: Orthopedics;  Laterality: Right;    MEDICATIONS:  acetaminophen (TYLENOL) 500 MG tablet   Alcohol Swabs (B-D SINGLE USE SWABS REGULAR) PADS   aspirin EC 81 MG tablet   atorvastatin (LIPITOR) 80 MG tablet   celecoxib (CELEBREX) 200 MG capsule   cetirizine (ZYRTEC) 10 MG tablet   Cholecalciferol (VITAMIN D) 50 MCG (2000 UT) tablet   CLINPRO 5000 1.1 % PSTE   clobetasol ointment (TEMOVATE) 0.05 %   Cyanocobalamin (B-12) 5000 MCG CAPS   Flaxseed,  Linseed, (FLAX SEED OIL PO)   FLUoxetine (PROZAC) 20 MG capsule   fluticasone (FLONASE) 50 MCG/ACT nasal spray   gabapentin (NEURONTIN) 300 MG capsule   glucose blood (ONETOUCH VERIO) test strip   Lancets (ONETOUCH DELICA PLUS LANCET30G) MISC   LORazepam (ATIVAN) 0.5 MG tablet   losartan (COZAAR) 25 MG tablet   metFORMIN (GLUCOPHAGE-XR) 500 MG 24 hr tablet   Multiple Vitamins-Minerals (MULTIVITAMIN WITH MINERALS) tablet   mupirocin ointment (BACTROBAN) 2 %   Olopatadine HCl (PATADAY) 0.7 % SOLN   Polyethyl Glycol-Propyl Glycol (SYSTANE OP)   sildenafil (VIAGRA) 100 MG tablet   sulfamethoxazole-trimethoprim (BACTRIM DS) 800-160 MG tablet   No current facility-administered medications for this encounter.   Jodell Cipro Ward, PA-C [facility]Surgical [facility] [phone]

## 2022-05-27 NOTE — Progress Notes (Addendum)
Cardiology Office Note:    Date:  05/27/2022   ID:  Charles [name], DOB 1955/04/08, MRN [id number]  PCP:  Hannah Beat, MD  Cardiologist:  None  Electrophysiologist:  None   Referring MD: Hannah Beat, MD   Chief Complaint  Patient presents with   Coronary Artery Disease    History of Present Illness:    Charles [name] is a 67 y.o. male with a hx of T2DM, lymphoma who presents for follow-up.  He was referred by Dr. Patsy Lager for evaluation of CAD, initially seen on 10/27/2019.  He reports that he has been having intermittent chest pain, occurs a few times per month.  Describes sharp pain in the center of his chest that can last up to 1 minute.  Can occur at rest or with exertion.  Also reports getting short of breath with exertion.  Also has been having lightheadedness when he stands.  Denies any syncope, palpitations, or lower extremity edema.  Golfs 2-3 times per week for exercise, walks the course.  Smoked for 20 years, quit in 2002.  Family history includes father had MI at age 53, CVA in 45s.  Calcium score 155 (63rd percentile) on 10/03/2019.  Coronary CTA was done on 01/18/2020, which showed calcium score 165 (62nd percentile), nonobstructive CAD with mixed plaque in mid LAD causing 24 to 49% stenosis, calcified plaque in proximal LAD causing 0 to 24% stenosis, noncalcified plaque in proximal RCA causing 25 to 49% stenosis, calcified plaque in proximal and mid RCA causing 0 to 24% stenosis.  Echocardiogram on 11/03/2019 showed normal biventricular function, no significant valvular disease.  Since last clinic visit, he reports he is doing well.  Denies any chest pain, dyspnea,, lower extremity edema, or palpitations. Up to 6 weeks ago he was golfing, would walk course.  Over the last 6 weeks exercise has been limited due to hip pain.  Can walk up flight of stairs without stopping, denies any exertional symptoms.  Reports some lightheadedness but denies any syncope.   Past Medical  History:  Diagnosis Date   Arthritis    Controlled type 2 diabetes mellitus without complication, without long-term current use of insulin (HCC)    Diverticulitis    Generalized anxiety disorder 10/21/2014   GERD (gastroesophageal reflux disease)    Hyperlipidemia    Hypertension 06/2021   Marginal zone B-cell lymphoma (HCC) 06/10/2020   Neuromuscular disorder (HCC)    neuropathy feet   Prostate cancer (HCC) 04/02/2021   Tubular adenoma of colon    Ulcer 2004    Past Surgical History:  Procedure Laterality Date   ACHILLES TENDON REPAIR  2001   BICEPS TENDON REPAIR Left 2014   COLON SURGERY  2022   Polyps/ lymphoma tumor removed   COLONOSCOPY  2012   polyps-[place]   COSMETIC SURGERY  1995   Eye lids   HEMORROIDECTOMY  2012   ROTATOR CUFF REPAIR Left 2014   TOTAL HIP ARTHROPLASTY Right 12/07/2014   Procedure: RIGHT TOTAL HIP ARTHROPLASTY ANTERIOR APPROACH;  Surgeon: Kathryne Hitch, MD;  Location: [facility];  Service: Orthopedics;  Laterality: Right;    Current Medications: Current Meds  Medication Sig   acetaminophen (TYLENOL) 500 MG tablet Take 1,000 mg by mouth every 6 (six) hours as needed for moderate pain.   Alcohol Swabs (B-D SINGLE USE SWABS REGULAR) PADS Use to check blood sugar up to 2 times a day   aspirin EC 81 MG tablet Take 81 mg by mouth daily.  atorvastatin (LIPITOR) 80 MG tablet Take 1 tablet (80 mg total) by mouth daily.   celecoxib (CELEBREX) 200 MG capsule Take 1 capsule (200 mg total) by mouth daily.   cetirizine (ZYRTEC) 10 MG tablet Take 10 mg by mouth daily.   Cholecalciferol (VITAMIN D) 50 MCG (2000 UT) tablet Take 2,000 Units by mouth daily.   CLINPRO 5000 1.1 % PSTE Place 1 Application onto teeth daily.   clobetasol ointment (TEMOVATE) 0.05 % Apply 1 Application topically 2 (two) times daily. (Patient taking differently: Apply 1 Application topically 2 (two) times daily as needed (itching).)   Cyanocobalamin (B-12) 5000 MCG CAPS Take 5,000 mcg  by mouth daily.   Flaxseed, Linseed, (FLAX SEED OIL PO) Take 1 capsule by mouth daily.   FLUoxetine (PROZAC) 20 MG capsule TAKE 1 CAPSULE BY MOUTH EVERY DAY   fluticasone (FLONASE) 50 MCG/ACT nasal spray Place 2 sprays into both nostrils daily.   gabapentin (NEURONTIN) 300 MG capsule Take 2 capsules (600 mg total) by mouth at bedtime.   glucose blood (ONETOUCH VERIO) test strip TEST BLOOD SUGAR UP TO 2 TIMES DAILY   Lancets (ONETOUCH DELICA PLUS LANCET30G) MISC USE TO CHECK BLOOD SUGAR UP TO 2 TIMES A DAY.   LORazepam (ATIVAN) 0.5 MG tablet Take 1 tablet (0.5 mg total) by mouth every 8 (eight) hours as needed. for anxiety   losartan (COZAAR) 25 MG tablet TAKE 1 TABLET (25 MG TOTAL) BY MOUTH DAILY.   metFORMIN (GLUCOPHAGE-XR) 500 MG 24 hr tablet Take 4 tablets (2,000 mg total) by mouth daily with breakfast.   Multiple Vitamins-Minerals (MULTIVITAMIN WITH MINERALS) tablet Take 1 tablet by mouth daily.   mupirocin ointment (BACTROBAN) 2 % Place 1 Application into the nose 2 (two) times daily.   Olopatadine HCl (PATADAY) 0.7 % SOLN Place 1 drop into both eyes daily.   Polyethyl Glycol-Propyl Glycol (SYSTANE OP) Place 1 drop into both eyes as needed (dry eyes).   sildenafil (VIAGRA) 100 MG tablet Take 0.5-1 tablets (50-100 mg total) by mouth daily as needed for erectile dysfunction.   sulfamethoxazole-trimethoprim (BACTRIM DS) 800-160 MG tablet Take 1 tablet by mouth 2 (two) times daily.     Allergies:   Patient has no known allergies.   Social History   Socioeconomic History   Marital status: Married    Spouse name: Andrey Campanile   Number of children: 1   Years of education: Not on file   Highest education level: Bachelor's degree (e.g., BA, AB, BS)  Occupational History   Occupation: insurance adj retired  Tobacco Use   Smoking status: Former    Packs/day: 1.00    Years: 20.00    Additional pack years: 0.00    Total pack years: 20.00    Types: Cigarettes    Quit date: 01/06/2003    Years  since quitting: 19.4   Smokeless tobacco: Former    Types: Chew    Quit date: 01/06/2004   Tobacco comments:    Also used chewing tobacco formerly  Building services engineer Use: Never used  Substance and Sexual Activity   Alcohol use: Not Currently    Comment: Totally quit 2005   Drug use: No   Sexual activity: Yes    Partners: Female    Birth control/protection: None    Comment: Wife had tubal ligation  Other Topics Concern   Not on file  Social History Narrative   Married, Airline pilot  Are you right handed or left handed? Right Handed    Are you currently employed ? 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 No   Who lives with you? With Wife    What type of home do you live in: 1 story or 2 story? Two story home          Wife is a retired Magazine features editor Strain: Low Risk  (03/25/2022)   Overall Financial Resource Strain (CARDIA)    Difficulty of Paying Living Expenses: Not hard at all  Food Insecurity: No Food Insecurity (03/25/2022)   Hunger Vital Sign    Worried About Running Out of Food in the Last Year: Never true    Ran Out of Food in the Last Year: Never true  Transportation Needs: No Transportation Needs (03/25/2022)   PRAPARE - Administrator, Civil Service (Medical): No    Lack of Transportation (Non-Medical): No  Physical Activity: Sufficiently Active (03/25/2022)   Exercise Vital Sign    Days of Exercise per Week: 5 days    Minutes of Exercise per Session: 150+ min  Stress: No Stress Concern Present (03/25/2022)   [employer] of Occupational Health - Occupational Stress Questionnaire    Feeling of Stress : Not at all  Social Connections: Socially Integrated (03/25/2022)   Social Connection and Isolation Panel [NHANES]    Frequency of Communication with Friends and Family: More than three times a  week    Frequency of Social Gatherings with Friends and Family: More than three times a week    Attends Religious Services: More than 4 times per year    Active Member of [employer] or Organizations: Yes    Attends Engineer, structural: More than 4 times per year    Marital Status: Married     Family History: The patient's family history includes ADD / ADHD in his brother; Alcohol abuse in his brother, brother, and father; Anxiety disorder in his brother; Asthma in his sister; Breast cancer (age of onset: 63) in his mother; Cancer in his mother; Diabetes in his mother; Hearing loss in his father; Heart attack in his father; Heart disease in his father; Hyperlipidemia in his brother, father, and sister; Hypertension in his father and mother; Obesity in his brother, mother, and sister; Stroke in his father and mother. There is no history of Colon cancer, Colon polyps, Esophageal cancer, Rectal cancer, or Stomach cancer.  ROS:   Please see the history of present illness.     All other systems reviewed and are negative.  EKGs/Labs/Other Studies Reviewed:    The following studies were reviewed today:   EKG:   11/20/21: Normal sinus rhythm, rate 69, no ST abnormalities 05/27/22: NSR, rate 66, poor R wave progression, no ST abnormalities  Recent Labs: 11/14/2021: TSH 3.34 05/26/2022: ALT 36; BUN 18; Creatinine, Ser 0.92; Hemoglobin 13.4; Platelets 204; Potassium 4.1; Sodium 137  Recent Lipid Panel    Component Value Date/Time   CHOL 117 04/01/2022 0750   CHOL 142 02/01/2020 0929   TRIG 73.0 04/01/2022 0750   HDL 42.70 04/01/2022 0750   HDL 41 02/01/2020 0929   CHOLHDL 3 04/01/2022 0750   VLDL 14.6 04/01/2022 0750   LDLCALC 60 04/01/2022 0750   LDLCALC 69 02/01/2020 0929   LDLDIRECT 141.0 10/21/2016 4098  Physical Exam:    VS:  BP 126/72 (BP Location: Left Arm, Patient Position: Sitting, Cuff Size: Normal)   Pulse 66   Ht 5\' 10"  (1.778 m)   Wt 222 lb 6.4 oz (100.9 kg)    SpO2 95%   BMI 31.91 kg/m     Wt Readings from Last 3 Encounters:  05/27/22 222 lb 6.4 oz (100.9 kg)  05/26/22 221 lb (100.2 kg)  04/09/22 223 lb (101.2 kg)     GEN:  Well nourished, well developed in no acute distress HEENT: Normal NECK: No JVD; No carotid bruits CARDIAC: RRR, no murmurs, rubs, gallops RESPIRATORY:  Clear to auscultation without rales, wheezing or rhonchi  ABDOMEN: Soft, non-tender, non-distended MUSCULOSKELETAL:  No edema; No deformity  SKIN: Warm and dry NEUROLOGIC:  Alert and oriented x 3 PSYCHIATRIC:  Normal affect   ASSESSMENT:    1. Pre-op evaluation   2. Coronary artery disease involving native coronary artery of native heart without angina pectoris   3. Unequal blood pressure in upper extremities   4. Essential hypertension   5. Hyperlipidemia, unspecified hyperlipidemia type     PLAN:    Preop evaluation: Hip surgery planned 05/29/2022.  Good functional capacity, greater than 4 METS, denies any exertional chest pain or dyspnea.  Coronary CTA 01/2020 showed nonobstructive CAD.  Echocardiogram 10/2019 showed normal biventricular function, no significant valvular disease.  No further cardiac workup recommended prior to procedure  CAD: Calcium score 155 (63rd percentile) on 10/03/2019.  He reports atypical chest pain and exertional dyspnea.  Coronary CTA was done on 01/18/2020, which showed calcium score 165 (62nd percentile), nonobstructive CAD with mixed plaque in mid LAD causing 24 to 49% stenosis, calcified plaque in proximal LAD causing 0 to 24% stenosis, noncalcified plaque in proximal RCA causing 25 to 49% stenosis, calcified plaque in proximal and mid RCA causing 0 to 24% stenosis.  Echocardiogram on 11/03/2019 showed normal biventricular function, no significant valvular disease. -Continue atorvastatin 80 mg daily, LDL 60 on 04/01/22  T2DM: A1c 7.1% on 04/01/22.  On metformin  Hypertension: On losartan 25 mg daily.  Appears  controlled  Hyperlipidemia: Continue atorvastatin 80 mg daily,. LDL 60 on 04/01/22  Lightheadedness: Description suggest vertigo vs orthostasis, encouraged to stay hydrated.  Orthostatics in prior clinic visit showed drop in BP with standing but not meeting criteria for orthostatic hypotension.  -Denies any recent lightheadedness  Leg pain/numbness: normal ABIs on 11/08/19  Lymphoma: Status post immunotherapy, follows with hematology, in surveillance  Unequal blood pressure in upper extremities: Reports BP often 20 points lower in right arm.  Check upper extremity arterial duplex  RTC in 6 months  Medication Adjustments/Labs and Tests Ordered: Current medicines are reviewed at length with the patient today.  Concerns regarding medicines are outlined above.  Orders Placed This Encounter  Procedures   EKG 12-Lead   VAS [place] UPPER EXTREMITY ARTERIAL DUPLEX   No orders of the defined types were placed in this encounter.   Patient Instructions  Medication Instructions:  Your physician recommends that you continue on your current medications as directed. Please refer to the Current Medication list given to you today.   *If you need a refill on your cardiac medications before your next appointment, please call your pharmacy*  Testing/Procedures: Your physician has requested that you have a upper extremity arterial duplex. This test is an ultrasound of the arteries in the arms. It looks at arterial blood flow in the arms. Allow one hour for Upper Arterial scans.  There are no restrictions or special instructions  Follow-Up: At [facility], you and your health needs are our priority.  As part of our continuing mission to provide you with exceptional heart care, we have created designated Provider Care Teams.  These Care Teams include your primary Cardiologist (physician) and Advanced Practice Providers (APPs -  Physician Assistants and Nurse Practitioners) who all work together to  provide you with the care you need, when you need it.  We recommend signing up for the patient portal called "MyChart".  Sign up information is provided on this After Visit Summary.  MyChart is used to connect with patients for Virtual Visits (Telemedicine).  Patients are able to view lab/test results, encounter notes, upcoming appointments, etc.  Non-urgent messages can be sent to your provider as well.   To learn more about what you can do with MyChart, go to [email or web address].    Your next appointment:   6 month(s)  Provider:   Dr. Bjorn Pippin    Signed, Little Ishikawa, MD  05/27/2022 9:03 AM    [facility]

## 2022-05-27 NOTE — Patient Instructions (Signed)
Medication Instructions:  Your physician recommends that you continue on your current medications as directed. Please refer to the Current Medication list given to you today.   *If you need a refill on your cardiac medications before your next appointment, please call your pharmacy*  Testing/Procedures: Your physician has requested that you have a upper extremity arterial duplex. This test is an ultrasound of the arteries in the arms. It looks at arterial blood flow in the arms. Allow one hour for Upper Arterial scans. There are no restrictions or special instructions  Follow-Up: At [facility], you and your health needs are our priority.  As part of our continuing mission to provide you with exceptional heart care, we have created designated Provider Care Teams.  These Care Teams include your primary Cardiologist (physician) and Advanced Practice Providers (APPs -  Physician Assistants and Nurse Practitioners) who all work together to provide you with the care you need, when you need it.  We recommend signing up for the patient portal called "MyChart".  Sign up information is provided on this After Visit Summary.  MyChart is used to connect with patients for Virtual Visits (Telemedicine).  Patients are able to view lab/test results, encounter notes, upcoming appointments, etc.  Non-urgent messages can be sent to your provider as well.   To learn more about what you can do with MyChart, go to [email or web address].    Your next appointment:   6 month(s)  Provider:   Dr. Bjorn Pippin

## 2022-05-27 NOTE — Anesthesia Preprocedure Evaluation (Addendum)
Anesthesia Evaluation  Patient identified by MRN, date of birth, ID band Patient awake    Reviewed: Allergy & Precautions, NPO status , Patient's Chart, lab work & pertinent test results  History of Anesthesia Complications Negative for: history of anesthetic complications  Airway Mallampati: I  TM Distance: >3 FB Neck ROM: Full    Dental  (+) Dental Advisory Given   Pulmonary former smoker   breath sounds clear to auscultation       Cardiovascular hypertension, Pt. on medications (-) angina  Rhythm:Regular Rate:Normal  '21 ECHO: hyperdynamic LVF, EF 70-75%, normal RVF, trivial MR   Neuro/Psych   Anxiety     negative neurological ROS     GI/Hepatic Neg liver ROS,GERD  Controlled,,  Endo/Other  diabetes (glu 106), Oral Hypoglycemic Agents    Renal/GU negative Renal ROS     Musculoskeletal  (+) Arthritis ,    Abdominal   Peds  Hematology Lymphoma Hb 13.4, plt 204k   Anesthesia Other Findings H/o prostate cancer  Reproductive/Obstetrics                             Anesthesia Physical Anesthesia Plan  ASA: 3  Anesthesia Plan: Spinal   Post-op Pain Management: Tylenol PO (pre-op)*   Induction:   PONV Risk Score and Plan: 1 and Ondansetron and Treatment may vary due to age or medical condition  Airway Management Planned: Natural Airway and Simple Face Mask  Additional Equipment: None  Intra-op Plan:   Post-operative Plan:   Informed Consent: I have reviewed the patients History and Physical, chart, labs and discussed the procedure including the risks, benefits and alternatives for the proposed anesthesia with the patient or authorized representative who has indicated his/her understanding and acceptance.     Dental advisory given  Plan Discussed with: CRNA and Surgeon  Anesthesia Plan Comments: (See PAT note 05/26/2022)       Anesthesia Quick Evaluation

## 2022-05-28 ENCOUNTER — Telehealth: Payer: Self-pay | Admitting: *Deleted

## 2022-05-28 DIAGNOSIS — M1612 Unilateral primary osteoarthritis, left hip: Secondary | ICD-10-CM | POA: Insufficient documentation

## 2022-05-28 DIAGNOSIS — J320 Chronic maxillary sinusitis: Secondary | ICD-10-CM | POA: Diagnosis not present

## 2022-05-28 NOTE — Care Plan (Signed)
[facility] RNCM call to patient to discuss his upcoming Left total hip arthroplasty with Dr. Magnus Ivan on 05/29/22. He is an Ortho bundle patient through [facility] and is agreeable to case management. He has a wife who will be assisting at discharge. He will need a RW and 3in1/BSC after surgery. Anticipate HHPT will be needed after short hospital stay. Referral made to [facility] after choice provided. Reviewed all post op instructions with patient's wife. Will continue to follow.

## 2022-05-28 NOTE — H&P (Signed)
TOTAL HIP ADMISSION H&P  Patient is admitted for left total hip arthroplasty.  Subjective:  Chief Complaint: left hip pain  HPI: [name], 67 y.o. male, has a history of pain and functional disability in the left hip(s) due to arthritis and patient has failed non-surgical conservative treatments for greater than 12 weeks to include NSAID's and/or analgesics, flexibility and strengthening excercises, weight reduction as appropriate, and activity modification.  Onset of symptoms was gradual starting 2 years ago with gradually worsening course since that time.The patient noted no past surgery on the left hip(s).  Patient currently rates pain in the left hip at 10 out of 10 with activity. Patient has night pain, worsening of pain with activity and weight bearing, pain that interfers with activities of daily living, and pain with passive range of motion. Patient has evidence of subchondral sclerosis, periarticular osteophytes, and joint space narrowing by imaging studies. This condition presents safety issues increasing the risk of falls.  There is no current active infection.  Patient Active Problem List   Diagnosis Date Noted   Unilateral primary osteoarthritis, left hip 05/28/2022   Chronic sinusitis 03/20/2022   Hypertension, essential 01/15/2022   Prostate cancer (HCC) 04/02/2021   Marginal zone B-cell lymphoma (HCC) 06/10/2020   History of skin cancer 06/10/2020   Hyperlipidemia 10/21/2014   Generalized anxiety disorder 10/21/2014   GERD (gastroesophageal reflux disease)    Controlled type 2 diabetes mellitus without complication, without long-term current use of insulin (HCC)    Past Medical History:  Diagnosis Date   Arthritis    Controlled type 2 diabetes mellitus without complication, without long-term current use of insulin (HCC)    Diverticulitis    Generalized anxiety disorder 10/21/2014   GERD (gastroesophageal reflux disease)    Hyperlipidemia    Hypertension 06/2021    Marginal zone B-cell lymphoma (HCC) 06/10/2020   Neuromuscular disorder (HCC)    neuropathy feet   Prostate cancer (HCC) 04/02/2021   Tubular adenoma of colon    Ulcer 2004    Past Surgical History:  Procedure Laterality Date   ACHILLES TENDON REPAIR  2001   BICEPS TENDON REPAIR Left 2014   COLON SURGERY  2022   Polyps/ lymphoma tumor removed   COLONOSCOPY  2012   polyps-[place]   COSMETIC SURGERY  1995   Eye lids   HEMORROIDECTOMY  2012   ROTATOR CUFF REPAIR Left 2014   TOTAL HIP ARTHROPLASTY Right 12/07/2014   Procedure: RIGHT TOTAL HIP ARTHROPLASTY ANTERIOR APPROACH;  Surgeon: Kathryne Hitch, MD;  Location: [facility];  Service: Orthopedics;  Laterality: Right;    No current facility-administered medications for this encounter.   Current Outpatient Medications  Medication Sig Dispense Refill Last Dose   acetaminophen (TYLENOL) 500 MG tablet Take 1,000 mg by mouth every 6 (six) hours as needed for moderate pain.      aspirin EC 81 MG tablet Take 81 mg by mouth daily.      atorvastatin (LIPITOR) 80 MG tablet Take 1 tablet (80 mg total) by mouth daily. 90 tablet 3    cetirizine (ZYRTEC) 10 MG tablet Take 10 mg by mouth daily.      Cholecalciferol (VITAMIN D) 50 MCG (2000 UT) tablet Take 2,000 Units by mouth daily.      CLINPRO 5000 1.1 % PSTE Place 1 Application onto teeth daily.      clobetasol ointment (TEMOVATE) 0.05 % Apply 1 Application topically 2 (two) times daily. (Patient taking differently: Apply 1 Application topically 2 (  two) times daily as needed (itching).) 30 g 1    Cyanocobalamin (B-12) 5000 MCG CAPS Take 5,000 mcg by mouth daily.      Flaxseed, Linseed, (FLAX SEED OIL PO) Take 1 capsule by mouth daily.      FLUoxetine (PROZAC) 20 MG capsule TAKE 1 CAPSULE BY MOUTH EVERY DAY 30 capsule 5    fluticasone (FLONASE) 50 MCG/ACT nasal spray Place 2 sprays into both nostrils daily.      gabapentin (NEURONTIN) 300 MG capsule Take 2 capsules (600 mg total) by mouth at  bedtime. 180 capsule 1    LORazepam (ATIVAN) 0.5 MG tablet Take 1 tablet (0.5 mg total) by mouth every 8 (eight) hours as needed. for anxiety 30 tablet 1    losartan (COZAAR) 25 MG tablet TAKE 1 TABLET (25 MG TOTAL) BY MOUTH DAILY. 90 tablet 3    metFORMIN (GLUCOPHAGE-XR) 500 MG 24 hr tablet Take 4 tablets (2,000 mg total) by mouth daily with breakfast. 120 tablet 5    Multiple Vitamins-Minerals (MULTIVITAMIN WITH MINERALS) tablet Take 1 tablet by mouth daily.      mupirocin ointment (BACTROBAN) 2 % Place 1 Application into the nose 2 (two) times daily.      Olopatadine HCl (PATADAY) 0.7 % SOLN Place 1 drop into both eyes daily.      Polyethyl Glycol-Propyl Glycol (SYSTANE OP) Place 1 drop into both eyes as needed (dry eyes).      sulfamethoxazole-trimethoprim (BACTRIM DS) 800-160 MG tablet Take 1 tablet by mouth 2 (two) times daily.      Alcohol Swabs (B-D SINGLE USE SWABS REGULAR) PADS Use to check blood sugar up to 2 times a day 100 each 5    celecoxib (CELEBREX) 200 MG capsule Take 1 capsule (200 mg total) by mouth daily. 30 capsule 2    glucose blood (ONETOUCH VERIO) test strip TEST BLOOD SUGAR UP TO 2 TIMES DAILY 100 strip 5    Lancets (ONETOUCH DELICA PLUS LANCET30G) MISC USE TO CHECK BLOOD SUGAR UP TO 2 TIMES A DAY. 100 each 5    sildenafil (VIAGRA) 100 MG tablet Take 0.5-1 tablets (50-100 mg total) by mouth daily as needed for erectile dysfunction. 5 tablet 11    No Known Allergies  Social History   Tobacco Use   Smoking status: Former    Packs/day: 1.00    Years: 20.00    Additional pack years: 0.00    Total pack years: 20.00    Types: Cigarettes    Quit date: 01/06/2003    Years since quitting: 19.4   Smokeless tobacco: Former    Types: Chew    Quit date: 01/06/2004   Tobacco comments:    Also used chewing tobacco formerly  Substance Use Topics   Alcohol use: Not Currently    Comment: Totally quit 2005    Family History  Problem Relation Age of Onset   Alcohol abuse  Father    Heart attack Father    Hyperlipidemia Father    Stroke Father    Hypertension Father    Hearing loss Father    Heart disease Father    Breast cancer Mother 57   Hypertension Mother    Cancer Mother    Diabetes Mother    Obesity Mother    Stroke Mother    ADD / ADHD Brother    Alcohol abuse Brother    Anxiety disorder Brother    Hyperlipidemia Brother    Obesity Brother    Alcohol abuse Brother  Asthma Sister    Hyperlipidemia Sister    Obesity Sister    Colon cancer Neg Hx    Colon polyps Neg Hx    Esophageal cancer Neg Hx    Rectal cancer Neg Hx    Stomach cancer Neg Hx      Review of Systems  Objective:  Physical Exam Vitals reviewed.  Constitutional:      Appearance: Normal appearance.  HENT:     Head: Normocephalic and atraumatic.  Eyes:     Extraocular Movements: Extraocular movements intact.     Pupils: Pupils are equal, round, and reactive to light.  Cardiovascular:     Rate and Rhythm: Normal rate and regular rhythm.     Heart sounds: Normal heart sounds.  Pulmonary:     Effort: Pulmonary effort is normal.     Breath sounds: Normal breath sounds.  Abdominal:     Palpations: Abdomen is soft.  Musculoskeletal:     Cervical back: Normal range of motion and neck supple.     Left hip: Tenderness and bony tenderness present. Decreased range of motion. Decreased strength.  Neurological:     Mental Status: He is alert and oriented to person, place, and time.  Psychiatric:        Behavior: Behavior normal.     Vital signs in last 24 hours:    Labs:   Estimated body mass index is 31.91 kg/m as calculated from the following:   Height as of 05/27/22: 5\' 10"  (1.778 m).   Weight as of 05/27/22: 100.9 kg.   Imaging Review Plain radiographs demonstrate severe degenerative joint disease of the left hip(s). The bone quality appears to be excellent for age and reported activity level.      Assessment/Plan:  End stage arthritis, left  hip(s)  The patient history, physical examination, clinical judgement of the provider and imaging studies are consistent with end stage degenerative joint disease of the left hip(s) and total hip arthroplasty is deemed medically necessary. The treatment options including medical management, injection therapy, arthroscopy and arthroplasty were discussed at length. The risks and benefits of total hip arthroplasty were presented and reviewed. The risks due to aseptic loosening, infection, stiffness, dislocation/subluxation,  thromboembolic complications and other imponderables were discussed.  The patient acknowledged the explanation, agreed to proceed with the plan and consent was signed. Patient is being admitted for inpatient treatment for surgery, pain control, PT, OT, prophylactic antibiotics, VTE prophylaxis, progressive ambulation and ADL's and discharge planning.The patient is planning to be discharged home with home health services

## 2022-05-28 NOTE — Telephone Encounter (Signed)
Ortho bundle Pre-op call completed. 

## 2022-05-29 ENCOUNTER — Ambulatory Visit (HOSPITAL_BASED_OUTPATIENT_CLINIC_OR_DEPARTMENT_OTHER): Payer: Medicare HMO | Admitting: Certified Registered Nurse Anesthetist

## 2022-05-29 ENCOUNTER — Other Ambulatory Visit: Payer: Self-pay

## 2022-05-29 ENCOUNTER — Encounter (HOSPITAL_COMMUNITY): Payer: Self-pay | Admitting: Orthopaedic Surgery

## 2022-05-29 ENCOUNTER — Ambulatory Visit (HOSPITAL_COMMUNITY): Payer: Medicare HMO

## 2022-05-29 ENCOUNTER — Ambulatory Visit (HOSPITAL_COMMUNITY): Payer: Medicare HMO | Admitting: Physician Assistant

## 2022-05-29 ENCOUNTER — Observation Stay (HOSPITAL_COMMUNITY)
Admission: RE | Admit: 2022-05-29 | Discharge: 2022-05-30 | Disposition: A | Discharge status: Home Health | Payer: Medicare HMO | Attending: Orthopaedic Surgery | Admitting: Orthopaedic Surgery

## 2022-05-29 ENCOUNTER — Encounter (HOSPITAL_COMMUNITY)
Admission: RE | Disposition: A | Discharge status: Home Health | Payer: Self-pay | Source: Home / Self Care | Attending: Orthopaedic Surgery

## 2022-05-29 ENCOUNTER — Observation Stay (HOSPITAL_COMMUNITY): Payer: Medicare HMO

## 2022-05-29 DIAGNOSIS — I10 Essential (primary) hypertension: Secondary | ICD-10-CM | POA: Insufficient documentation

## 2022-05-29 DIAGNOSIS — M1612 Unilateral primary osteoarthritis, left hip: Secondary | ICD-10-CM | POA: Diagnosis not present

## 2022-05-29 DIAGNOSIS — Z96641 Presence of right artificial hip joint: Secondary | ICD-10-CM | POA: Diagnosis not present

## 2022-05-29 DIAGNOSIS — Z7952 Long term (current) use of systemic steroids: Secondary | ICD-10-CM | POA: Insufficient documentation

## 2022-05-29 DIAGNOSIS — Z87891 Personal history of nicotine dependence: Secondary | ICD-10-CM | POA: Diagnosis not present

## 2022-05-29 DIAGNOSIS — Z79899 Other long term (current) drug therapy: Secondary | ICD-10-CM | POA: Diagnosis not present

## 2022-05-29 DIAGNOSIS — Z96642 Presence of left artificial hip joint: Secondary | ICD-10-CM

## 2022-05-29 DIAGNOSIS — J329 Chronic sinusitis, unspecified: Secondary | ICD-10-CM | POA: Diagnosis not present

## 2022-05-29 DIAGNOSIS — Z471 Aftercare following joint replacement surgery: Secondary | ICD-10-CM | POA: Diagnosis not present

## 2022-05-29 DIAGNOSIS — Z7984 Long term (current) use of oral hypoglycemic drugs: Secondary | ICD-10-CM | POA: Diagnosis not present

## 2022-05-29 DIAGNOSIS — E119 Type 2 diabetes mellitus without complications: Secondary | ICD-10-CM

## 2022-05-29 DIAGNOSIS — Z7982 Long term (current) use of aspirin: Secondary | ICD-10-CM | POA: Insufficient documentation

## 2022-05-29 DIAGNOSIS — Z8546 Personal history of malignant neoplasm of prostate: Secondary | ICD-10-CM | POA: Insufficient documentation

## 2022-05-29 HISTORY — PX: TOTAL HIP ARTHROPLASTY: SHX124

## 2022-05-29 LAB — TYPE AND SCREEN: ABO/RH(D): O POS

## 2022-05-29 LAB — GLUCOSE, CAPILLARY
Glucose-Capillary: 106 mg/dL — ABNORMAL HIGH (ref 70–99)
Glucose-Capillary: 102 mg/dL — ABNORMAL HIGH (ref 70–99)

## 2022-05-29 SURGERY — ARTHROPLASTY, HIP, TOTAL, ANTERIOR APPROACH
Anesthesia: Spinal | Site: Hip | Laterality: Left

## 2022-05-29 MED ORDER — HYDROMORPHONE HCL 1 MG/ML IJ SOLN
INTRAMUSCULAR | Status: AC
  Filled 2022-05-29: qty 1

## 2022-05-29 MED ORDER — TRANEXAMIC ACID-NACL 1000-0.7 MG/100ML-% IV SOLN
INTRAVENOUS | Status: AC
  Filled 2022-05-29: qty 100

## 2022-05-29 MED ORDER — MEPERIDINE HCL 50 MG/ML IJ SOLN: 6.2500 mg | INTRAMUSCULAR | Status: DC | PRN

## 2022-05-29 MED ORDER — OXYCODONE HCL 5 MG/5ML PO SOLN: 5.0000 mg | Freq: Once | ORAL | Status: DC | PRN

## 2022-05-29 MED ORDER — MIDAZOLAM HCL 2 MG/2ML IJ SOLN: 0.5000 mg | Freq: Once | INTRAMUSCULAR | Status: DC | PRN

## 2022-05-29 MED ORDER — METHOCARBAMOL 500 MG IVPB - SIMPLE MED: 500.0000 mg | Freq: Four times a day (QID) | INTRAVENOUS | Status: DC | PRN

## 2022-05-29 MED ORDER — CHLORHEXIDINE GLUCONATE 0.12 % MT SOLN
15.0000 mL | Freq: Once | OROMUCOSAL | Status: AC
  Administered 2022-05-29: 15 mL via OROMUCOSAL

## 2022-05-29 MED ORDER — SODIUM CHLORIDE 0.9 % IV SOLN: INTRAVENOUS | Status: DC

## 2022-05-29 MED ORDER — TRANEXAMIC ACID-NACL 1000-0.7 MG/100ML-% IV SOLN
1000.0000 mg | INTRAVENOUS | Status: AC
  Administered 2022-05-29: 1000 mg via INTRAVENOUS

## 2022-05-29 MED ORDER — PHENOL 1.4 % MT LIQD: 1.0000 | OROMUCOSAL | Status: DC | PRN

## 2022-05-29 MED ORDER — FLUOXETINE HCL 20 MG PO CAPS
20.0000 mg | ORAL_CAPSULE | Freq: Every day | ORAL | Status: DC
  Administered 2022-05-30: 20 mg via ORAL
  Filled 2022-05-29 (×2): qty 1

## 2022-05-29 MED ORDER — ACETAMINOPHEN 325 MG PO TABS: 325.0000 mg | ORAL_TABLET | Freq: Four times a day (QID) | ORAL | Status: DC | PRN

## 2022-05-29 MED ORDER — DOCUSATE SODIUM 100 MG PO CAPS
100.0000 mg | ORAL_CAPSULE | Freq: Two times a day (BID) | ORAL | Status: DC
  Administered 2022-05-29 – 2022-05-30 (×2): 100 mg via ORAL
  Filled 2022-05-29 (×2): qty 1

## 2022-05-29 MED ORDER — ALUM & MAG HYDROXIDE-SIMETH 200-200-20 MG/5ML PO SUSP: 30.0000 mL | ORAL | Status: DC | PRN

## 2022-05-29 MED ORDER — METFORMIN HCL ER 500 MG PO TB24
2000.0000 mg | ORAL_TABLET | Freq: Every day | ORAL | Status: DC
  Administered 2022-05-30: 2000 mg via ORAL
  Filled 2022-05-29 (×2): qty 4

## 2022-05-29 MED ORDER — ONDANSETRON HCL 4 MG/2ML IJ SOLN
INTRAMUSCULAR | Status: DC | PRN
  Administered 2022-05-29: 4 mg via INTRAVENOUS

## 2022-05-29 MED ORDER — ASPIRIN 81 MG PO CHEW
81.0000 mg | CHEWABLE_TABLET | Freq: Two times a day (BID) | ORAL | Status: DC
  Administered 2022-05-29 – 2022-05-30 (×2): 81 mg via ORAL
  Filled 2022-05-29 (×2): qty 1

## 2022-05-29 MED ORDER — CEFAZOLIN SODIUM-DEXTROSE 2-4 GM/100ML-% IV SOLN
2.0000 g | INTRAVENOUS | Status: AC
  Administered 2022-05-29: 2 g via INTRAVENOUS

## 2022-05-29 MED ORDER — MENTHOL 3 MG MT LOZG: 1.0000 | LOZENGE | OROMUCOSAL | Status: DC | PRN

## 2022-05-29 MED ORDER — LOSARTAN POTASSIUM 25 MG PO TABS
25.0000 mg | ORAL_TABLET | Freq: Every day | ORAL | Status: DC
  Administered 2022-05-30: 25 mg via ORAL
  Filled 2022-05-29: qty 1

## 2022-05-29 MED ORDER — OXYCODONE HCL 5 MG PO TABS: 5.0000 mg | ORAL_TABLET | Freq: Once | ORAL | Status: DC | PRN

## 2022-05-29 MED ORDER — ONDANSETRON HCL 4 MG/2ML IJ SOLN: 4.0000 mg | Freq: Four times a day (QID) | INTRAMUSCULAR | Status: DC | PRN

## 2022-05-29 MED ORDER — OXYCODONE HCL 5 MG PO TABS
5.0000 mg | ORAL_TABLET | ORAL | Status: DC | PRN
  Administered 2022-05-29: 5 mg via ORAL
  Administered 2022-05-30: 10 mg via ORAL
  Filled 2022-05-29: qty 1
  Filled 2022-05-29: qty 2

## 2022-05-29 MED ORDER — ACETAMINOPHEN 500 MG PO TABS: 1000.0000 mg | ORAL_TABLET | Freq: Once | ORAL | Status: DC

## 2022-05-29 MED ORDER — FENTANYL CITRATE (PF) 100 MCG/2ML IJ SOLN
INTRAMUSCULAR | Status: DC | PRN
  Administered 2022-05-29: 25 ug via INTRAVENOUS

## 2022-05-29 MED ORDER — FENTANYL CITRATE (PF) 100 MCG/2ML IJ SOLN
INTRAMUSCULAR | Status: AC
  Filled 2022-05-29: qty 2

## 2022-05-29 MED ORDER — METHOCARBAMOL 500 MG PO TABS
500.0000 mg | ORAL_TABLET | Freq: Four times a day (QID) | ORAL | Status: DC | PRN
  Administered 2022-05-29 – 2022-05-30 (×3): 500 mg via ORAL
  Filled 2022-05-29 (×3): qty 1

## 2022-05-29 MED ORDER — VITAMIN D3 25 MCG (1000 UNIT) PO TABS
2000.0000 [IU] | ORAL_TABLET | Freq: Every day | ORAL | Status: DC
  Administered 2022-05-30: 2000 [IU] via ORAL
  Filled 2022-05-29 (×2): qty 2

## 2022-05-29 MED ORDER — VITAMIN B-12 1000 MCG PO TABS
5000.0000 ug | ORAL_TABLET | Freq: Every day | ORAL | Status: DC
  Administered 2022-05-30: 5000 ug via ORAL
  Filled 2022-05-29: qty 5

## 2022-05-29 MED ORDER — LORAZEPAM 0.5 MG PO TABS: 0.5000 mg | ORAL_TABLET | Freq: Three times a day (TID) | ORAL | Status: DC | PRN

## 2022-05-29 MED ORDER — LACTATED RINGERS IV SOLN: INTRAVENOUS | Status: DC

## 2022-05-29 MED ORDER — SODIUM CHLORIDE 0.9 % IR SOLN
Status: DC | PRN
  Administered 2022-05-29: 1000 mL

## 2022-05-29 MED ORDER — PROPOFOL 1000 MG/100ML IV EMUL
INTRAVENOUS | Status: AC
  Filled 2022-05-29: qty 100

## 2022-05-29 MED ORDER — ONDANSETRON HCL 4 MG PO TABS: 4.0000 mg | ORAL_TABLET | Freq: Four times a day (QID) | ORAL | Status: DC | PRN

## 2022-05-29 MED ORDER — METOCLOPRAMIDE HCL 5 MG PO TABS: 5.0000 mg | ORAL_TABLET | Freq: Three times a day (TID) | ORAL | Status: DC | PRN

## 2022-05-29 MED ORDER — BUPIVACAINE IN DEXTROSE 0.75-8.25 % IT SOLN
INTRATHECAL | Status: DC | PRN
  Administered 2022-05-29: 13.5 mg via INTRATHECAL

## 2022-05-29 MED ORDER — ACETAMINOPHEN 500 MG PO TABS
ORAL_TABLET | ORAL | Status: AC
  Filled 2022-05-29: qty 2

## 2022-05-29 MED ORDER — PROPOFOL 10 MG/ML IV BOLUS
INTRAVENOUS | Status: DC | PRN
  Administered 2022-05-29: 20 mg via INTRAVENOUS

## 2022-05-29 MED ORDER — PROPOFOL 500 MG/50ML IV EMUL
INTRAVENOUS | Status: DC | PRN
  Administered 2022-05-29: 75 ug/kg/min via INTRAVENOUS

## 2022-05-29 MED ORDER — PANTOPRAZOLE SODIUM 40 MG PO TBEC
40.0000 mg | DELAYED_RELEASE_TABLET | Freq: Every day | ORAL | Status: DC
  Administered 2022-05-29 – 2022-05-30 (×2): 40 mg via ORAL
  Filled 2022-05-29 (×2): qty 1

## 2022-05-29 MED ORDER — HYDROMORPHONE HCL 1 MG/ML IJ SOLN
0.2500 mg | INTRAMUSCULAR | Status: DC | PRN
  Administered 2022-05-29: 0.5 mg via INTRAVENOUS

## 2022-05-29 MED ORDER — ONDANSETRON HCL 4 MG/2ML IJ SOLN
INTRAMUSCULAR | Status: AC
  Filled 2022-05-29: qty 2

## 2022-05-29 MED ORDER — METOCLOPRAMIDE HCL 5 MG/ML IJ SOLN: 5.0000 mg | Freq: Three times a day (TID) | INTRAMUSCULAR | Status: DC | PRN

## 2022-05-29 MED ORDER — MIDAZOLAM HCL 5 MG/5ML IJ SOLN
INTRAMUSCULAR | Status: DC | PRN
  Administered 2022-05-29: 2 mg via INTRAVENOUS

## 2022-05-29 MED ORDER — ORAL CARE MOUTH RINSE: 15.0000 mL | Freq: Once | OROMUCOSAL | Status: AC

## 2022-05-29 MED ORDER — CEFAZOLIN SODIUM-DEXTROSE 1-4 GM/50ML-% IV SOLN
1.0000 g | Freq: Four times a day (QID) | INTRAVENOUS | Status: AC
  Administered 2022-05-29 (×2): 1 g via INTRAVENOUS
  Filled 2022-05-29 (×2): qty 50

## 2022-05-29 MED ORDER — DEXAMETHASONE SODIUM PHOSPHATE 10 MG/ML IJ SOLN
INTRAMUSCULAR | Status: AC
  Filled 2022-05-29: qty 1

## 2022-05-29 MED ORDER — HYDROMORPHONE HCL 1 MG/ML IJ SOLN
0.5000 mg | INTRAMUSCULAR | Status: DC | PRN
  Administered 2022-05-29: 0.5 mg via INTRAVENOUS
  Administered 2022-05-29: 1 mg via INTRAVENOUS
  Filled 2022-05-29 (×3): qty 1

## 2022-05-29 MED ORDER — ATORVASTATIN CALCIUM 40 MG PO TABS
80.0000 mg | ORAL_TABLET | Freq: Every day | ORAL | Status: DC
  Administered 2022-05-29: 80 mg via ORAL
  Filled 2022-05-29 (×2): qty 2

## 2022-05-29 MED ORDER — OLOPATADINE HCL 0.1 % OP SOLN
1.0000 [drp] | Freq: Every day | OPHTHALMIC | Status: DC
  Administered 2022-05-29 – 2022-05-30 (×2): 1 [drp] via OPHTHALMIC
  Filled 2022-05-29: qty 5

## 2022-05-29 MED ORDER — 0.9 % SODIUM CHLORIDE (POUR BTL) OPTIME
TOPICAL | Status: DC | PRN
  Administered 2022-05-29: 1000 mL

## 2022-05-29 MED ORDER — CEFAZOLIN SODIUM-DEXTROSE 2-4 GM/100ML-% IV SOLN
INTRAVENOUS | Status: AC
  Filled 2022-05-29: qty 100

## 2022-05-29 MED ORDER — PROMETHAZINE HCL 25 MG/ML IJ SOLN: 6.2500 mg | INTRAMUSCULAR | Status: DC | PRN

## 2022-05-29 MED ORDER — GABAPENTIN 300 MG PO CAPS
600.0000 mg | ORAL_CAPSULE | Freq: Every day | ORAL | Status: DC
  Administered 2022-05-29: 600 mg via ORAL
  Filled 2022-05-29: qty 2

## 2022-05-29 MED ORDER — POVIDONE-IODINE 10 % EX SWAB: 2.0000 | Freq: Once | CUTANEOUS | Status: DC

## 2022-05-29 MED ORDER — MIDAZOLAM HCL 2 MG/2ML IJ SOLN
INTRAMUSCULAR | Status: AC
  Filled 2022-05-29: qty 2

## 2022-05-29 MED ORDER — OXYCODONE HCL 5 MG PO TABS
10.0000 mg | ORAL_TABLET | ORAL | Status: DC | PRN
  Administered 2022-05-29: 10 mg via ORAL
  Administered 2022-05-29: 15 mg via ORAL
  Administered 2022-05-30 (×2): 10 mg via ORAL
  Filled 2022-05-29: qty 3
  Filled 2022-05-29 (×3): qty 2

## 2022-05-29 SURGICAL SUPPLY — 43 items
ACETAB CUP W/GRIPTION 54 (Plate) ×1 IMPLANT
APL SKNCLS STERI-STRIP NONHPOA (GAUZE/BANDAGES/DRESSINGS)
BAG COUNTER SPONGE SURGICOUNT (BAG) ×1 IMPLANT
BAG SPEC THK2 15X12 ZIP CLS (MISCELLANEOUS) ×1
BAG SPNG CNTER NS LX DISP (BAG) ×1
BAG ZIPLOCK 12X15 (MISCELLANEOUS) IMPLANT
BENZOIN TINCTURE PRP APPL 2/3 (GAUZE/BANDAGES/DRESSINGS) IMPLANT
BLADE SAW SGTL 18X1.27X75 (BLADE) ×1 IMPLANT
COVER PERINEAL POST (MISCELLANEOUS) ×1 IMPLANT
COVER SURGICAL LIGHT HANDLE (MISCELLANEOUS) ×1 IMPLANT
CUP ACETAB W/GRIPTION 54 (Plate) IMPLANT
DRAPE FOOT SWITCH (DRAPES) ×1 IMPLANT
DRAPE STERI IOBAN 125X83 (DRAPES) ×1 IMPLANT
DRAPE U-SHAPE 47X51 STRL (DRAPES) ×2 IMPLANT
DRSG AQUACEL AG ADV 3.5X10 (GAUZE/BANDAGES/DRESSINGS) ×1 IMPLANT
DURAPREP 26ML APPLICATOR (WOUND CARE) ×1 IMPLANT
ELECT REM PT RETURN 15FT ADLT (MISCELLANEOUS) ×1 IMPLANT
GAUZE XEROFORM 1X8 LF (GAUZE/BANDAGES/DRESSINGS) ×1 IMPLANT
GLOVE BIO SURGEON STRL SZ7.5 (GLOVE) ×1 IMPLANT
GLOVE BIOGEL PI IND STRL 8 (GLOVE) ×2 IMPLANT
GLOVE ECLIPSE 8.0 STRL XLNG CF (GLOVE) ×1 IMPLANT
GOWN STRL REUS W/ TWL XL LVL3 (GOWN DISPOSABLE) ×2 IMPLANT
GOWN STRL REUS W/TWL XL LVL3 (GOWN DISPOSABLE) ×2
HANDPIECE INTERPULSE COAX TIP (DISPOSABLE) ×1
HEAD CERAMIC DELTA 36 PLUS 1.5 (Hips) IMPLANT
HOLDER FOLEY CATH W/STRAP (MISCELLANEOUS) ×1 IMPLANT
KIT TURNOVER KIT A (KITS) IMPLANT
LINER NEUTRAL 54X36MM PLUS 4 (Hips) IMPLANT
PACK ANTERIOR HIP CUSTOM (KITS) ×1 IMPLANT
SET HNDPC FAN SPRY TIP SCT (DISPOSABLE) ×1 IMPLANT
SLEEVE SCD COMPRESS KNEE MED (STOCKING) IMPLANT
STAPLER VISISTAT 35W (STAPLE) IMPLANT
STEM CORAIL KA12 (Stem) IMPLANT
STRIP CLOSURE SKIN 1/2X4 (GAUZE/BANDAGES/DRESSINGS) IMPLANT
SUT ETHIBOND NAB CT1 #1 30IN (SUTURE) ×1 IMPLANT
SUT ETHILON 2 0 PS N (SUTURE) IMPLANT
SUT MNCRL AB 4-0 PS2 18 (SUTURE) IMPLANT
SUT VIC AB 0 CT1 36 (SUTURE) ×1 IMPLANT
SUT VIC AB 1 CT1 36 (SUTURE) ×1 IMPLANT
SUT VIC AB 2-0 CT1 27 (SUTURE) ×1
SUT VIC AB 2-0 CT1 TAPERPNT 27 (SUTURE) ×2 IMPLANT
TRAY FOLEY MTR SLVR 16FR STAT (SET/KITS/TRAYS/PACK) IMPLANT
YANKAUER SUCT BULB TIP NO VENT (SUCTIONS) ×1 IMPLANT

## 2022-05-29 NOTE — Anesthesia Postprocedure Evaluation (Signed)
Anesthesia Post Note  Patient: Charles [name]  Procedure(s) Performed: LEFT TOTAL HIP ARTHROPLASTY ANTERIOR APPROACH (Left: Hip)     Patient location during evaluation: PACU Anesthesia Type: Spinal Level of consciousness: oriented, awake and alert and patient cooperative Pain management: pain level controlled Vital Signs Assessment: post-procedure vital signs reviewed and stable Respiratory status: spontaneous breathing, respiratory function stable and nonlabored ventilation Cardiovascular status: blood pressure returned to baseline and stable Postop Assessment: no headache, no backache, no apparent nausea or vomiting and spinal receding (pt is wiggling toes) Anesthetic complications: no   No notable events documented.  Last Vitals:  Vitals:   05/29/22 1045 05/29/22 1100  BP: 119/71 120/83  Pulse: (!) 50 (!) 51  Resp: 10 18  Temp:    SpO2: 99% 96%    Last Pain:  Vitals:   05/29/22 1100  TempSrc:   PainSc: 5                  Hannahgrace Lalli,E. Hakop Humbarger

## 2022-05-29 NOTE — Evaluation (Signed)
Physical Therapy Evaluation Patient Details Name: Charles [name] MRN: [id number] DOB: [birth year] Today's Date: 05/29/2022  History of Present Illness  Pt s/p L THR and with hx of R THR and neuropathy bil feet  Clinical Impression  Pt s/p L THR and presents with decreased L LE strength/ROM and post op pain limiting functional mobility.  Pt should progress to dc home with family assist.     Recommendations for follow up therapy are one component of a multi-disciplinary discharge planning process, led by the attending physician.  Recommendations may be updated based on patient status, additional functional criteria and insurance authorization.  Follow Up Recommendations       Assistance Recommended at Discharge Intermittent Supervision/Assistance  Patient can return home with the following       Equipment Recommendations Rolling walker (2 wheels);BSC/3in1  Recommendations for Other Services       Functional Status Assessment Patient has had a recent decline in their functional status and demonstrates the ability to make significant improvements in function in a reasonable and predictable amount of time.     Precautions / Restrictions Precautions Precautions: Fall Restrictions Weight Bearing Restrictions: No      Mobility  Bed Mobility Overal bed mobility: Needs Assistance Bed Mobility: Supine to Sit, Sit to Supine     Supine to sit: Min assist Sit to supine: Min assist, Mod assist   General bed mobility comments: Increased time with cues for sequence and use of R LE to self assist    Transfers Overall transfer level: Needs assistance Equipment used: Rolling walker (2 wheels) Transfers: Sit to/from Stand Sit to Stand: Min assist           General transfer comment: cues for LE management and use of UEs to self assist    Ambulation/Gait Ambulation/Gait assistance: Min assist Gait Distance (Feet): 38 Feet Assistive device: Rolling walker (2 wheels) Gait  Pattern/deviations: Step-to pattern Gait velocity: decr     General Gait Details: cues for sequence, posture and position from [employer]            Wheelchair Mobility    Modified Rankin (Stroke Patients Only)       Balance Overall balance assessment: Needs assistance Sitting-balance support: No upper extremity supported, Feet supported Sitting balance-Leahy Scale: Fair     Standing balance support: Single extremity supported Standing balance-Leahy Scale: Poor                               Pertinent Vitals/Pain Pain Assessment Pain Assessment: 0-10 Pain Score: 3  Pain Location: L hip Pain Descriptors / Indicators: Aching, Sore Pain Intervention(s): Limited activity within patient's tolerance, Monitored during session, Premedicated before session, Ice applied    Home Living Family/patient expects to be discharged to:: Private residence Living Arrangements: Spouse/significant other Available Help at Discharge: Family Type of Home: House Home Access: Stairs to enter Entrance Stairs-Rails: Right;Left;Can reach both Entrance Stairs-Number of Steps: 4 Alternate Level Stairs-Number of Steps: 16 Home Layout: Two level;Able to live on main level with bedroom/bathroom Home Equipment: Gilmer Mor - single point      Prior Function Prior Level of Function : Independent/Modified Independent                     Hand Dominance        Extremity/Trunk Assessment   Upper Extremity Assessment Upper Extremity Assessment: Overall WFL for tasks assessed    Lower  Extremity Assessment Lower Extremity Assessment: LLE deficits/detail    Cervical / Trunk Assessment Cervical / Trunk Assessment: Normal  Communication   Communication: No difficulties  Cognition Arousal/Alertness: Awake/alert Behavior During Therapy: WFL for tasks assessed/performed Overall Cognitive Status: Within Functional Limits for tasks assessed                                           General Comments      Exercises Total Joint Exercises Ankle Circles/Pumps: AROM, Both, 15 reps, Supine   Assessment/Plan    PT Assessment Patient needs continued PT services  PT Problem List Decreased strength;Decreased range of motion;Decreased activity tolerance;Decreased balance;Decreased mobility;Decreased knowledge of use of DME;Pain       PT Treatment Interventions DME instruction;Gait training;Stair training;Functional mobility training;Therapeutic activities;Therapeutic exercise;Patient/family education    PT Goals (Current goals can be found in the Care Plan section)  Acute Rehab PT Goals Patient Stated Goal: REgain IND PT Goal Formulation: With patient Time For Goal Achievement: 06/05/22 Potential to Achieve Goals: Good    Frequency 7X/week     Co-evaluation               AM-PAC PT "6 Clicks" Mobility  Outcome Measure Help needed turning from your back to your side while in a flat bed without using bedrails?: A Little Help needed moving from lying on your back to sitting on the side of a flat bed without using bedrails?: A Little Help needed moving to and from a bed to a chair (including a wheelchair)?: A Little Help needed standing up from a chair using your arms (e.g., wheelchair or bedside chair)?: A Little Help needed to walk in hospital room?: A Little Help needed climbing 3-5 steps with a railing? : A Lot 6 Click Score: 17    End of Session Equipment Utilized During Treatment: Gait belt Activity Tolerance: Patient tolerated treatment well Patient left: in bed;with call bell/phone within reach;with family/visitor present;with bed alarm set Nurse Communication: Mobility status PT Visit Diagnosis: Unsteadiness on feet (R26.81);Difficulty in walking, not elsewhere classified (R26.2)    Time: 7846-9629 PT Time Calculation (min) (ACUTE ONLY): 34 min   Charges:   PT Evaluation $PT Eval Low Complexity: 1 Low PT Treatments $Gait  Training: 8-22 mins        Mauro Kaufmann PT Acute Rehabilitation Services Pager [phone] Office [phone]   [facility] 05/29/2022, 3:42 PM

## 2022-05-29 NOTE — Transfer of Care (Signed)
Immediate Anesthesia Transfer of Care Note  Patient: Charles [name]  Procedure(s) Performed: LEFT TOTAL HIP ARTHROPLASTY ANTERIOR APPROACH (Left: Hip)  Patient Location: PACU  Anesthesia Type:MAC and Spinal  Level of Consciousness: drowsy and patient cooperative  Airway & Oxygen Therapy: Patient Spontanous Breathing and Patient connected to face mask oxygen  Post-op Assessment: Report given to RN and Post -op Vital signs reviewed and stable  Post vital signs: Reviewed and stable  Last Vitals:  Vitals Value Taken Time  BP 123/73 05/29/22 1003  Temp    Pulse 63 05/29/22 1005  Resp 10 05/29/22 1005  SpO2 99 % 05/29/22 1005  Vitals shown include unvalidated device data.  Last Pain:  Vitals:   05/29/22 0652  TempSrc: Oral  PainSc: 3          Complications: No notable events documented.

## 2022-05-29 NOTE — TOC Transition Note (Signed)
Transition of Care [facility]) - CM/SW Discharge Note   Patient Details  Name: Charles [name] MRN: [id number] Date of Birth: [birth year]  Transition of Care [facility] CM/SW Contact:  Amada Jupiter, LCSW Phone Number: 05/29/2022, 3:17 PM   Clinical Narrative:     Met with pt and wife who confirm need for RW and 3n1 and no agency preference - order placed with [employer] [facility] for delivery to room.  HHPT prearranged with [facility].  No further TOC needs.  Final next level of care: Home w Home Health Services Barriers to Discharge: No Barriers Identified   Patient Goals and CMS Choice      Discharge Placement                         Discharge Plan and Services Additional resources added to the After Visit Summary for                  DME Arranged: Walker rolling, 3-N-1 DME Agency: [employer] Date DME Agency Contacted: 05/29/22 Time DME Agency Contacted: [id number] Representative spoke with at DME Agency: Earna Coder HH Arranged: PT HH Agency: [facility]        Social Determinants of Health (SDOH) Interventions SDOH Screenings   Food Insecurity: No Food Insecurity (05/29/2022)  Housing: Low Risk  (05/29/2022)  Transportation Needs: No Transportation Needs (05/29/2022)  Utilities: Not At Risk (05/29/2022)  Depression (PHQ2-9): Low Risk  (04/09/2022)  Financial Resource Strain: Low Risk  (03/25/2022)  Physical Activity: Sufficiently Active (03/25/2022)  Social Connections: Socially Integrated (03/25/2022)  Stress: No Stress Concern Present (03/25/2022)  Tobacco Use: Medium Risk (05/29/2022)     Readmission Risk Interventions     No data to display

## 2022-05-29 NOTE — Anesthesia Procedure Notes (Signed)
Spinal  Patient location during procedure: OR End time: 05/29/2022 8:34 AM Reason for block: surgical anesthesia Staffing Performed: anesthesiologist  Anesthesiologist: Jairo Ben, MD Resident/CRNA: Wynonia Sours, CRNA Performed by: Jairo Ben, MD Authorized by: Jairo Ben, MD   Preanesthetic Checklist Completed: patient identified, IV checked, site marked, risks and benefits discussed, surgical consent, monitors and equipment checked, pre-op evaluation and timeout performed Spinal Block Patient position: sitting Prep: DuraPrep Patient monitoring: heart rate, cardiac monitor, continuous pulse ox and blood pressure Approach: midline Location: L3-4 Injection technique: single-shot Needle Needle type: Pencan and Introducer  Needle gauge: 24 G Needle length: 9 cm Assessment Sensory level: T4 Events: CSF return and second provider Additional Notes Pt identified in Operating room.  Monitors applied. Working IV access confirmed. Sterile prep, drape lumbar spine.  1% lido local L 3,4.  #24ga Pencan all attempts os (CRNA), then MD into clear CSF L 3,4.  13.5 mg 0.75% Bupivacaine with dextrose injected with asp CSF beginning and end of injection.  Patient asymptomatic, VSS, no heme aspirated, tolerated well.  Sandford Craze, MD

## 2022-05-29 NOTE — Op Note (Signed)
Operative Note  Date of operation: 05/29/2022 Preoperative diagnosis: Left hip primary osteoarthritis Postoperative diagnosis: Same  Procedure: Left direct anterior total hip arthroplasty  Implants: Implant Name Type Inv. Item Serial No. Manufacturer Lot No. LRB No. Used Action  ACETAB CUP W/GRIPTION 54 - [id number] Plate ACETAB CUP W/GRIPTION 54  [employer] [id number] Left 1 Implanted  LINER NEUTRAL 54X36MM PLUS 4 - [id number] Hips LINER NEUTRAL 54X36MM PLUS 4  DEPU[employer]TH[employer] [id number] Left 1 Implanted  STEM CORAIL KA12 - [id number] Stem STEM CORAIL KA12  DEPU[employer]TH[employer] [id number] Left 1 Implanted  HEAD CERAMIC DELTA 36 PLUS 1.5 - [id number] Hips HEAD CERAMIC DELTA 36 PLUS 1.5  [employer] [id number] Left 1 Implanted   Surgeon: Vanita Panda. Magnus Ivan, MD Assistant: Rexene Edison, PA-C  Anesthesia: Spinal Antibiotics: IV Ancef EBL: 300 cc Complications: None  Indications: The patient is an active 67 year old gentleman with debilitating arthritis involving his left hip that is been well-documented with x-rays and clinical exam findings.  We actually replaced his right hip many years ago.  At this point his left hip pain has become daily.  It is detrimentally affecting his mobility, his quality of life and his actives of daily living to the point he does wish to proceed with a total hip arthroplasty on the left side.  Having had this done before on the right side he is fully aware of the risk of acute blood loss anemia, nerve or vessel injury, fracture, infection, dislocation, DVT, implant failure, leg length differences and wound healing issues.  He understands her goals are hopefully decrease pain, improve mobility and improve quality of life.  Procedure description: After informed consent was obtained and the appropriate left hip was marked, the patient was brought to the operating room and set up on the stretcher where spinal anesthesia was obtained.  He was then laid in supine  position on the stretcher and a Foley catheter was placed.  Traction boots were placed on both his feet and he was placed supine on the Hana fracture table with a perineal post in place in both legs in inline skeletal traction vices but no traction applied.  The left operative hip and pelvis were assessed radiographically and then the left hip was prepped and draped with DuraPrep and sterile drapes.  A timeout was called and he was identified as the correct patient and the correct left hip.  An incision was then made just inferior and posterior to the ASIS and carried slightly obliquely down the leg.  Dissection was carried down to the tensor fascia lata muscle and the tensor fascia was then divided longitudinally to proceed with a direct interposed the hip.  Circumflex vessels were identified and cauterized.  The hip capsule identified and opened up in L-type format finding a moderate joint effusion.  Cobra retractors were placed around the medial and lateral femoral neck and a femoral neck cut was made with an oscillating saw just proximal to the lesser trochanter.  This Was completed with an osteotome.  A corkscrew guide was placed in the femoral head and the femoral head was removed in its entirety and there was a wide area devoid of cartilage.  A bent Hohmann was then placed over the medial acetabular rim and remnants of the acetabular labrum and other debris removed.  I then began reaming under direct visualization from a size 43 reamer and stepwise increments going up to a size 53 reamer with all reamers placed under direct visualization and the last reamer was  placed under direct fluoroscopy in order to obtain the depth of reaming, the inclination and the anteversion.  The real DePuy sector GRIPTION acetabular component size 54 was then placed without difficulty followed by a 36+4 polythene liner.  Attention was then turned to the femur.  With the left leg externally rotated to 120 degrees, extended and  abducted a Mueller retractor was placed medially and a Hohmann retractor was placed behind the greater trochanter.  The lateral joint capsule was released and a box cutting osteotome was used in the femoral canal.  Broaching was then initiated from a size 8 broach going up to a size 12 broach.  With a size 12 broach in place we trialed a standard offset femoral neck and a 36+1.5 trial hip ball.  This was reduced in the pelvis and we assessed it radiographically and clinically and we are pleased with leg length, offset, range of motion and stability.  The hip was then dislocated and we placed the real Corail femoral component with standard offset size 12 and the real 36+1.5 ceramic head ball.  Again this was reduced in the pelvis and we assessed it radiographically and clinically and were pleased.  The soft tissue was then irrigated with normal saline solution.  The joint capsule was closed with interrupted #1 Ethibond suture followed by #1 Vicryl to close the tensor fascia.  0 Vicryl was used to close the deep tissue and 2-0 Vicryl was used to close subcutaneous tissue.  Skin was closed with staples.  An Aquacel dressing was applied.  The patient was taken off the Hana table taken recovery in stable addition.  Rexene Edison, PA-C did assist during the entire case and beginning in and his assistance was crucial and medically necessary for soft tissue management and retraction, helping guide implant placement and a layered closure of the wound.

## 2022-05-29 NOTE — Anesthesia Procedure Notes (Signed)
Procedure Name: MAC Date/Time: 05/29/2022 8:25 AM  Performed by: Wynonia Sours, CRNAPre-anesthesia Checklist: Patient identified, Emergency Drugs available, Suction available, Patient being monitored and Timeout performed Patient Re-evaluated:Patient Re-evaluated prior to induction Oxygen Delivery Method: Simple face mask Preoxygenation: Pre-oxygenation with 100% oxygen Induction Type: IV induction Placement Confirmation: positive ETCO2

## 2022-05-29 NOTE — Interval H&P Note (Signed)
History and Physical Interval Note: The patient is here today for a left total hip replacement to treat his severe left hip arthritis.  There has been no acute or interval change in his medical status.  The risks and benefits of surgery have been discussed in detail and informed consent has been obtained.  The left operative hip has been marked.  05/29/2022 7:12 AM  Charles [name]  has presented today for surgery, with the diagnosis of LEFT HIP OSTEOARTHRITIS.  The various methods of treatment have been discussed with the patient and family. After consideration of risks, benefits and other options for treatment, the patient has consented to  Procedure(s): LEFT TOTAL HIP ARTHROPLASTY ANTERIOR APPROACH (Left) as a surgical intervention.  The patient's history has been reviewed, patient examined, no change in status, stable for surgery.  I have reviewed the patient's chart and labs.  Questions were answered to the patient's satisfaction.     Kathryne Hitch

## 2022-05-30 DIAGNOSIS — Z8546 Personal history of malignant neoplasm of prostate: Secondary | ICD-10-CM | POA: Diagnosis not present

## 2022-05-30 DIAGNOSIS — Z79899 Other long term (current) drug therapy: Secondary | ICD-10-CM | POA: Diagnosis not present

## 2022-05-30 DIAGNOSIS — M1612 Unilateral primary osteoarthritis, left hip: Secondary | ICD-10-CM | POA: Diagnosis not present

## 2022-05-30 DIAGNOSIS — Z96641 Presence of right artificial hip joint: Secondary | ICD-10-CM | POA: Diagnosis not present

## 2022-05-30 DIAGNOSIS — E119 Type 2 diabetes mellitus without complications: Secondary | ICD-10-CM | POA: Diagnosis not present

## 2022-05-30 DIAGNOSIS — Z7982 Long term (current) use of aspirin: Secondary | ICD-10-CM | POA: Diagnosis not present

## 2022-05-30 DIAGNOSIS — Z7952 Long term (current) use of systemic steroids: Secondary | ICD-10-CM | POA: Diagnosis not present

## 2022-05-30 DIAGNOSIS — I10 Essential (primary) hypertension: Secondary | ICD-10-CM | POA: Diagnosis not present

## 2022-05-30 DIAGNOSIS — Z7984 Long term (current) use of oral hypoglycemic drugs: Secondary | ICD-10-CM | POA: Diagnosis not present

## 2022-05-30 LAB — BASIC METABOLIC PANEL
Anion gap: 7 (ref 5–15)
BUN: 17 mg/dL (ref 8–23)
CO2: 28 mmol/L (ref 22–32)
Calcium: 8.5 mg/dL — ABNORMAL LOW (ref 8.9–10.3)
Chloride: 101 mmol/L (ref 98–111)
Creatinine, Ser: 0.88 mg/dL (ref 0.61–1.24)
GFR, Estimated: >60 mL/min (ref >60)
Glucose, Bld: 189 mg/dL — ABNORMAL HIGH (ref 70–99)
Potassium: 3.8 mmol/L (ref 3.5–5.1)
Sodium: 136 mmol/L (ref 135–145)

## 2022-05-30 LAB — CBC
HCT: 35.6 % — ABNORMAL LOW (ref 39.0–52.0)
Hemoglobin: 11.7 g/dL — ABNORMAL LOW (ref 13.0–17.0)
MCH: 31.1 pg (ref 26.0–34.0)
MCHC: 32.9 g/dL (ref 30.0–36.0)
MCV: 94.7 fL (ref 80.0–100.0)
Platelets: 177 10*3/uL (ref 150–400)
RBC: 3.76 MIL/uL — ABNORMAL LOW (ref 4.22–5.81)
RDW: 13.1 % (ref 11.5–15.5)
WBC: 7.7 10*3/uL (ref 4.0–10.5)
nRBC: 0 % (ref 0.0–0.2)

## 2022-05-30 MED ORDER — METHOCARBAMOL 500 MG PO TABS: 500.0000 mg | ORAL_TABLET | Freq: Four times a day (QID) | ORAL | 1 refills | Status: DC | PRN

## 2022-05-30 MED ORDER — OXYCODONE HCL 5 MG PO TABS: 5.0000 mg | ORAL_TABLET | ORAL | 0 refills | Status: DC | PRN

## 2022-05-30 MED ORDER — ASPIRIN 81 MG PO CHEW: 81.0000 mg | CHEWABLE_TABLET | Freq: Two times a day (BID) | ORAL | 0 refills | Status: DC

## 2022-05-30 NOTE — Progress Notes (Signed)
Physical Therapy Treatment Patient Details Name: Charles [name] MRN: [id number] DOB: 03-01-1955 Today's Date: 05/30/2022   History of Present Illness Pt s/p L THR and with hx of R THR and neuropathy bil feet    PT Comments    Pt very motivated and progressing well with mobility.  Pt up to ambulate in hall, to bathroom for toileting and hygiene at sink; negotiated stairs, reviewed car transfers and reviewed bed mobility.  Spouse present for full session.  Pt with marked improvement in all tasks.  Pt and spouse feel comfortable with dc home this date.   Recommendations for follow up therapy are one component of a multi-disciplinary discharge planning process, led by the attending physician.  Recommendations may be updated based on patient status, additional functional criteria and insurance authorization.  Follow Up Recommendations       Assistance Recommended at Discharge Intermittent Supervision/Assistance  Patient can return home with the following     Equipment Recommendations  Rolling walker (2 wheels);BSC/3in1    Recommendations for Other Services       Precautions / Restrictions Precautions Precautions: Fall Restrictions Weight Bearing Restrictions: No Other Position/Activity Restrictions: WBAT     Mobility  Bed Mobility Overal bed mobility: Needs Assistance Bed Mobility: Supine to Sit, Sit to Supine     Supine to sit: Min assist Sit to supine: Min assist   General bed mobility comments: Increased time with cues for sequence and use of R LE to self assist    Transfers Overall transfer level: Needs assistance Equipment used: Rolling walker (2 wheels) Transfers: Sit to/from Stand Sit to Stand: Min guard           General transfer comment: cues for LE management and use of UEs to self assist;  Pt requiring elevated bed to perform    Ambulation/Gait Ambulation/Gait assistance: Min guard, Supervision Gait Distance (Feet): 120 Feet Assistive device:  Rolling walker (2 wheels) Gait Pattern/deviations: Step-to pattern Gait velocity: decr     General Gait Details: cues for sequence, posture and position from RW   Stairs Stairs: Yes Stairs assistance: Min assist Stair Management: Two rails, Step to pattern, Forwards Number of Stairs: 5 General stair comments: cues for sequence   Wheelchair Mobility    Modified Rankin (Stroke Patients Only)       Balance Overall balance assessment: Needs assistance Sitting-balance support: No upper extremity supported, Feet supported Sitting balance-Leahy Scale: Good     Standing balance support: No upper extremity supported Standing balance-Leahy Scale: Fair                              Cognition Arousal/Alertness: Awake/alert Behavior During Therapy: WFL for tasks assessed/performed Overall Cognitive Status: Within Functional Limits for tasks assessed                                          Exercises Total Joint Exercises Ankle Circles/Pumps: AROM, Both, 15 reps, Supine Quad Sets: AROM, Both, 10 reps, Supine Heel Slides: AAROM, Left, 15 reps, Supine Hip ABduction/ADduction: AAROM, Left, 15 reps, Supine    General Comments        Pertinent Vitals/Pain Pain Assessment Pain Assessment: 0-10 Pain Score: 5  Pain Location: L hip Pain Descriptors / Indicators: Aching, Sore, Grimacing, Guarding Pain Intervention(s): Limited activity within patient's tolerance, Monitored during session, Premedicated before session,  Ice applied    Home Living                          Prior Function            PT Goals (current goals can now be found in the care plan section) Acute Rehab PT Goals Patient Stated Goal: REgain IND PT Goal Formulation: With patient Time For Goal Achievement: 06/05/22 Potential to Achieve Goals: Good Progress towards PT goals: Progressing toward goals    Frequency    7X/week      PT Plan Current plan remains  appropriate    Co-evaluation              AM-PAC PT "6 Clicks" Mobility   Outcome Measure  Help needed turning from your back to your side while in a flat bed without using bedrails?: A Little Help needed moving from lying on your back to sitting on the side of a flat bed without using bedrails?: A Little Help needed moving to and from a bed to a chair (including a wheelchair)?: A Little Help needed standing up from a chair using your arms (e.g., wheelchair or bedside chair)?: A Little Help needed to walk in hospital room?: A Little Help needed climbing 3-5 steps with a railing? : A Little 6 Click Score: 18    End of Session Equipment Utilized During Treatment: Gait belt Activity Tolerance: Patient tolerated treatment well Patient left: in chair;with call bell/phone within reach;with family/visitor present Nurse Communication: Mobility status PT Visit Diagnosis: Unsteadiness on feet (R26.81);Difficulty in walking, not elsewhere classified (R26.2)     Time: 1219-1300 PT Time Calculation (min) (ACUTE ONLY): 41 min  Charges:  $Gait Training: 8-22 mins $Therapeutic Activity: 23-37 mins                     Charles [name] PT Acute Rehabilitation Services Pager [phone] Office [phone]    Charles [name] 05/30/2022, 1:29 PM

## 2022-05-30 NOTE — Discharge Summary (Signed)
Patient ID: FORMAN PAREKH MRN: [id number] DOB/AGE: Mar 21, 1955 67 y.o.  Admit date: 05/29/2022 Discharge date: 05/30/2022  Admission Diagnoses:  Principal Problem:   Status post total replacement of left hip Active Problems:   Unilateral primary osteoarthritis, left hip   Discharge Diagnoses:  Same  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   Neuromuscular disorder (HCC)    neuropathy feet   Prostate cancer (HCC) 04/02/2021   Tubular adenoma of colon    Ulcer 2004    Surgeries: Procedure(s): LEFT TOTAL HIP ARTHROPLASTY ANTERIOR APPROACH on 05/29/2022   Consultants:   Discharged Condition: Improved  Hospital Course: LEO INZUNZA is an 67 y.o. male who was admitted 05/29/2022 for operative treatment ofStatus post total replacement of left hip. Patient has severe unremitting pain that affects sleep, daily activities, and work/hobbies. After pre-op clearance the patient was taken to the operating room on 05/29/2022 and underwent  Procedure(s): LEFT TOTAL HIP ARTHROPLASTY ANTERIOR APPROACH.    Patient was given perioperative antibiotics:  Anti-infectives (From admission, onward)    Start     Dose/Rate Route Frequency Ordered Stop   05/29/22 1430  ceFAZolin (ANCEF) IVPB 1 g/50 mL premix        1 g 100 mL/hr over 30 Minutes Intravenous Every 6 hours 05/29/22 1205 05/29/22 2143   05/29/22 0715  ceFAZolin (ANCEF) IVPB 2g/100 mL premix        2 g 200 mL/hr over 30 Minutes Intravenous On call to O.R. 05/29/22 6644 05/29/22 0906   05/29/22 0649  ceFAZolin (ANCEF) 2-4 GM/100ML-% IVPB       Note to Pharmacy: Richardean Sale M: cabinet override      05/29/22 0649 05/29/22  0840        Patient was given sequential compression devices, early ambulation, and chemoprophylaxis to prevent DVT.  Patient benefited maximally from hospital stay and there were no complications.    Recent vital signs: Patient Vitals for the past 24 hrs:  BP Temp Temp src Pulse Resp SpO2  05/30/22 0521 138/82 98.6 F (37 C) Oral 77 17 93 %  05/30/22 0115 125/70 97.7 F (36.5 C) Oral 69 17 94 %  05/29/22 2111 116/70 98.4 F (36.9 C) Oral 73 17 94 %     Recent laboratory studies:  Recent Labs    05/30/22 0312  WBC 7.7  HGB 11.7*  HCT 35.6*  PLT 177  NA 136  K 3.8  CL 101  CO2 28  BUN 17  CREATININE 0.88  GLUCOSE 189*  CALCIUM 8.5*     Discharge Medications:   Allergies as of 05/30/2022   No Known Allergies      Medication List     STOP taking these medications    aspirin EC 81 MG tablet Replaced by: aspirin 81 MG chewable tablet       TAKE these medications    acetaminophen 500 MG tablet Commonly known as: TYLENOL Take 1,000 mg by mouth every 6 (six) hours as needed for moderate pain.   aspirin 81 MG chewable tablet Chew 1 tablet (81 mg total) by mouth 2 (two) times daily. Replaces: aspirin EC 81 MG tablet   atorvastatin 80 MG tablet  Commonly known as: LIPITOR Take 1 tablet (80 mg total) by mouth daily.   B-12 5000 MCG Caps Take 5,000 mcg by mouth daily.   B-D SINGLE USE SWABS REGULAR Pads Use to check blood sugar up to 2 times a day   celecoxib 200 MG capsule Commonly known as: CeleBREX Take 1 capsule (200 mg total) by mouth daily.   cetirizine 10 MG tablet Commonly known as: ZYRTEC Take 10 mg by mouth daily.   Clinpro 5000 1.1 % Pste Generic drug: Sodium Fluoride Place 1 Application onto teeth daily.   clobetasol ointment 0.05 % Commonly known as: TEMOVATE Apply 1 Application topically 2 (two) times daily. What changed:  when to take this reasons to take this   FLAX SEED OIL PO Take 1 capsule by mouth daily.    FLUoxetine 20 MG capsule Commonly known as: PROZAC TAKE 1 CAPSULE BY MOUTH EVERY DAY   fluticasone 50 MCG/ACT nasal spray Commonly known as: FLONASE Place 2 sprays into both nostrils daily.   gabapentin 300 MG capsule Commonly known as: NEURONTIN Take 2 capsules (600 mg total) by mouth at bedtime.   LORazepam 0.5 MG tablet Commonly known as: ATIVAN Take 1 tablet (0.5 mg total) by mouth every 8 (eight) hours as needed. for anxiety   losartan 25 MG tablet Commonly known as: COZAAR TAKE 1 TABLET (25 MG TOTAL) BY MOUTH DAILY.   metFORMIN 500 MG 24 hr tablet Commonly known as: GLUCOPHAGE-XR Take 4 tablets (2,000 mg total) by mouth daily with breakfast.   methocarbamol 500 MG tablet Commonly known as: ROBAXIN Take 1 tablet (500 mg total) by mouth every 6 (six) hours as needed for muscle spasms.   multivitamin with minerals tablet Take 1 tablet by mouth daily.   mupirocin ointment 2 % Commonly known as: BACTROBAN Place 1 Application into the nose 2 (two) times daily.   OneTouch Delica Plus Lancet30G Misc USE TO CHECK BLOOD SUGAR UP TO 2 TIMES A DAY.   OneTouch Verio test strip Generic drug: glucose blood TEST BLOOD SUGAR UP TO 2 TIMES DAILY   oxyCODONE 5 MG immediate release tablet Commonly known as: Oxy IR/ROXICODONE Take 1-2 tablets (5-10 mg total) by mouth every 4 (four) hours as needed for moderate pain (pain score 4-6).   Pataday 0.7 % Soln Generic drug: Olopatadine HCl Place 1 drop into both eyes daily.   sildenafil 100 MG tablet Commonly known as: Viagra Take 0.5-1 tablets (50-100 mg total) by mouth daily as needed for erectile dysfunction.   sulfamethoxazole-trimethoprim 800-160 MG tablet Commonly known as: BACTRIM DS Take 1 tablet by mouth 2 (two) times daily.   SYSTANE OP Place 1 drop into both eyes as needed (dry eyes).   Vitamin D 50 MCG (2000 UT) tablet Take 2,000 Units by mouth daily.               Durable Medical Equipment  (From  admission, onward)           Start     Ordered   05/29/22 1206  DME 3 n 1  Once        05/29/22 1205   05/29/22 1206  DME Walker rolling  Once       Question Answer Comment  Walker: With 5 Inch Wheels   Patient needs a walker to treat with the following condition Status post total replacement of left hip      05/29/22 1205            Diagnostic  Studies: DG Pelvis Portable  Result Date: 05/29/2022 CLINICAL DATA:  [id number] Status post total replacement of left hip [id number] EXAM: PORTABLE PELVIS 1-2 VIEWS COMPARISON:  03/26/2022 FINDINGS: Postsurgical changes of left hip arthroplasty. Normal alignment. No evidence of loosening or periprosthetic fracture. Expected soft tissue changes. Unchanged right hip arthroplasty. IMPRESSION: Postsurgical changes of left hip arthroplasty. No evidence of immediate hardware complication. Normal alignment. Electronically Signed   By: Caprice Renshaw M.D.   On: 05/29/2022 11:46   DG HIP UNILAT WITH PELVIS 1V LEFT  Result Date: 05/29/2022 CLINICAL DATA:  [id number] Surgery, elective [id number] EXAM: DG HIP (WITH OR WITHOUT PELVIS) 1V*L* COMPARISON:  Radiograph 03/26/2022 FINDINGS: Intraoperative images during left hip arthroplasty. Normal alignment. No evidence of loosening or periprosthetic fracture. Expected soft tissue changes. Fluoroscopy time: 13 seconds. Dose: 2.8317 mGy. IMPRESSION: Intraoperative images during left hip arthroplasty. Normal alignment. No evidence of immediate hardware complication. Fluoroscopy time/dose as above. Electronically Signed   By: Caprice Renshaw M.D.   On: 05/29/2022 10:12   DG C-Arm 1-60 Min-No Report  Result Date: 05/29/2022 Fluoroscopy was utilized by the requesting physician.  No radiographic interpretation.    Disposition: Discharge disposition: 01-Home or Self Care          Follow-up Information     [facility] Follow up.   Specialty: Home Health Services Why: to provide home physical therapy  visits Contact information: [address] [place] [address] [phone]         Kathryne Hitch, MD Follow up in 2 week(s).   Specialty: Orthopedic Surgery Contact information: [address] [place] [address] [phone]                  Signed: Kathryne Hitch 05/30/2022, 7:20 PM

## 2022-05-30 NOTE — Progress Notes (Signed)
Subjective: 1 Day Post-Op Procedure(s) (LRB): LEFT TOTAL HIP ARTHROPLASTY ANTERIOR APPROACH (Left) Patient reports pain as moderate.  His wife is at the bedside.  She does report that he had a harder time getting out of bed this morning.  PT has worked with him this morning and plans to work with him again this afternoon  Objective: Vital signs in last 24 hours: Temp:  [97.5 F (36.4 C)-98.6 F (37 C)] 98.6 F (37 C) (05/25 0521) Pulse Rate:  [50-77] 77 (05/25 0521) Resp:  [9-18] 17 (05/25 0521) BP: (103-138)/(67-82) 138/82 (05/25 0521) SpO2:  [93 %-100 %] 93 % (05/25 0521)  Intake/Output from previous day: 05/24 0701 - 05/25 0700 In: 2906.5 [P.O.:360; I.V.:2296.5; IV Piggyback:250] Out: 1625 [Urine:1325; Blood:300] Intake/Output this shift: Total I/O In: -  Out: 150 [Urine:150]  Recent Labs    05/30/22 0312  HGB 11.7*   Recent Labs    05/30/22 0312  WBC 7.7  RBC 3.76*  HCT 35.6*  PLT 177   Recent Labs    05/30/22 0312  NA 136  K 3.8  CL 101  CO2 28  BUN 17  CREATININE 0.88  GLUCOSE 189*  CALCIUM 8.5*   No results for input(s): "LABPT", "INR" in the last 72 hours.  Sensation intact distally Intact pulses distally Dorsiflexion/Plantar flexion intact Incision: dressing C/D/I   Assessment/Plan: 1 Day Post-Op Procedure(s) (LRB): LEFT TOTAL HIP ARTHROPLASTY ANTERIOR APPROACH (Left) Up with therapy Discharge home with home health this afternoon versus tomorrow depending on his progress with mobility and physical therapy.  If it is deemed from the utilization review team standpoint that the patient needs to be transitioned to an inpatient admission if he states today, they have my permission to do so as a voice order.      Kathryne Hitch 05/30/2022, 11:04 AM

## 2022-05-30 NOTE — Plan of Care (Signed)
  Problem: Education: Goal: Knowledge of the prescribed therapeutic regimen will improve Outcome: Progressing   Problem: Activity: Goal: Ability to avoid complications of mobility impairment will improve Outcome: Progressing   Problem: Pain Management: Goal: Pain level will decrease with appropriate interventions Outcome: Progressing   

## 2022-05-30 NOTE — Progress Notes (Signed)
Physical Therapy Treatment Patient Details Name: Charles [name] MRN: [id number] DOB: 22-Apr-1955 Today's Date: 05/30/2022   History of Present Illness Pt s/p L THR and with hx of R THR and neuropathy bil feet    PT Comments    Pt continues very cooperative and and up to ambulate increased distance in hall but requiring increased time bed mobility and transfers.  HEP initiated.  Pt hopeful for dc home later this date.  Recommendations for follow up therapy are one component of a multi-disciplinary discharge planning process, led by the attending physician.  Recommendations may be updated based on patient status, additional functional criteria and insurance authorization.  Follow Up Recommendations       Assistance Recommended at Discharge Intermittent Supervision/Assistance  Patient can return home with the following     Equipment Recommendations  Rolling walker (2 wheels);BSC/3in1    Recommendations for Other Services       Precautions / Restrictions Precautions Precautions: Fall Restrictions Weight Bearing Restrictions: No Other Position/Activity Restrictions: WAT     Mobility  Bed Mobility Overal bed mobility: Needs Assistance Bed Mobility: Supine to Sit     Supine to sit: Min assist     General bed mobility comments: Increased time with cues for sequence and use of R LE to self assist    Transfers Overall transfer level: Needs assistance Equipment used: Rolling walker (2 wheels) Transfers: Sit to/from Stand Sit to Stand: Min assist           General transfer comment: cues for LE management and use of UEs to self assist;  Pt requiring elevated bed to perform    Ambulation/Gait Ambulation/Gait assistance: Min assist, Min guard Gait Distance (Feet): 120 Feet Assistive device: Rolling walker (2 wheels) Gait Pattern/deviations: Step-to pattern Gait velocity: decr     General Gait Details: cues for sequence, posture and position from [employer]              Wheelchair Mobility    Modified Rankin (Stroke Patients Only)       Balance Overall balance assessment: Needs assistance Sitting-balance support: No upper extremity supported, Feet supported Sitting balance-Leahy Scale: Fair     Standing balance support: Single extremity supported Standing balance-Leahy Scale: Poor                              Cognition Arousal/Alertness: Awake/alert Behavior During Therapy: WFL for tasks assessed/performed Overall Cognitive Status: Within Functional Limits for tasks assessed                                          Exercises Total Joint Exercises Ankle Circles/Pumps: AROM, Both, 15 reps, Supine Quad Sets: AROM, Both, 10 reps, Supine Heel Slides: AAROM, Left, 15 reps, Supine Hip ABduction/ADduction: AAROM, Left, 15 reps, Supine    General Comments        Pertinent Vitals/Pain Pain Assessment Pain Assessment: 0-10 Pain Score: 5  Pain Location: L hip Pain Descriptors / Indicators: Aching, Sore, Grimacing, Guarding Pain Intervention(s): Limited activity within patient's tolerance, Monitored during session, Premedicated before session, Ice applied    Home Living                          Prior Function  PT Goals (current goals can now be found in the care plan section) Acute Rehab PT Goals Patient Stated Goal: REgain IND PT Goal Formulation: With patient Time For Goal Achievement: 06/05/22 Potential to Achieve Goals: Good Progress towards PT goals: Progressing toward goals    Frequency    7X/week      PT Plan Current plan remains appropriate    Co-evaluation              AM-PAC PT "6 Clicks" Mobility   Outcome Measure  Help needed turning from your back to your side while in a flat bed without using bedrails?: A Little Help needed moving from lying on your back to sitting on the side of a flat bed without using bedrails?: A Little Help needed  moving to and from a bed to a chair (including a wheelchair)?: A Little Help needed standing up from a chair using your arms (e.g., wheelchair or bedside chair)?: A Little Help needed to walk in hospital room?: A Little Help needed climbing 3-5 steps with a railing? : A Little 6 Click Score: 18    End of Session Equipment Utilized During Treatment: Gait belt Activity Tolerance: Patient tolerated treatment well Patient left: in chair;with call bell/phone within reach;with family/visitor present Nurse Communication: Mobility status PT Visit Diagnosis: Unsteadiness on feet (R26.81);Difficulty in walking, not elsewhere classified (R26.2)     Time: 1610-9604 PT Time Calculation (min) (ACUTE ONLY): 42 min  Charges:  $Gait Training: 8-22 mins $Therapeutic Exercise: 8-22 mins $Therapeutic Activity: 8-22 mins                     Mauro Kaufmann PT Acute Rehabilitation Services Pager [phone] Office [phone]    [facility] 05/30/2022, 9:33 AM

## 2022-05-30 NOTE — Discharge Instructions (Signed)

## 2022-05-31 DIAGNOSIS — E1140 Type 2 diabetes mellitus with diabetic neuropathy, unspecified: Secondary | ICD-10-CM | POA: Diagnosis not present

## 2022-05-31 DIAGNOSIS — K5790 Diverticulosis of intestine, part unspecified, without perforation or abscess without bleeding: Secondary | ICD-10-CM | POA: Diagnosis not present

## 2022-05-31 DIAGNOSIS — F411 Generalized anxiety disorder: Secondary | ICD-10-CM | POA: Diagnosis not present

## 2022-05-31 DIAGNOSIS — Z471 Aftercare following joint replacement surgery: Secondary | ICD-10-CM | POA: Diagnosis not present

## 2022-05-31 DIAGNOSIS — C8580 Other specified types of non-Hodgkin lymphoma, unspecified site: Secondary | ICD-10-CM | POA: Diagnosis not present

## 2022-05-31 DIAGNOSIS — K219 Gastro-esophageal reflux disease without esophagitis: Secondary | ICD-10-CM | POA: Diagnosis not present

## 2022-05-31 DIAGNOSIS — E785 Hyperlipidemia, unspecified: Secondary | ICD-10-CM | POA: Diagnosis not present

## 2022-05-31 DIAGNOSIS — M1990 Unspecified osteoarthritis, unspecified site: Secondary | ICD-10-CM | POA: Diagnosis not present

## 2022-05-31 DIAGNOSIS — I10 Essential (primary) hypertension: Secondary | ICD-10-CM | POA: Diagnosis not present

## 2022-05-31 DIAGNOSIS — Z96642 Presence of left artificial hip joint: Secondary | ICD-10-CM | POA: Diagnosis not present

## 2022-06-02 ENCOUNTER — Telehealth: Payer: Self-pay | Admitting: *Deleted

## 2022-06-02 ENCOUNTER — Encounter (HOSPITAL_COMMUNITY): Payer: Self-pay | Admitting: Orthopaedic Surgery

## 2022-06-02 NOTE — Telephone Encounter (Signed)
Ortho bundle D/C call completed. 

## 2022-06-03 DIAGNOSIS — K5790 Diverticulosis of intestine, part unspecified, without perforation or abscess without bleeding: Secondary | ICD-10-CM | POA: Diagnosis not present

## 2022-06-03 DIAGNOSIS — E1140 Type 2 diabetes mellitus with diabetic neuropathy, unspecified: Secondary | ICD-10-CM | POA: Diagnosis not present

## 2022-06-03 DIAGNOSIS — K219 Gastro-esophageal reflux disease without esophagitis: Secondary | ICD-10-CM | POA: Diagnosis not present

## 2022-06-03 DIAGNOSIS — Z471 Aftercare following joint replacement surgery: Secondary | ICD-10-CM | POA: Diagnosis not present

## 2022-06-03 DIAGNOSIS — E785 Hyperlipidemia, unspecified: Secondary | ICD-10-CM | POA: Diagnosis not present

## 2022-06-03 DIAGNOSIS — C8580 Other specified types of non-Hodgkin lymphoma, unspecified site: Secondary | ICD-10-CM | POA: Diagnosis not present

## 2022-06-03 DIAGNOSIS — F411 Generalized anxiety disorder: Secondary | ICD-10-CM | POA: Diagnosis not present

## 2022-06-03 DIAGNOSIS — I10 Essential (primary) hypertension: Secondary | ICD-10-CM | POA: Diagnosis not present

## 2022-06-03 DIAGNOSIS — Z96642 Presence of left artificial hip joint: Secondary | ICD-10-CM | POA: Diagnosis not present

## 2022-06-03 DIAGNOSIS — M1990 Unspecified osteoarthritis, unspecified site: Secondary | ICD-10-CM | POA: Diagnosis not present

## 2022-06-04 ENCOUNTER — Encounter: Payer: Self-pay | Admitting: Family Medicine

## 2022-06-05 DIAGNOSIS — C8580 Other specified types of non-Hodgkin lymphoma, unspecified site: Secondary | ICD-10-CM | POA: Diagnosis not present

## 2022-06-05 DIAGNOSIS — E1140 Type 2 diabetes mellitus with diabetic neuropathy, unspecified: Secondary | ICD-10-CM | POA: Diagnosis not present

## 2022-06-05 DIAGNOSIS — Z96642 Presence of left artificial hip joint: Secondary | ICD-10-CM | POA: Diagnosis not present

## 2022-06-05 DIAGNOSIS — K219 Gastro-esophageal reflux disease without esophagitis: Secondary | ICD-10-CM | POA: Diagnosis not present

## 2022-06-05 DIAGNOSIS — Z471 Aftercare following joint replacement surgery: Secondary | ICD-10-CM | POA: Diagnosis not present

## 2022-06-05 DIAGNOSIS — I10 Essential (primary) hypertension: Secondary | ICD-10-CM | POA: Diagnosis not present

## 2022-06-05 DIAGNOSIS — M1990 Unspecified osteoarthritis, unspecified site: Secondary | ICD-10-CM | POA: Diagnosis not present

## 2022-06-05 DIAGNOSIS — K5790 Diverticulosis of intestine, part unspecified, without perforation or abscess without bleeding: Secondary | ICD-10-CM | POA: Diagnosis not present

## 2022-06-05 DIAGNOSIS — E785 Hyperlipidemia, unspecified: Secondary | ICD-10-CM | POA: Diagnosis not present

## 2022-06-05 DIAGNOSIS — F411 Generalized anxiety disorder: Secondary | ICD-10-CM | POA: Diagnosis not present

## 2022-06-08 ENCOUNTER — Encounter: Payer: Self-pay | Admitting: Internal Medicine

## 2022-06-08 ENCOUNTER — Telehealth: Payer: Self-pay | Admitting: *Deleted

## 2022-06-08 DIAGNOSIS — Z471 Aftercare following joint replacement surgery: Secondary | ICD-10-CM | POA: Diagnosis not present

## 2022-06-08 DIAGNOSIS — Z96642 Presence of left artificial hip joint: Secondary | ICD-10-CM | POA: Diagnosis not present

## 2022-06-08 DIAGNOSIS — K5790 Diverticulosis of intestine, part unspecified, without perforation or abscess without bleeding: Secondary | ICD-10-CM | POA: Diagnosis not present

## 2022-06-08 DIAGNOSIS — C8580 Other specified types of non-Hodgkin lymphoma, unspecified site: Secondary | ICD-10-CM | POA: Diagnosis not present

## 2022-06-08 DIAGNOSIS — F411 Generalized anxiety disorder: Secondary | ICD-10-CM | POA: Diagnosis not present

## 2022-06-08 DIAGNOSIS — E1140 Type 2 diabetes mellitus with diabetic neuropathy, unspecified: Secondary | ICD-10-CM | POA: Diagnosis not present

## 2022-06-08 DIAGNOSIS — E785 Hyperlipidemia, unspecified: Secondary | ICD-10-CM | POA: Diagnosis not present

## 2022-06-08 DIAGNOSIS — K219 Gastro-esophageal reflux disease without esophagitis: Secondary | ICD-10-CM | POA: Diagnosis not present

## 2022-06-08 DIAGNOSIS — M1990 Unspecified osteoarthritis, unspecified site: Secondary | ICD-10-CM | POA: Diagnosis not present

## 2022-06-08 DIAGNOSIS — I10 Essential (primary) hypertension: Secondary | ICD-10-CM | POA: Diagnosis not present

## 2022-06-08 NOTE — Telephone Encounter (Signed)
Please schedule recall colon in October for pt.

## 2022-06-08 NOTE — Telephone Encounter (Signed)
Colonoscopy scheduled for 10/13/2022 at 9am

## 2022-06-08 NOTE — Telephone Encounter (Signed)
Ortho bundle 7 day call completed. 

## 2022-06-10 DIAGNOSIS — Z471 Aftercare following joint replacement surgery: Secondary | ICD-10-CM | POA: Diagnosis not present

## 2022-06-10 DIAGNOSIS — K5790 Diverticulosis of intestine, part unspecified, without perforation or abscess without bleeding: Secondary | ICD-10-CM | POA: Diagnosis not present

## 2022-06-10 DIAGNOSIS — E1140 Type 2 diabetes mellitus with diabetic neuropathy, unspecified: Secondary | ICD-10-CM | POA: Diagnosis not present

## 2022-06-10 DIAGNOSIS — E785 Hyperlipidemia, unspecified: Secondary | ICD-10-CM | POA: Diagnosis not present

## 2022-06-10 DIAGNOSIS — I10 Essential (primary) hypertension: Secondary | ICD-10-CM | POA: Diagnosis not present

## 2022-06-10 DIAGNOSIS — K219 Gastro-esophageal reflux disease without esophagitis: Secondary | ICD-10-CM | POA: Diagnosis not present

## 2022-06-10 DIAGNOSIS — M1990 Unspecified osteoarthritis, unspecified site: Secondary | ICD-10-CM | POA: Diagnosis not present

## 2022-06-10 DIAGNOSIS — F411 Generalized anxiety disorder: Secondary | ICD-10-CM | POA: Diagnosis not present

## 2022-06-10 DIAGNOSIS — Z96642 Presence of left artificial hip joint: Secondary | ICD-10-CM | POA: Diagnosis not present

## 2022-06-10 DIAGNOSIS — C8580 Other specified types of non-Hodgkin lymphoma, unspecified site: Secondary | ICD-10-CM | POA: Diagnosis not present

## 2022-06-11 ENCOUNTER — Ambulatory Visit (INDEPENDENT_AMBULATORY_CARE_PROVIDER_SITE_OTHER): Payer: Medicare HMO | Admitting: Orthopaedic Surgery

## 2022-06-11 ENCOUNTER — Encounter: Payer: Self-pay | Admitting: Orthopaedic Surgery

## 2022-06-11 ENCOUNTER — Telehealth: Payer: Self-pay | Admitting: *Deleted

## 2022-06-11 DIAGNOSIS — Z96642 Presence of left artificial hip joint: Secondary | ICD-10-CM

## 2022-06-11 NOTE — Telephone Encounter (Signed)
Ortho bundle 14 day in office meeting completed. °

## 2022-06-11 NOTE — Progress Notes (Signed)
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  His right hip was replaced about 9 years ago.  He is doing well overall.  He is ambulating with just a cane and therapy is set to release him tomorrow.  He has been compliant with a baby aspirin twice a day and has been wearing compressive socks.  On exam his calfs are soft.  There is no foot and ankle swelling so he can stop his aspirin.  He is taking minimal narcotics at this point so he can drive as well.  His left hip incision looks good.  The staples are removed and Steri-Strips applied.  From my standpoint he is doing great.  Will see him back in 4 weeks to see how he is doing overall but no x-rays are needed.  All question concerns were addressed and answered.

## 2022-06-12 DIAGNOSIS — M1990 Unspecified osteoarthritis, unspecified site: Secondary | ICD-10-CM | POA: Diagnosis not present

## 2022-06-12 DIAGNOSIS — Z96642 Presence of left artificial hip joint: Secondary | ICD-10-CM | POA: Diagnosis not present

## 2022-06-12 DIAGNOSIS — K5790 Diverticulosis of intestine, part unspecified, without perforation or abscess without bleeding: Secondary | ICD-10-CM | POA: Diagnosis not present

## 2022-06-12 DIAGNOSIS — I10 Essential (primary) hypertension: Secondary | ICD-10-CM | POA: Diagnosis not present

## 2022-06-12 DIAGNOSIS — C8580 Other specified types of non-Hodgkin lymphoma, unspecified site: Secondary | ICD-10-CM | POA: Diagnosis not present

## 2022-06-12 DIAGNOSIS — Z471 Aftercare following joint replacement surgery: Secondary | ICD-10-CM | POA: Diagnosis not present

## 2022-06-12 DIAGNOSIS — E785 Hyperlipidemia, unspecified: Secondary | ICD-10-CM | POA: Diagnosis not present

## 2022-06-12 DIAGNOSIS — E1140 Type 2 diabetes mellitus with diabetic neuropathy, unspecified: Secondary | ICD-10-CM | POA: Diagnosis not present

## 2022-06-12 DIAGNOSIS — K219 Gastro-esophageal reflux disease without esophagitis: Secondary | ICD-10-CM | POA: Diagnosis not present

## 2022-06-12 DIAGNOSIS — F411 Generalized anxiety disorder: Secondary | ICD-10-CM | POA: Diagnosis not present

## 2022-06-13 ENCOUNTER — Other Ambulatory Visit: Payer: Self-pay | Admitting: Family Medicine

## 2022-06-15 NOTE — Telephone Encounter (Signed)
Refill request Celebrex Last refill 03/21/22 #30/2 Last office visit 04/09/22

## 2022-06-22 ENCOUNTER — Ambulatory Visit (HOSPITAL_COMMUNITY)
Admission: RE | Admit: 2022-06-22 | Discharge: 2022-06-22 | Disposition: A | Discharge status: Home / Self Care | Payer: Medicare HMO | Source: Ambulatory Visit | Attending: Cardiovascular Disease | Admitting: Cardiovascular Disease

## 2022-06-22 DIAGNOSIS — R0989 Other specified symptoms and signs involving the circulatory and respiratory systems: Secondary | ICD-10-CM | POA: Diagnosis not present

## 2022-06-23 ENCOUNTER — Encounter: Payer: Self-pay | Admitting: Cardiology

## 2022-06-23 ENCOUNTER — Encounter: Payer: Self-pay | Admitting: Hematology and Oncology

## 2022-06-24 DIAGNOSIS — J343 Hypertrophy of nasal turbinates: Secondary | ICD-10-CM | POA: Diagnosis not present

## 2022-06-24 DIAGNOSIS — R0683 Snoring: Secondary | ICD-10-CM | POA: Diagnosis not present

## 2022-06-24 DIAGNOSIS — J320 Chronic maxillary sinusitis: Secondary | ICD-10-CM | POA: Diagnosis not present

## 2022-06-24 DIAGNOSIS — R438 Other disturbances of smell and taste: Secondary | ICD-10-CM | POA: Diagnosis not present

## 2022-06-25 ENCOUNTER — Encounter: Payer: Medicare HMO | Admitting: Orthopaedic Surgery

## 2022-06-30 ENCOUNTER — Inpatient Hospital Stay: Payer: Medicare HMO | Admitting: Hematology and Oncology

## 2022-06-30 ENCOUNTER — Other Ambulatory Visit: Payer: Medicare HMO

## 2022-07-11 ENCOUNTER — Other Ambulatory Visit: Payer: Self-pay | Admitting: Family Medicine

## 2022-07-20 ENCOUNTER — Ambulatory Visit: Payer: Medicare HMO | Admitting: Orthopaedic Surgery

## 2022-07-20 ENCOUNTER — Encounter: Payer: Self-pay | Admitting: Orthopaedic Surgery

## 2022-07-20 ENCOUNTER — Telehealth: Payer: Self-pay | Admitting: *Deleted

## 2022-07-20 VITALS — Ht 70.0 in | Wt 220.0 lb

## 2022-07-20 DIAGNOSIS — Z96642 Presence of left artificial hip joint: Secondary | ICD-10-CM

## 2022-07-20 NOTE — Progress Notes (Signed)
The patient is now nearly 6 weeks status post a left hip replacement.  We replaced his right hip back many years ago.  He is doing well overall.  He has some pain when he puts on socks and gets in and out of a car but he is walking a mile and 1/2 to 2 miles a day.  He is about 85% better he states.  On exam he is moving that left hip smoothly and fluidly with no issues at all.  His right hip also move smoothly.  From my standpoint he will continue to increase his activities as comfort allows.  Will see him back in 6 months with a standing low AP pelvis and lateral of his more recent operative left hip.

## 2022-07-20 NOTE — Telephone Encounter (Signed)
Ortho bundle 30 day in office visit completed.

## 2022-07-21 ENCOUNTER — Encounter: Payer: Self-pay | Admitting: Neurology

## 2022-07-21 ENCOUNTER — Ambulatory Visit: Payer: Medicare HMO | Admitting: Neurology

## 2022-07-21 VITALS — BP 107/70 | HR 77 | Ht 70.0 in | Wt 213.0 lb

## 2022-07-21 DIAGNOSIS — G621 Alcoholic polyneuropathy: Secondary | ICD-10-CM

## 2022-07-21 DIAGNOSIS — E1142 Type 2 diabetes mellitus with diabetic polyneuropathy: Secondary | ICD-10-CM | POA: Diagnosis not present

## 2022-07-21 MED ORDER — GABAPENTIN 300 MG PO CAPS: 900.0000 mg | ORAL_CAPSULE | Freq: Every day | ORAL | 1 refills | Status: DC

## 2022-07-21 NOTE — Progress Notes (Signed)
Follow-up Visit   Date: 07/21/2022    Charles [name] MRN: [id number] DOB: 10-19-1955    Charles [name] is a 67 y.o. right-handed Caucasian male with diet controlled diabetes mellitus, anxiety, GERD, prostate cancer, marginal zone B cell lymphoma, and history of alcohol use  returning to the clinic for follow-up of neuropathy.  The patient was accompanied to the clinic by wife (retired Engineer, civil (consulting)) who also provides collateral information.    IMPRESSION/PLAN: Peripheral neuropathy manifesting with painful paresthesias of the feet.  Pain is still not well-controlled.  Risk factors:  diabetes, history of alcohol abuse.  Labs for secondary causes of neuropathy were reviewed and normal.   - Increase gabapentin to 900mg  at bedtime  - MyChart update in 4-6 weeks  Return to clinic in 4 months  --------------------------------------------- History of present illness: Starting about 12 years ago, he began having burning, stinging, and tingling in the feet and distal weakness. Symptoms are constant and worse in the evening when he tries to rest.  Nothing has worsened symptoms.  He has tried a foot massage which provides some relief.  He has noticed that his toes are harder to move, because they feel stuck.  Balance is good, although he had one fall today in the shower while trying to practice his golf swing.  He did not hurt himself.     He had diabetes for the past 3 years, which is diet controlled.  For about 25 years, until the mid 2000s, he was drinking excessively, sometimes 6 pack of beer nightly.  He has lost his drivers license on several occasions because of alcohol abuse. He is currently sober. No exposure to chemotherapy  UPDATE 03/16/2022:  He is here for follow-up visit.  He was started on gabapentin 300mg  at the last visit, however he has not noticed any improvement in the burning pain in the feet.  Pain is always worse in the evening.  He denies side effects. No new weakness or  imbalance.   He had PET scan today to assess response to chemotherapy for B cell lymphoma. He is understandably nervous about this.    UPDATE 07/21/2022:  He is here for follow-up visit.  He reports that his feel continue to have burning pain, which is worse when at rest or in the evening.  Unfortunately, he did not appreciate any change when he increased the dose of gabapentin to 600mg  at bedtime.  He underwent left hip replacement, which has helped with walking. He will be having upcoming sinus surgery.  No new complaints.    Medications:  Current Outpatient Medications on File Prior to Visit  Medication Sig Dispense Refill   acetaminophen (TYLENOL) 500 MG tablet Take 1,000 mg by mouth every 6 (six) hours as needed for moderate pain.     Alcohol Swabs (B-D SINGLE USE SWABS REGULAR) PADS Use to check blood sugar up to 2 times a day 100 each 5   aspirin EC 81 MG tablet Take 81 mg by mouth daily. Swallow whole.     atorvastatin (LIPITOR) 80 MG tablet Take 1 tablet (80 mg total) by mouth daily. 90 tablet 3   cetirizine (ZYRTEC) 10 MG tablet Take 10 mg by mouth daily.     Cholecalciferol (VITAMIN D) 50 MCG (2000 UT) tablet Take 2,000 Units by mouth daily.     CLINPRO 5000 1.1 % PSTE Place 1 Application onto teeth daily.     clobetasol ointment (TEMOVATE) 0.05 % Apply 1 Application topically 2 (  two) times daily. (Patient taking differently: Apply 1 Application topically 2 (two) times daily as needed (itching).) 30 g 1   Cyanocobalamin (B-12) 5000 MCG CAPS Take 5,000 mcg by mouth daily.     Flaxseed, Linseed, (FLAX SEED OIL PO) Take 1 capsule by mouth daily.     FLUoxetine (PROZAC) 20 MG capsule TAKE 1 CAPSULE BY MOUTH EVERY DAY 30 capsule 5   fluticasone (FLONASE) 50 MCG/ACT nasal spray Place 2 sprays into both nostrils daily.     gabapentin (NEURONTIN) 300 MG capsule Take 2 capsules (600 mg total) by mouth at bedtime. 180 capsule 1   glucose blood (ONETOUCH VERIO) test strip TEST BLOOD SUGAR UP TO 2  TIMES DAILY 100 strip 5   Lancets (ONETOUCH DELICA PLUS LANCET30G) MISC USE TO CHECK BLOOD SUGAR UP TO 2 TIMES A DAY. 100 each 5   LORazepam (ATIVAN) 0.5 MG tablet Take 1 tablet (0.5 mg total) by mouth every 8 (eight) hours as needed. for anxiety 30 tablet 1   losartan (COZAAR) 25 MG tablet TAKE 1 TABLET (25 MG TOTAL) BY MOUTH DAILY. 90 tablet 3   metFORMIN (GLUCOPHAGE-XR) 500 MG 24 hr tablet Take 4 tablets (2,000 mg total) by mouth daily with breakfast. 120 tablet 5   methocarbamol (ROBAXIN) 500 MG tablet Take 1 tablet (500 mg total) by mouth every 6 (six) hours as needed for muscle spasms. 30 tablet 1   Multiple Vitamins-Minerals (MULTIVITAMIN WITH MINERALS) tablet Take 1 tablet by mouth daily.     mupirocin ointment (BACTROBAN) 2 % Place 1 Application into the nose 2 (two) times daily.     Olopatadine HCl (PATADAY) 0.7 % SOLN Place 1 drop into both eyes daily.     Polyethyl Glycol-Propyl Glycol (SYSTANE OP) Place 1 drop into both eyes as needed (dry eyes).     sildenafil (VIAGRA) 100 MG tablet Take 0.5-1 tablets (50-100 mg total) by mouth daily as needed for erectile dysfunction. 5 tablet 11   No current facility-administered medications on file prior to visit.    Allergies: No Known Allergies  Vital Signs:  BP 107/70   Pulse 77   Ht 5\' 10"  (1.778 m)   Wt 213 lb (96.6 kg)   SpO2 97%   BMI 30.56 kg/m    Neurological Exam: MENTAL STATUS including orientation to time, place, person, recent and remote memory, attention span and concentration, language, and fund of knowledge is normal.  Speech is not dysarthric.  CRANIAL NERVES: Pupils equal round and reactive to light.  Normal conjugate, extra-ocular eye movements in all directions of gaze.  No ptosis .  Face is symmetric.   MOTOR:  Motor strength is 5/5 in all extremities, .  No atrophy, fasciculations or abnormal movements.  No pronator drift.  Tone is normal.    MSRs:  Reflexes are 2+/4 throughout.  SENSORY:  Intact to vibration  at the ankles.  COORDINATION/GAIT:  Normal finger-to- nose-finger.  Gait narrow based and stable.   Data:  Lab Results  Component Value Date   VITAMINB12 354 11/14/2021   Lab Results  Component Value Date   HGBA1C 7.1 (H) 04/01/2022     Thank you for allowing me to participate in patient's care.  If I can answer any additional questions, I would be pleased to do so.    Sincerely,    Cierah Crader K. Allena Katz, DO

## 2022-07-21 NOTE — Patient Instructions (Signed)
Increase gabapentin to 900mg  at bedtime (3 tablets)  Please send a MyChart update in 4-6 weeks  I will see you back in 4 months

## 2022-08-05 DIAGNOSIS — J320 Chronic maxillary sinusitis: Secondary | ICD-10-CM | POA: Diagnosis not present

## 2022-08-05 DIAGNOSIS — R0683 Snoring: Secondary | ICD-10-CM | POA: Diagnosis not present

## 2022-08-05 DIAGNOSIS — R438 Other disturbances of smell and taste: Secondary | ICD-10-CM | POA: Diagnosis not present

## 2022-08-05 DIAGNOSIS — J343 Hypertrophy of nasal turbinates: Secondary | ICD-10-CM | POA: Diagnosis not present

## 2022-08-06 ENCOUNTER — Encounter: Payer: Self-pay | Admitting: Family Medicine

## 2022-08-09 ENCOUNTER — Other Ambulatory Visit: Payer: Self-pay | Admitting: Family Medicine

## 2022-08-09 DIAGNOSIS — Z79899 Other long term (current) drug therapy: Secondary | ICD-10-CM

## 2022-08-09 DIAGNOSIS — E782 Mixed hyperlipidemia: Secondary | ICD-10-CM

## 2022-08-09 DIAGNOSIS — C61 Malignant neoplasm of prostate: Secondary | ICD-10-CM

## 2022-08-09 DIAGNOSIS — E538 Deficiency of other specified B group vitamins: Secondary | ICD-10-CM

## 2022-08-09 DIAGNOSIS — E119 Type 2 diabetes mellitus without complications: Secondary | ICD-10-CM

## 2022-08-09 DIAGNOSIS — E559 Vitamin D deficiency, unspecified: Secondary | ICD-10-CM

## 2022-08-12 NOTE — Telephone Encounter (Signed)
Can you help schedule him for the in-office diabetic retinopathy screen?

## 2022-08-14 ENCOUNTER — Telehealth: Payer: Self-pay | Admitting: Urology

## 2022-08-14 NOTE — Telephone Encounter (Signed)
LMTRC-  Need to confirm pcp -Charles [name]. If so we can access psa lab. Ok to get at PCP and cx lab appt.

## 2022-08-14 NOTE — Telephone Encounter (Signed)
Patient's wife called and stated that patient has an appointment with his primary doctor on 09/17/22, and will be having PSA lab done then. She asked if we can use that, or does need to keep lab appt with our office on 10/06/22? Please advise patient.

## 2022-08-17 NOTE — Telephone Encounter (Signed)
Ok to have PSA w/ PCP/ Lab appt canceled. Mychart message sent to patient.

## 2022-08-27 DIAGNOSIS — E1142 Type 2 diabetes mellitus with diabetic polyneuropathy: Secondary | ICD-10-CM | POA: Diagnosis not present

## 2022-08-27 DIAGNOSIS — M1990 Unspecified osteoarthritis, unspecified site: Secondary | ICD-10-CM | POA: Diagnosis not present

## 2022-08-27 DIAGNOSIS — Z8249 Family history of ischemic heart disease and other diseases of the circulatory system: Secondary | ICD-10-CM | POA: Diagnosis not present

## 2022-08-27 DIAGNOSIS — I10 Essential (primary) hypertension: Secondary | ICD-10-CM | POA: Diagnosis not present

## 2022-08-27 DIAGNOSIS — Z7984 Long term (current) use of oral hypoglycemic drugs: Secondary | ICD-10-CM | POA: Diagnosis not present

## 2022-08-27 DIAGNOSIS — E785 Hyperlipidemia, unspecified: Secondary | ICD-10-CM | POA: Diagnosis not present

## 2022-08-27 DIAGNOSIS — K219 Gastro-esophageal reflux disease without esophagitis: Secondary | ICD-10-CM | POA: Diagnosis not present

## 2022-08-27 DIAGNOSIS — Z823 Family history of stroke: Secondary | ICD-10-CM | POA: Diagnosis not present

## 2022-08-27 DIAGNOSIS — I2510 Atherosclerotic heart disease of native coronary artery without angina pectoris: Secondary | ICD-10-CM | POA: Diagnosis not present

## 2022-08-27 DIAGNOSIS — F419 Anxiety disorder, unspecified: Secondary | ICD-10-CM | POA: Diagnosis not present

## 2022-08-27 DIAGNOSIS — Z87891 Personal history of nicotine dependence: Secondary | ICD-10-CM | POA: Diagnosis not present

## 2022-08-27 DIAGNOSIS — Z809 Family history of malignant neoplasm, unspecified: Secondary | ICD-10-CM | POA: Diagnosis not present

## 2022-09-11 DIAGNOSIS — J342 Deviated nasal septum: Secondary | ICD-10-CM | POA: Diagnosis not present

## 2022-09-11 DIAGNOSIS — J343 Hypertrophy of nasal turbinates: Secondary | ICD-10-CM | POA: Diagnosis not present

## 2022-09-11 DIAGNOSIS — R0683 Snoring: Secondary | ICD-10-CM | POA: Diagnosis not present

## 2022-09-11 DIAGNOSIS — J320 Chronic maxillary sinusitis: Secondary | ICD-10-CM | POA: Diagnosis not present

## 2022-09-14 DIAGNOSIS — J320 Chronic maxillary sinusitis: Secondary | ICD-10-CM | POA: Diagnosis not present

## 2022-09-14 DIAGNOSIS — J343 Hypertrophy of nasal turbinates: Secondary | ICD-10-CM | POA: Diagnosis not present

## 2022-09-14 DIAGNOSIS — J321 Chronic frontal sinusitis: Secondary | ICD-10-CM | POA: Diagnosis not present

## 2022-09-14 DIAGNOSIS — J342 Deviated nasal septum: Secondary | ICD-10-CM | POA: Diagnosis not present

## 2022-09-14 DIAGNOSIS — R438 Other disturbances of smell and taste: Secondary | ICD-10-CM | POA: Diagnosis not present

## 2022-09-14 DIAGNOSIS — J329 Chronic sinusitis, unspecified: Secondary | ICD-10-CM | POA: Diagnosis not present

## 2022-09-14 DIAGNOSIS — R0683 Snoring: Secondary | ICD-10-CM | POA: Diagnosis not present

## 2022-09-14 DIAGNOSIS — J322 Chronic ethmoidal sinusitis: Secondary | ICD-10-CM | POA: Diagnosis not present

## 2022-09-14 DIAGNOSIS — Z9889 Other specified postprocedural states: Secondary | ICD-10-CM | POA: Insufficient documentation

## 2022-09-14 DIAGNOSIS — J323 Chronic sphenoidal sinusitis: Secondary | ICD-10-CM | POA: Diagnosis not present

## 2022-09-18 ENCOUNTER — Telehealth: Payer: Self-pay

## 2022-09-18 NOTE — Telephone Encounter (Signed)
Called and given below message to wife. She is agreeable appts moved to 10/17, she is aware of appt times.

## 2022-09-18 NOTE — Telephone Encounter (Signed)
-----   Message from Artis Delay sent at 09/18/2022  1:17 PM EDT ----- Just reviewed his chart Looks like he will get colonoscopy in October I recommend rescheduling his appt to at least 7-10 days out after colonoscopy so I can review test result with him If he agrees, please cancel and reschedule his appt next week

## 2022-09-22 ENCOUNTER — Ambulatory Visit: Payer: Medicare HMO | Admitting: Hematology and Oncology

## 2022-09-22 ENCOUNTER — Other Ambulatory Visit: Payer: Medicare HMO

## 2022-09-23 DIAGNOSIS — J343 Hypertrophy of nasal turbinates: Secondary | ICD-10-CM | POA: Diagnosis not present

## 2022-09-23 DIAGNOSIS — J342 Deviated nasal septum: Secondary | ICD-10-CM | POA: Diagnosis not present

## 2022-09-23 DIAGNOSIS — J320 Chronic maxillary sinusitis: Secondary | ICD-10-CM | POA: Diagnosis not present

## 2022-09-24 ENCOUNTER — Ambulatory Visit (AMBULATORY_SURGERY_CENTER): Payer: Medicare HMO | Admitting: *Deleted

## 2022-09-24 ENCOUNTER — Telehealth: Payer: Self-pay | Admitting: Family Medicine

## 2022-09-24 VITALS — Ht 70.0 in | Wt 219.0 lb

## 2022-09-24 DIAGNOSIS — C884 Extranodal marginal zone B-cell lymphoma of mucosa-associated lymphoid tissue [MALT-lymphoma]: Secondary | ICD-10-CM

## 2022-09-24 MED ORDER — NA SULFATE-K SULFATE-MG SULF 17.5-3.13-1.6 GM/177ML PO SOLN
1.0000 | Freq: Once | ORAL | 0 refills | Status: AC
Start: 2022-09-24 — End: 2022-09-24

## 2022-09-24 NOTE — Telephone Encounter (Signed)
Patient would like to get set up for the diabetic eye exam clinic.

## 2022-09-24 NOTE — Progress Notes (Signed)
Pt's name and DOB verified at the beginning of the pre-visit.  Pt denies any difficulty with ambulating,sitting, laying down or rolling side to side Gave both [facility] main # and MD on call # prior to instructions.  No egg or soy allergy known to patient  No issues known to pt with past sedation with any surgeries or procedures Pt denies having issues being intubated Pt has no issues moving head neck or swallowing No FH of Malignant Hyperthermia Pt is not on diet pills Pt is not on home 02  Pt is not on blood thinners  Pt denies issues with constipation  Pt is not on dialysis Pt denise any abnormal heart rhythms  Pt denies any upcoming cardiac testing Pt encouraged to use to use Singlecare or Goodrx to reduce cost  Patient's chart reviewed by Charles [name] CNRA prior to pre-visit and patient appropriate for the [facility].  Pre-visit completed and red dot placed by patient's name on their procedure day (on provider's schedule).  . Visit by phone Pt states weight is 219 lb Instructed pt why it is important to and  to call if they have any changes in health or new medications. Directed them to the # given and on instructions.   Pt states they will.  Instructions reviewed with pt and pt states understanding. Instructed to review again prior to procedure. Pt states they will.  Instructions sent by mail with coupon and by my chart

## 2022-09-29 NOTE — Telephone Encounter (Signed)
Noted, will have [facility] contact.

## 2022-10-02 ENCOUNTER — Other Ambulatory Visit: Payer: Self-pay

## 2022-10-02 DIAGNOSIS — C61 Malignant neoplasm of prostate: Secondary | ICD-10-CM

## 2022-10-05 ENCOUNTER — Encounter: Payer: Self-pay | Admitting: Internal Medicine

## 2022-10-06 ENCOUNTER — Other Ambulatory Visit: Payer: Medicare HMO

## 2022-10-06 DIAGNOSIS — C61 Malignant neoplasm of prostate: Secondary | ICD-10-CM

## 2022-10-07 LAB — PSA: Prostate Specific Ag, Serum: 1.5 ng/mL (ref 0.0–4.0)

## 2022-10-10 NOTE — Progress Notes (Unsigned)
Charles [name] T. Shawnise Peterkin, MD, CAQ Sports Medicine [facility] at [facility] [address] [place], [address]  Phone: [phone]  FAX: [phone]  Charles [name] - 67 y.o. male  MRN [id number]  Date of Birth: May 07, 1955  Date: 10/12/2022  PCP: Charles Beat, MD  Referral: Charles Beat, MD  No chief complaint on file.  Subjective:   Charles [name] is a 67 y.o. very pleasant male patient with There is no height or weight on file to calculate BMI. who presents with the following:  [name] is here for a 69-month follow-up.  He is a very well-known patient, known for many years.  History is significant for management of ongoing B-cell lymphoma, he also has prostate cancer.  Relatively recent diagnosis of diabetes.  Diabetes Mellitus: Tolerating Medications: yes He is currently taking metformin 2000 mg a day Compliance with diet: fair, There is no height or weight on file to calculate BMI. Exercise: minimal / intermittent Avg blood sugars at home: not checking Foot problems: none Hypoglycemia: none No nausea, vomitting, blurred vision, polyuria.  Lab Results  Component Value Date   HGBA1C 7.1 (H) 04/01/2022   HGBA1C 8.5 (A) 01/14/2022   HGBA1C 7.2 (A) 07/14/2021   Lab Results  Component Value Date   MICROALBUR 0.8 04/01/2022   LDLCALC 60 04/01/2022   CREATININE 0.88 05/30/2022    Wt Readings from Last 3 Encounters:  09/24/22 219 lb (99.3 kg)  07/21/22 213 lb (96.6 kg)  07/20/22 220 lb (99.8 kg)    Lipids: Doing well, stable. Tolerating meds fine with no SE. Panel reviewed with patient.  Lipids: Lab Results  Component Value Date   CHOL 117 04/01/2022   Lab Results  Component Value Date   HDL 42.70 04/01/2022   Lab Results  Component Value Date   LDLCALC 60 04/01/2022   Lab Results  Component Value Date   TRIG 73.0 04/01/2022   Lab Results  Component Value Date   CHOLHDL 3 04/01/2022    Lab Results  Component Value  Date   ALT 36 05/26/2022   AST 33 05/26/2022   ALKPHOS 75 05/26/2022   BILITOT 0.9 05/26/2022    He also has been having some significant anxiety in the last year, and I did start him on Prozac 20 mg.  He also has had to take a few Ativan for emergent relief.  Review of Systems is noted in the HPI, as appropriate  Objective:   There were no vitals taken for this visit.  GEN: No acute distress; alert,appropriate. PULM: Breathing comfortably in no respiratory distress PSYCH: Normally interactive.   Laboratory and Imaging Data:  Assessment and Plan:   ***

## 2022-10-12 ENCOUNTER — Ambulatory Visit (INDEPENDENT_AMBULATORY_CARE_PROVIDER_SITE_OTHER): Payer: Medicare HMO | Admitting: Family Medicine

## 2022-10-12 ENCOUNTER — Ambulatory Visit: Payer: Medicare HMO | Admitting: Urology

## 2022-10-12 ENCOUNTER — Encounter: Payer: Self-pay | Admitting: Family Medicine

## 2022-10-12 VITALS — BP 120/68 | HR 65 | Temp 97.8°F | Ht 70.0 in | Wt 223.0 lb

## 2022-10-12 DIAGNOSIS — E119 Type 2 diabetes mellitus without complications: Secondary | ICD-10-CM | POA: Diagnosis not present

## 2022-10-12 DIAGNOSIS — E782 Mixed hyperlipidemia: Secondary | ICD-10-CM

## 2022-10-12 DIAGNOSIS — F411 Generalized anxiety disorder: Secondary | ICD-10-CM | POA: Diagnosis not present

## 2022-10-12 DIAGNOSIS — I10 Essential (primary) hypertension: Secondary | ICD-10-CM | POA: Diagnosis not present

## 2022-10-12 LAB — POCT GLYCOSYLATED HEMOGLOBIN (HGB A1C): Hemoglobin A1C: 6.6 % — AB (ref 4.0–5.6)

## 2022-10-12 MED ORDER — LORAZEPAM 0.5 MG PO TABS: 0.5000 mg | ORAL_TABLET | Freq: Three times a day (TID) | ORAL | 3 refills | Status: DC | PRN

## 2022-10-12 MED ORDER — SILDENAFIL CITRATE 100 MG PO TABS: 50.0000 mg | ORAL_TABLET | Freq: Every day | ORAL | 11 refills | Status: AC | PRN

## 2022-10-13 ENCOUNTER — Ambulatory Visit: Payer: Medicare HMO | Admitting: Internal Medicine

## 2022-10-13 ENCOUNTER — Encounter: Payer: Self-pay | Admitting: Internal Medicine

## 2022-10-13 ENCOUNTER — Encounter: Payer: Self-pay | Admitting: Hematology and Oncology

## 2022-10-13 VITALS — BP 103/61 | HR 53 | Temp 98.9°F | Resp 9 | Ht 70.0 in | Wt 219.0 lb

## 2022-10-13 DIAGNOSIS — C884 Extranodal marginal zone b-cell lymphoma of mucosa-associated lymphoid tissue (malt-lymphoma) not having achieved remission: Secondary | ICD-10-CM

## 2022-10-13 DIAGNOSIS — I10 Essential (primary) hypertension: Secondary | ICD-10-CM | POA: Diagnosis not present

## 2022-10-13 DIAGNOSIS — Z09 Encounter for follow-up examination after completed treatment for conditions other than malignant neoplasm: Secondary | ICD-10-CM | POA: Diagnosis not present

## 2022-10-13 DIAGNOSIS — Z860100 Personal history of colon polyps, unspecified: Secondary | ICD-10-CM

## 2022-10-13 DIAGNOSIS — F411 Generalized anxiety disorder: Secondary | ICD-10-CM | POA: Diagnosis not present

## 2022-10-13 DIAGNOSIS — D123 Benign neoplasm of transverse colon: Secondary | ICD-10-CM

## 2022-10-13 DIAGNOSIS — E119 Type 2 diabetes mellitus without complications: Secondary | ICD-10-CM | POA: Diagnosis not present

## 2022-10-13 MED ORDER — SODIUM CHLORIDE 0.9 % IV SOLN
500.0000 mL | Freq: Once | INTRAVENOUS | Status: DC
Start: 2022-10-13 — End: 2022-10-13

## 2022-10-13 NOTE — Progress Notes (Signed)
Pt's states no medical or surgical changes since previsit or office visit. 

## 2022-10-13 NOTE — Progress Notes (Signed)
GASTROENTEROLOGY PROCEDURE H&P NOTE   Primary Care Physician: Hannah Beat, MD    Reason for Procedure:  History of extranodal MALT involving the colon  Plan:    Colonoscopy  Patient is appropriate for endoscopic procedure(s) in the ambulatory ([facility]) setting.  The nature of the procedure, as well as the risks, benefits, and alternatives were carefully and thoroughly reviewed with the patient. Ample time for discussion and questions allowed. The patient understood, was satisfied, and agreed to proceed.     HPI: Charles [name] is a 67 y.o. male who presents for colonoscopy.  Medical history as below.  Tolerated the prep.  No recent chest pain or shortness of breath.  No abdominal pain today.  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   Neuromuscular disorder (HCC)    neuropathy feet   Prostate cancer (HCC) 04/02/2021   Tubular adenoma of colon    Ulcer 2004    Past Surgical History:  Procedure Laterality Date   ACHILLES TENDON REPAIR  2001   BICEPS TENDON REPAIR Left 2014   COLON SURGERY  2022   Polyps/ lymphoma tumor removed   COLONOSCOPY  2012   polyps-[place]   COSMETIC SURGERY  1995   Eye lids   HEMORROIDECTOMY  2012   NASAL SINUS SURGERY     09/14/22   ROTATOR CUFF REPAIR Left 2014   TOTAL HIP ARTHROPLASTY Right 12/07/2014   Procedure: RIGHT TOTAL HIP ARTHROPLASTY ANTERIOR APPROACH;  Surgeon: Kathryne Hitch, MD;  Location: [facility];  Service: Orthopedics;  Laterality: Right;   TOTAL HIP ARTHROPLASTY Left 05/29/2022   Procedure: LEFT TOTAL HIP ARTHROPLASTY ANTERIOR APPROACH;  Surgeon: Kathryne Hitch, MD;  Location: [facility];  Service:  Orthopedics;  Laterality: Left;    Prior to Admission medications   Medication Sig Start Date End Date Taking? Authorizing Provider  aspirin EC 81 MG tablet Take 81 mg by mouth daily. Swallow whole.   Yes [provider]  atorvastatin (LIPITOR) 80 MG tablet Take 1 tablet (80 mg total) by mouth daily. 11/20/21  Yes Little Ishikawa, MD  cetirizine (ZYRTEC) 10 MG tablet Take 10 mg by mouth daily. 01/05/17  Yes [provider]  Cholecalciferol (VITAMIN D) 50 MCG (2000 UT) tablet Take 2,000 Units by mouth daily.   Yes [provider]  CLINPRO 5000 1.1 % PSTE Place 1 Application onto teeth daily. 09/22/20  Yes [provider]  Cyanocobalamin (B-12) 5000 MCG CAPS Take 5,000 mcg by mouth daily.   Yes [provider]  Flaxseed, Linseed, (FLAX SEED OIL PO) Take 1 capsule by mouth daily.   Yes [provider]  FLUoxetine (PROZAC) 20 MG capsule TAKE 1 CAPSULE BY MOUTH EVERY DAY 07/13/22  Yes Copland, Karleen Hampshire, MD  fluticasone (FLONASE) 50 MCG/ACT nasal spray Place 2 sprays into both nostrils daily.   Yes [provider]  gabapentin (NEURONTIN) 300 MG capsule Take 3 capsules (900 mg total) by mouth at bedtime. 07/21/22  Yes Nita Sickle K, DO  glucose blood test strip SMARTSIG:Via Meter 1-2 Times Daily 08/27/22  Yes [provider]  Lancets (ONETOUCH DELICA PLUS LANCET30G) MISC USE TO CHECK BLOOD SUGAR UP TO 2 TIMES  A DAY. 05/12/22  Yes Copland, Karleen Hampshire, MD  LORazepam (ATIVAN) 0.5 MG tablet Take 1 tablet (0.5 mg total) by mouth every 8 (eight) hours as needed. for anxiety 10/12/22  Yes Copland, Karleen Hampshire, MD  losartan (COZAAR) 25 MG tablet TAKE 1 TABLET (25 MG TOTAL) BY MOUTH DAILY. 04/15/22  Yes Bedsole, Amy E, MD  metFORMIN (GLUCOPHAGE-XR) 500 MG 24 hr tablet Take 4 tablets (2,000 mg total) by mouth daily with breakfast. 05/04/22  Yes Copland, Karleen Hampshire, MD  methocarbamol (ROBAXIN) 500 MG tablet Take 1 tablet (500 mg total) by mouth every 6  (six) hours as needed for muscle spasms. 05/30/22  Yes Kathryne Hitch, MD  Multiple Vitamins-Minerals (MULTIVITAMIN WITH MINERALS) tablet Take 1 tablet by mouth daily.   Yes [provider]  Olopatadine HCl (PATADAY) 0.7 % SOLN Place 1 drop into both eyes daily.   Yes [provider]  pantoprazole (PROTONIX) 40 MG tablet Take 40 mg by mouth daily. 05/30/22  Yes [provider]  Polyethyl Glycol-Propyl Glycol (SYSTANE OP) Place 1 drop into both eyes as needed (dry eyes).   Yes [provider]  acetaminophen (TYLENOL) 500 MG tablet Take 1,000 mg by mouth every 6 (six) hours as needed for moderate pain.    [provider]  Alcohol Swabs (B-D SINGLE USE SWABS REGULAR) PADS Use to check blood sugar up to 2 times a day 04/07/21   Copland, Karleen Hampshire, MD  clobetasol ointment (TEMOVATE) 0.05 % Apply 1 Application topically 2 (two) times daily. Patient taking differently: Apply 1 Application topically 2 (two) times daily as needed (itching). 04/09/22   Copland, Karleen Hampshire, MD  ondansetron (ZOFRAN-ODT) 4 MG disintegrating tablet Take 4 mg by mouth every 8 (eight) hours as needed. 08/05/22   [provider]  oxyCODONE-acetaminophen (PERCOCET/ROXICET) 5-325 MG tablet Take 1 tablet by mouth every 4 (four) hours as needed. 08/05/22   [provider]  sildenafil (VIAGRA) 100 MG tablet Take 0.5-1 tablets (50-100 mg total) by mouth daily as needed for erectile dysfunction. 10/12/22   CoplandKarleen Hampshire, MD    Current Outpatient Medications  Medication Sig Dispense Refill   aspirin EC 81 MG tablet Take 81 mg by mouth daily. Swallow whole.     atorvastatin (LIPITOR) 80 MG tablet Take 1 tablet (80 mg total) by mouth daily. 90 tablet 3   cetirizine (ZYRTEC) 10 MG tablet Take 10 mg by mouth daily.     Cholecalciferol (VITAMIN D) 50 MCG (2000 UT) tablet Take 2,000 Units by mouth daily.     CLINPRO 5000 1.1 % PSTE Place 1 Application onto teeth daily.      Cyanocobalamin (B-12) 5000 MCG CAPS Take 5,000 mcg by mouth daily.     Flaxseed, Linseed, (FLAX SEED OIL PO) Take 1 capsule by mouth daily.     FLUoxetine (PROZAC) 20 MG capsule TAKE 1 CAPSULE BY MOUTH EVERY DAY 30 capsule 5   fluticasone (FLONASE) 50 MCG/ACT nasal spray Place 2 sprays into both nostrils daily.     gabapentin (NEURONTIN) 300 MG capsule Take 3 capsules (900 mg total) by mouth at bedtime. 270 capsule 1   glucose blood test strip SMARTSIG:Via Meter 1-2 Times Daily     Lancets (ONETOUCH DELICA PLUS LANCET30G) MISC USE TO CHECK BLOOD SUGAR UP TO 2 TIMES A DAY. 100 each 5   LORazepam (ATIVAN) 0.5 MG tablet Take 1 tablet (0.5 mg total) by mouth every 8 (eight) hours as needed. for anxiety 30 tablet 3   losartan (COZAAR) 25 MG  tablet TAKE 1 TABLET (25 MG TOTAL) BY MOUTH DAILY. 90 tablet 3   metFORMIN (GLUCOPHAGE-XR) 500 MG 24 hr tablet Take 4 tablets (2,000 mg total) by mouth daily with breakfast. 120 tablet 5   methocarbamol (ROBAXIN) 500 MG tablet Take 1 tablet (500 mg total) by mouth every 6 (six) hours as needed for muscle spasms. 30 tablet 1   Multiple Vitamins-Minerals (MULTIVITAMIN WITH MINERALS) tablet Take 1 tablet by mouth daily.     Olopatadine HCl (PATADAY) 0.7 % SOLN Place 1 drop into both eyes daily.     pantoprazole (PROTONIX) 40 MG tablet Take 40 mg by mouth daily.     Polyethyl Glycol-Propyl Glycol (SYSTANE OP) Place 1 drop into both eyes as needed (dry eyes).     acetaminophen (TYLENOL) 500 MG tablet Take 1,000 mg by mouth every 6 (six) hours as needed for moderate pain.     Alcohol Swabs (B-D SINGLE USE SWABS REGULAR) PADS Use to check blood sugar up to 2 times a day 100 each 5   clobetasol ointment (TEMOVATE) 0.05 % Apply 1 Application topically 2 (two) times daily. (Patient taking differently: Apply 1 Application topically 2 (two) times daily as needed (itching).) 30 g 1   ondansetron (ZOFRAN-ODT) 4 MG disintegrating tablet Take 4 mg by mouth every 8 (eight) hours as  needed.     oxyCODONE-acetaminophen (PERCOCET/ROXICET) 5-325 MG tablet Take 1 tablet by mouth every 4 (four) hours as needed.     sildenafil (VIAGRA) 100 MG tablet Take 0.5-1 tablets (50-100 mg total) by mouth daily as needed for erectile dysfunction. 5 tablet 11   Current Facility-Administered Medications  Medication Dose Route Frequency Provider Last Rate Last Admin   0.9 %  sodium chloride infusion  500 mL Intravenous Once Americus Perkey, Carie Caddy, MD        Allergies as of 10/13/2022   (No Known Allergies)    Family History  Problem Relation Age of Onset   Alcohol abuse Father    Heart attack Father    Hyperlipidemia Father    Stroke Father    Hypertension Father    Hearing loss Father    Heart disease Father    Breast cancer Mother 8   Hypertension Mother    Cancer Mother    Diabetes Mother    Obesity Mother    Stroke Mother    ADD / ADHD Brother    Alcohol abuse Brother    Anxiety disorder Brother    Hyperlipidemia Brother    Obesity Brother    Alcohol abuse Brother    Asthma Sister    Hyperlipidemia Sister    Obesity Sister    Colon cancer Neg Hx    Colon polyps Neg Hx    Esophageal cancer Neg Hx    Rectal cancer Neg Hx    Stomach cancer Neg Hx     Social History   Socioeconomic History   Marital status: Married    Spouse name: Andrey Campanile   Number of children: 1   Years of education: Not on file   Highest education level: Bachelor's degree (e.g., BA, AB, BS)  Occupational History   Occupation: insurance adj retired  Tobacco Use   Smoking status: Former    Current packs/day: 0.00    Average packs/day: 1 pack/day for 20.0 years (20.0 ttl pk-yrs)    Types: Cigarettes    Start date: 01/06/1983    Quit date: 01/06/2003    Years since quitting: 19.7   Smokeless tobacco: Former  Types: Dorna Bloom    Quit date: 01/06/2004   Tobacco comments:    Also used chewing tobacco formerly  Vaping Use   Vaping status: Never Used  Substance and Sexual Activity   Alcohol use: Not  Currently    Comment: Totally quit 2005   Drug use: No   Sexual activity: Yes    Partners: Female    Birth control/protection: None    Comment: Wife had tubal ligation  Other Topics Concern   Not on file  Social History Narrative   Married, Web designer baseball player   Avid golfer      Are you right handed or left handed? Right Handed    Are you currently employed ? 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 No   Who lives with you? With Wife    What type of home do you live in: 1 story or 2 story? Two story home          Wife is a retired Magazine features editor Strain: Low Risk  (03/25/2022)   Overall Financial Resource Strain (CARDIA)    Difficulty of Paying Living Expenses: Not hard at all  Food Insecurity: No Food Insecurity (05/29/2022)   Hunger Vital Sign    Worried About Running Out of Food in the Last Year: Never true    Ran Out of Food in the Last Year: Never true  Transportation Needs: No Transportation Needs (05/29/2022)   PRAPARE - Administrator, Civil Service (Medical): No    Lack of Transportation (Non-Medical): No  Physical Activity: Sufficiently Active (03/25/2022)   Exercise Vital Sign    Days of Exercise per Week: 5 days    Minutes of Exercise per Session: 150+ min  Stress: No Stress Concern Present (03/25/2022)   [employer] of Occupational Health - Occupational Stress Questionnaire    Feeling of Stress : Not at all  Social Connections: Socially Integrated (03/25/2022)   Social Connection and Isolation Panel [NHANES]    Frequency of Communication with Friends and Family: More than three times a week    Frequency of Social Gatherings with Friends and Family: More than three times a week    Attends Religious Services: More than 4 times per year    Active Member of [employer] or Organizations: Yes    Attends Hospital doctor: More than 4 times per year    Marital Status: Married  Catering manager Violence: Not At Risk (05/29/2022)   Humiliation, Afraid, Rape, and Kick questionnaire    Fear of Current or Ex-Partner: No    Emotionally Abused: No    Physically Abused: No    Sexually Abused: No    Physical Exam: Vital signs in last 24 hours: @BP  121/71   Pulse 62   Temp 98.9 F (37.2 C)   Ht 5\' 10"  (1.778 m)   Wt 219 lb (99.3 kg)   SpO2 97%   BMI 31.42 kg/m  GEN: NAD EYE: Sclerae anicteric ENT: MMM CV: Non-tachycardic Pulm: CTA b/l GI: Soft, NT/ND NEURO:  Alert & Oriented x 3   Erick Blinks, MD  Gastroenterology  10/13/2022 8:48 AM

## 2022-10-13 NOTE — Op Note (Signed)
[facility] Patient Name: Charles [name] Procedure Date: 10/13/2022 8:45 AM MRN: [id number] Endoscopist: Beverley Fiedler , MD, [id number] Age: 67 Referring MD:  Date of Birth: 1955-10-31 Gender: Male Account #: [id number] Procedure:                Colonoscopy Indications:              High risk colon cancer surveillance: Personal                            history of extranodal MALT of transverse colon                            (2022), Last colonoscopy: September 2023, history                            of non-advanced adenoma (2022) Medicines:                Monitored Anesthesia Care Procedure:                Pre-Anesthesia Assessment:                           - Prior to the procedure, a History and Physical                            was performed, and patient medications and                            allergies were reviewed. The patient's tolerance of                            previous anesthesia was also reviewed. The risks                            and benefits of the procedure and the sedation                            options and risks were discussed with the patient.                            All questions were answered, and informed consent                            was obtained. Prior Anticoagulants: The patient has                            taken no anticoagulant or antiplatelet agents. ASA                            Grade Assessment: II - A patient with mild systemic                            disease. After reviewing the risks and benefits,  the patient was deemed in satisfactory condition to                            undergo the procedure.                           After obtaining informed consent, the colonoscope                            was passed under direct vision. Throughout the                            procedure, the patient's blood pressure, pulse, and                            oxygen saturations were monitored  continuously. The                            CF [id number] #[id number] was introduced through the anus                            and advanced to the cecum, identified by                            appendiceal orifice and ileocecal valve. The                            colonoscopy was performed without difficulty. The                            patient tolerated the procedure well. The quality                            of the bowel preparation was excellent. The                            ileocecal valve, appendiceal orifice, and rectum                            were photographed. Scope In: 8:54:37 AM Scope Out: 9:10:27 AM Scope Withdrawal Time: 0 hours 13 minutes 21 seconds  Total Procedure Duration: 0 hours 15 minutes 50 seconds  Findings:                 The digital rectal exam was normal.                           A 5 mm polyp was found in the transverse colon. The                            polyp was sessile. The polyp was removed with a                            cold snare. Resection and retrieval were complete.  A tattoo was seen in the proximal transverse colon.                            A post-polypectomy scar was found at the tattoo                            site. There was no evidence of residual polyp                            tissue and no evidence of abnormal mucosa.                           Multiple medium-mouthed and small-mouthed                            diverticula were found in the sigmoid colon,                            descending colon and transverse colon.                           Internal hemorrhoids were found during                            retroflexion. The hemorrhoids were small. Complications:            No immediate complications. Estimated Blood Loss:     Estimated blood loss: none. Impression:               - One 5 mm polyp in the transverse colon, removed                            with a cold snare. Resected and  retrieved.                           - A tattoo was seen in the proximal transverse                            colon. A post-polypectomy scar was found at the                            tattoo site. There was no evidence of residual                            polyp tissue or abnormal mucosa.                           - Moderate diverticulosis in the sigmoid colon, in                            the descending colon and in the transverse colon.                           - Small internal hemorrhoids. Recommendation:           -  Patient has a contact number available for                            emergencies. The signs and symptoms of potential                            delayed complications were discussed with the                            patient. Return to normal activities tomorrow.                            Written discharge instructions were provided to the                            patient.                           - Resume previous diet.                           - Continue present medications.                           - Await pathology results.                           - Repeat colonoscopy is recommended for                            surveillance. The colonoscopy date will be                            determined after pathology results from today's                            exam become available for review.                           - Will involve Dr. Bertis Ruddy for her opinion                            regarding surveillance colonoscopy interval given                            history of extranodal MALT. Beverley Fiedler, MD 10/13/2022 9:17:02 AM This report has been signed electronically. CC Letter to:             [employer]

## 2022-10-13 NOTE — Patient Instructions (Signed)
-   Resume previous diet. - Continue present medications. - Await pathology results. - Repeat colonoscopy is recommended for surveillance. The colonoscopy date will be determined after pathology results from today's exam become available for review. - Will involve Dr. Bertis Ruddy for her opinion regarding surveillance colonoscopy interval given history of extranodal MALT. - Educational handouts given related to procedure.    YOU HAD AN ENDOSCOPIC PROCEDURE TODAY AT THE [facility]:   Refer to the procedure report that was given to you for any specific questions about what was found during the examination.  If the procedure report does not answer your questions, please call your gastroenterologist to clarify.  If you requested that your care partner not be given the details of your procedure findings, then the procedure report has been included in a sealed envelope for you to review at your convenience later.  YOU SHOULD EXPECT: Some feelings of bloating in the abdomen. Passage of more gas than usual.  Walking can help get rid of the air that was put into your GI tract during the procedure and reduce the bloating. If you had a lower endoscopy (such as a colonoscopy or flexible sigmoidoscopy) you may notice spotting of blood in your stool or on the toilet paper. If you underwent a bowel prep for your procedure, you may not have a normal bowel movement for a few days.  Please Note:  You might notice some irritation and congestion in your nose or some drainage.  This is from the oxygen used during your procedure.  There is no need for concern and it should clear up in a day or so.  SYMPTOMS TO REPORT IMMEDIATELY:  Following lower endoscopy (colonoscopy or flexible sigmoidoscopy):  Excessive amounts of blood in the stool  Significant tenderness or worsening of abdominal pains  Swelling of the abdomen that is new, acute  Fever of 100F or higher  For urgent or emergent issues, a  gastroenterologist can be reached at any hour by calling [phone]. Do not use MyChart messaging for urgent concerns.    DIET:  We do recommend a small meal at first, but then you may proceed to your regular diet.  Drink plenty of fluids but you should avoid alcoholic beverages for 24 hours.  ACTIVITY:  You should plan to take it easy for the rest of today and you should NOT DRIVE or use heavy machinery until tomorrow (because of the sedation medicines used during the test).    FOLLOW UP: Our staff will call the number listed on your records the next business day following your procedure.  We will call around 7:15- 8:00 am to check on you and address any questions or concerns that you may have regarding the information given to you following your procedure. If we do not reach you, we will leave a message.     If any biopsies were taken you will be contacted by phone or by letter within the next 1-3 weeks.  Please call us at [phone] if you have not heard about the biopsies in 3 weeks.    SIGNATURES/CONFIDENTIALITY: You and/or your care partner have signed paperwork which will be entered into your electronic medical record.  These signatures attest to the fact that that the information above on your After Visit Summary has been reviewed and is understood.  Full responsibility of the confidentiality of this discharge information lies with you and/or your care-partner.

## 2022-10-13 NOTE — Progress Notes (Signed)
Report to PACU, RN, vss, BBS= Clear.  

## 2022-10-13 NOTE — Progress Notes (Signed)
Called to room to assist during endoscopic procedure.  Patient ID and intended procedure confirmed with present staff. Received instructions for my participation in the procedure from the performing physician.  

## 2022-10-14 ENCOUNTER — Telehealth: Payer: Self-pay

## 2022-10-14 ENCOUNTER — Telehealth: Payer: Self-pay | Admitting: *Deleted

## 2022-10-14 NOTE — Telephone Encounter (Signed)
Wife called back and agreed to moving appts to 10/14 for Charles [name].  She is aware of appt times/date.

## 2022-10-14 NOTE — Telephone Encounter (Signed)
No answer on  follow up call. Left message.   

## 2022-10-14 NOTE — Telephone Encounter (Signed)
Called regarding mychart message and concern about recent colonoscopy for Charles [name]. Offered appt with Dr. Bertis Ruddy on 10/14 at 1:!5 pm for 45 mins. Ask her to call the office back.

## 2022-10-15 ENCOUNTER — Ambulatory Visit: Payer: Medicare HMO | Admitting: Urology

## 2022-10-15 ENCOUNTER — Encounter: Payer: Self-pay | Admitting: Internal Medicine

## 2022-10-15 ENCOUNTER — Encounter: Payer: Self-pay | Admitting: Urology

## 2022-10-15 VITALS — BP 143/72 | HR 56 | Ht 70.0 in | Wt 219.0 lb

## 2022-10-15 DIAGNOSIS — C61 Malignant neoplasm of prostate: Secondary | ICD-10-CM

## 2022-10-15 LAB — SURGICAL PATHOLOGY

## 2022-10-15 LAB — HM DIABETES EYE EXAM

## 2022-10-15 NOTE — Progress Notes (Signed)
04/16/2021 12:49 PM    Charles [name] 1955-03-25 [id number]   Reason for visit: Low risk prostate cancer   HPI: 67 year old male diagnosed with B-cell lymphoma on colonoscopy and underwent a PET/CT for staging imaging.  This showed no abnormalities aside from a focal area of intake in the left hemiprostate.  PSA was 1.8 and he opted for prostate biopsy.   Biopsy on 10/02/2020 showed a 46 g prostate with a PSA density of 0.04, no abnormalities seen on transrectal ultrasound.  Biopsies showed only 1/12 cores positive for Gleason score 3+3= 6 prostate adenocarcinoma with maximal involvement of 10%, that correlated with the location of the abnormality on PET/CT in the left side.   He opted for active surveillance with his low risk disease, normal PSA, and only single core showing low risk disease.  PSA remains stable and he has also had a prostate MRI in March 2023 that showed a 45 g prostate with no suspicious areas that correlated with the PET/CT findings, and a PI-RADS 3 lesion in the right posterior peripheral zone that was very subtle.  PSA remains stable and low, most recently 1.5 from October 2024.  He denies any significant urinary symptoms.  He would like to continue active surveillance which is very reasonable.   RTC 9 months PSA prior, consider repeat biopsy in 1 to 2 years   Sondra Come, MD   [facility] [address] [place], [place] [address] [phone]

## 2022-10-19 ENCOUNTER — Encounter: Payer: Self-pay | Admitting: Hematology and Oncology

## 2022-10-19 ENCOUNTER — Inpatient Hospital Stay: Payer: Medicare HMO | Attending: Hematology and Oncology | Admitting: Hematology and Oncology

## 2022-10-19 ENCOUNTER — Inpatient Hospital Stay: Payer: Medicare HMO

## 2022-10-19 VITALS — BP 137/75 | HR 65 | Temp 97.5°F | Resp 18 | Ht 70.0 in | Wt 221.0 lb

## 2022-10-19 DIAGNOSIS — J329 Chronic sinusitis, unspecified: Secondary | ICD-10-CM | POA: Insufficient documentation

## 2022-10-19 DIAGNOSIS — C8580 Other specified types of non-Hodgkin lymphoma, unspecified site: Secondary | ICD-10-CM | POA: Diagnosis not present

## 2022-10-19 DIAGNOSIS — C61 Malignant neoplasm of prostate: Secondary | ICD-10-CM | POA: Insufficient documentation

## 2022-10-19 DIAGNOSIS — C884 Extranodal marginal zone b-cell lymphoma of mucosa-associated lymphoid tissue (malt-lymphoma) not having achieved remission: Secondary | ICD-10-CM | POA: Insufficient documentation

## 2022-10-19 DIAGNOSIS — E119 Type 2 diabetes mellitus without complications: Secondary | ICD-10-CM | POA: Insufficient documentation

## 2022-10-19 LAB — CBC WITH DIFFERENTIAL/PLATELET
Abs Immature Granulocytes: 0.01 10*3/uL (ref 0.00–0.07)
Basophils Absolute: 0.1 10*3/uL (ref 0.0–0.1)
Basophils Relative: 1 %
Eosinophils Absolute: 0.5 10*3/uL (ref 0.0–0.5)
Eosinophils Relative: 9 %
HCT: 40.3 % (ref 39.0–52.0)
Hemoglobin: 13.3 g/dL (ref 13.0–17.0)
Immature Granulocytes: 0 %
Lymphocytes Relative: 15 %
Lymphs Abs: 0.9 10*3/uL (ref 0.7–4.0)
MCH: 30.2 pg (ref 26.0–34.0)
MCHC: 33 g/dL (ref 30.0–36.0)
MCV: 91.6 fL (ref 80.0–100.0)
Monocytes Absolute: 0.6 10*3/uL (ref 0.1–1.0)
Monocytes Relative: 10 %
Neutro Abs: 3.5 10*3/uL (ref 1.7–7.7)
Neutrophils Relative %: 65 %
Platelets: 202 10*3/uL (ref 150–400)
RBC: 4.4 MIL/uL (ref 4.22–5.81)
RDW: 13.7 % (ref 11.5–15.5)
WBC: 5.6 10*3/uL (ref 4.0–10.5)
nRBC: 0 % (ref 0.0–0.2)

## 2022-10-19 LAB — COMPREHENSIVE METABOLIC PANEL
ALT: 37 U/L (ref 0–44)
AST: 29 U/L (ref 15–41)
Albumin: 4.1 g/dL (ref 3.5–5.0)
Alkaline Phosphatase: 120 U/L (ref 38–126)
Anion gap: 6 (ref 5–15)
BUN: 17 mg/dL (ref 8–23)
CO2: 26 mmol/L (ref 22–32)
Calcium: 9.1 mg/dL (ref 8.9–10.3)
Chloride: 107 mmol/L (ref 98–111)
Creatinine, Ser: 0.9 mg/dL (ref 0.61–1.24)
GFR, Estimated: >60 mL/min (ref >60)
Glucose, Bld: 104 mg/dL — ABNORMAL HIGH (ref 70–99)
Potassium: 4.3 mmol/L (ref 3.5–5.1)
Sodium: 139 mmol/L (ref 135–145)
Total Bilirubin: 0.7 mg/dL (ref 0.3–1.2)
Total Protein: 6.9 g/dL (ref 6.5–8.1)

## 2022-10-19 NOTE — Progress Notes (Signed)
[facility] OFFICE PROGRESS NOTE  Patient Care Team: Hannah Beat, MD as PCP - General (Family Medicine) Glendale Chard, DO as Consulting Physician (Neurology)  HISTORY OF PRESENTING ILLNESS: Discussed the use of AI scribe software for clinical note transcription with the patient, who gave verbal consent to proceed.  History of Present Illness   The patient, with a history of marginal zone lymphoma, presents for a follow-up consultation after recent colonoscopy and immunotherapy. The colonoscopy report indicated a small polyp was removed and biopsied, with results coming back clear. The patient expressed concerns about a spot identified on a previous PET scan, which was not biopsied during the colonoscopy. The patient's bowel habits have remained the same, with no signs of malabsorption or significant weight loss.  The patient underwent immunotherapy last year, an expressed concerns of no clear indication of its effectiveness. The patient expressed dissatisfaction with the uncertainty surrounding the effectiveness of the treatment. The patient also expressed concerns about a spot identified on a previous PET scan, which was not biopsied during the colonoscopy.  The patient has been experiencing ongoing gastrointestinal issues, with frequent bowel movements. However, these symptoms have been consistent and were present before the initiation of treatment. The patient has not experienced any significant weight loss, malabsorption, or pain, which could indicate active lymphoma in the bowels.  The patient has been taking metformin, which may have influenced the results of the PET scan.  The patient's overall health appears good, with no signs of lymphoma recurrence. However, the patient expressed a desire for further testing to confirm the effectiveness of the immunotherapy and to investigate the spot identified on the previous PET scan.         Assessment and Plan    MALT  Lymphoma Recent colonoscopy showed only a small polyp which was removed and found to be clear. Patient and spouse expressed concern about the effectiveness of recent immunotherapy (Rituxan) and the presence of a new spot on a recent PET scan. Discussed the limitations of PET scans and colonoscopies in diagnosing and monitoring MALT lymphoma. No signs of malabsorption or other symptoms suggestive of active lymphoma. -Discontinue Metformin for 10 days. -Order PET scan in 10 days to reassess for any active lymphoma. -Follow-up appointment on 11/05/2022 to discuss PET scan results.  Diabetes Patient is on Metformin. Discussed the potential impact of Metformin on PET scan results. -Monitor blood sugars while off Metformin for the next 10 days.          Orders Placed This Encounter  Procedures   NM PET Image Restag (PS) Skull Base To Thigh    Standing Status:   Future    Standing Expiration Date:   10/19/2023    Order Specific Question:   If indicated for the ordered procedure, I authorize the administration of a radiopharmaceutical per Radiology protocol    Answer:   Yes    Order Specific Question:   Preferred imaging location?    Answer:   [facility]    All questions were answered. The patient knows to call the clinic with any problems, questions or concerns. The total time spent in the appointment was 40 minutes encounter with patients including review of chart and various tests results, discussions about plan of care and coordination of care plan   Artis Delay, MD 10/19/2022 1:35 PM  REVIEW OF SYSTEMS:  All other systems were reviewed with the patient and are negative.  I have reviewed the past medical history, past surgical history,  social history and family history with the patient and they are unchanged from previous note.  ALLERGIES:  has No Known Allergies.  MEDICATIONS:  Current Outpatient Medications  Medication Sig Dispense Refill   acetaminophen (TYLENOL) 500 MG tablet  Take 1,000 mg by mouth every 6 (six) hours as needed for moderate pain.     Alcohol Swabs (B-D SINGLE USE SWABS REGULAR) PADS Use to check blood sugar up to 2 times a day 100 each 5   aspirin EC 81 MG tablet Take 81 mg by mouth daily. Swallow whole.     atorvastatin (LIPITOR) 80 MG tablet Take 1 tablet (80 mg total) by mouth daily. 90 tablet 3   cetirizine (ZYRTEC) 10 MG tablet Take 10 mg by mouth daily.     Cholecalciferol (VITAMIN D) 50 MCG (2000 UT) tablet Take 2,000 Units by mouth daily.     CLINPRO 5000 1.1 % PSTE Place 1 Application onto teeth daily.     clobetasol ointment (TEMOVATE) 0.05 % Apply 1 Application topically 2 (two) times daily. (Patient taking differently: Apply 1 Application topically 2 (two) times daily as needed (itching).) 30 g 1   Cyanocobalamin (B-12) 5000 MCG CAPS Take 5,000 mcg by mouth daily.     Flaxseed, Linseed, (FLAX SEED OIL PO) Take 1 capsule by mouth daily.     FLUoxetine (PROZAC) 20 MG capsule TAKE 1 CAPSULE BY MOUTH EVERY DAY 30 capsule 5   fluticasone (FLONASE) 50 MCG/ACT nasal spray Place 2 sprays into both nostrils daily.     gabapentin (NEURONTIN) 300 MG capsule Take 3 capsules (900 mg total) by mouth at bedtime. 270 capsule 1   glucose blood test strip SMARTSIG:Via Meter 1-2 Times Daily     Lancets (ONETOUCH DELICA PLUS LANCET30G) MISC USE TO CHECK BLOOD SUGAR UP TO 2 TIMES A DAY. 100 each 5   LORazepam (ATIVAN) 0.5 MG tablet Take 1 tablet (0.5 mg total) by mouth every 8 (eight) hours as needed. for anxiety 30 tablet 3   losartan (COZAAR) 25 MG tablet TAKE 1 TABLET (25 MG TOTAL) BY MOUTH DAILY. 90 tablet 3   metFORMIN (GLUCOPHAGE-XR) 500 MG 24 hr tablet Take 4 tablets (2,000 mg total) by mouth daily with breakfast. 120 tablet 5   methocarbamol (ROBAXIN) 500 MG tablet Take 1 tablet (500 mg total) by mouth every 6 (six) hours as needed for muscle spasms. 30 tablet 1   Multiple Vitamins-Minerals (MULTIVITAMIN WITH MINERALS) tablet Take 1 tablet by mouth  daily.     Olopatadine HCl (PATADAY) 0.7 % SOLN Place 1 drop into both eyes daily.     ondansetron (ZOFRAN-ODT) 4 MG disintegrating tablet Take 4 mg by mouth every 8 (eight) hours as needed.     oxyCODONE-acetaminophen (PERCOCET/ROXICET) 5-325 MG tablet Take 1 tablet by mouth every 4 (four) hours as needed.     pantoprazole (PROTONIX) 40 MG tablet Take 40 mg by mouth daily.     Polyethyl Glycol-Propyl Glycol (SYSTANE OP) Place 1 drop into both eyes as needed (dry eyes).     sildenafil (VIAGRA) 100 MG tablet Take 0.5-1 tablets (50-100 mg total) by mouth daily as needed for erectile dysfunction. 5 tablet 11   No current facility-administered medications for this visit.    SUMMARY OF ONCOLOGIC HISTORY: Oncology History  Marginal zone B-cell lymphoma (HCC)  05/21/2020 Procedure   Colonoscopy by Dr. Rhea Belton - One 4 mm polyp in the ascending colon, removed with a cold snare. Resected and retrieved. - One 25 mm polyp  in the proximal transverse colon, removed piecemeal using a cold snare. Resected and retrieved. Tattooed. - Diverticulosis from cecum to sigmoid colon. - The distal rectum and anal verge are normal on retroflexion view.   05/21/2020 Pathology Results   1. Surgical [P], colon, ascending, polyp (1) - TUBULAR ADENOMA. - NO HIGH GRADE DYSPLASIA OR CARCINOMA. 2. Surgical [P], colon, transverse, polyp (1) - ATYPICAL LYMPHOID PROLIFERATION. - SEE MICROSCOPIC DESCRIPTION. Microscopic Comment 2. The sections show a dense, diffuse and relatively monomorphic lymphoid infiltrate involving the lamina propria and submucosa and mostly characterized by small to medium sized lymphocytes with round to slightly irregular nuclei and moderately abundant clear cytoplasm imparting a monocytoid appearance. The infiltrate displays scattered small foci of lymphoepithelial lesions and occasionally surround very small "naked" germinal centers. A battery of immunohistochemical stains was performed and shows that  the lymphoid infiltrate is predominantly composed of B cells as seen with CD20 associated with diffuse positivity for Bcl-2 and CD43. No significant CD10 or cyclin D1 positivity is identified. BCL6 highlights scattering of small positive clusters correlating with previously described germinal centers. Ki-67 shows very low expression (less than 10%). In situ hybridization for kappa and lambda highlight a polyclonal superficial plasma cell component. The findings are highly suspicious for involvement by low-grade B-cell lymphoma particularly extranodal marginal zone lymphoma. FISH and gene rearrangement study studies will be performed and the results reported in an addendum. Dr. Laureen Ochs reviewed this part and cocnurs. Jimmy Picket MD Pathologist, Electronic Signature (Case signed 05/27/2020)    06/24/2020 PET scan   No signs of nodal disease in the neck, chest, abdomen or pelvis.   Focal area of uptake in the LEFT hemi prostate, nonspecific but raising the question of prostate neoplasm. Urologic consultation is suggested for further evaluation.   Aortic atherosclerosis and coronary artery disease.   Maxillary sinus disease.   Aortic Atherosclerosis (ICD10-I70.0).   06/24/2020 Cancer Staging   Staging form: Hodgkin and Non-Hodgkin Lymphoma, AJCC 8th Edition - Clinical stage from 06/24/2020: Stage IE (Marginal zone lymphoma) - Signed by Artis Delay, MD on 06/24/2020 Stage prefix: Initial diagnosis   09/30/2021 Procedure   - The examined portion of the ileum was normal. - One 1 mm polyp in the cecum, removed with a cold biopsy forceps. Resected and retrieved. - Post-polypectomy scar with distal tattoo in the proximal transverse colon. No evidence of residual polyp/tumor. Biopsied. - One 4 mm polyp in the transverse colon, removed with a cold snare. Complete resection. Polyp tissue not retrieved. - Moderate diverticulosis from ascending colon to sigmoid colon. - The distal rectum and anal verge are  normal on retroflexion view.   09/30/2021 Pathology Results   Diagnosis 1. Surgical [P], colon, cecum, polyp (1) - POLYPOID FRAGMENT OF BENIGN COLONIC MUCOSA WITH NO SIGNIFICANT PATHOLOGIC CHANGES 2. Surgical [P], proximal transverse colon scar - ATYPICAL LYMPHOID INFILTRATE (SEE NOTE) Diagnosis Note 2. - Morphologic evaluation reveals colonic mucosa with a single prominent lymphoid aggregate, and no lymphoepithelial lesions. The cells are positive for CD20, CD43 and negative for CD3. Definitive clonality cannot be established given the lack of kappa and lambda ISH staining. Normal colonic mucosa is seen elsewhere. The findings are not diagnostic of, however in the given clinical context of the patient's history of extranodal MALT lymphoma, are suspicious for involvement. Dr. Kenyon Ana has reviewed the case and agrees with the interpretation. Clinical and radiologic correlation is recommended.   10/31/2021 PET scan   1. No hypermetabolic adenopathy above or below the diaphragm. 2. No  splenomegaly or evidence of osseous lymphomatous involvement 3. Increased short segment uptake in the sigmoid colon along an area of diverticulosis with some associated wall thickening but no adjacent inflammation. While this may reflect physiologic activity versus Segmental colitis associated with diverticulosis, an underlying colonic mass is a differential consideration not excluded on this study, recommend correlation with history of colon cancer screening and colonoscopy if clinically indicated. 4.  Aortic Atherosclerosis (ICD10-I70.0).   11/24/2021 - 12/15/2021 Chemotherapy   Patient is on Treatment Plan : NON-HODGKINS LYMPHOMA Rituximab q7d     03/16/2022 PET scan   1. No convincing evidence of metabolically active lymphomatous disease. 2. Mildly metabolic opacification of the left maxillary sinus and mucosal thickening of the right maxillary sinus, suggestive of sinusitis. 3. Similar right-sided predominant  heterogeneous low-level FDG avidity in an enlarged prostate, component of which may reflect patient's known prostate carcinoma. 4. Stable tiny scattered bilateral pulmonary nodules stable over multiple prior examinations and non FDG avid suggestive of a benign etiology. 5. Diffuse colonic FDG avidity is commonly physiologic/medication-related (metformin). 6.  Aortic Atherosclerosis (ICD10-I70.0).   10/13/2022 Procedure   Findings:             The digital rectal exam was normal. A 5 mm polyp was found in the transverse colon. The polyp was sessile. The polyp was removed with a cold snare. Resection and retrieval were complete. A tattoo was seen in the proximal transverse colon. A post-polypectomy scar was found at the tattoo site. There was no evidence of residual polyp tissue and no evidence of abnormal mucosa. Multiple medium-mouthed and small-mouthed diverticula were found in the sigmoid colon, descending colon and transverse colon. Internal hemorrhoids were found during  retroflexion. The hemorrhoids were small. Complications:            No immediate complications. Estimated Blood Loss:     Estimated blood loss: none. Impression:               - One 5 mm polyp in the transverse colon, removed                            with a cold snare. Resected and retrieved.                           - A tattoo was seen in the proximal transverse                            colon. A post-polypectomy scar was found at the                            tattoo site. There was no evidence of residual                            polyp tissue or abnormal mucosa.                           - Moderate diverticulosis in the sigmoid colon, in                            the descending colon and in the transverse colon.                           -  Small internal hemorrhoids.   Prostate cancer (HCC)  04/02/2021 Initial Diagnosis   Prostate cancer (HCC)   06/24/2021 Cancer Staging   Staging form: Prostate, AJCC 8th  Edition - Clinical stage from 06/24/2021: cT1, cN0, cM0, Grade Group: 1 - Signed by Artis Delay, MD on 06/24/2021 Stage prefix: Initial diagnosis Histologic grading system: 5 grade system     PHYSICAL EXAMINATION: ECOG PERFORMANCE STATUS: 0 - Asymptomatic  Vitals:   10/19/22 1250  BP: 137/75  Pulse: 65  Resp: 18  Temp: (!) 97.5 F (36.4 C)  SpO2: 97%   Filed Weights   10/19/22 1250  Weight: 221 lb (100.2 kg)    GENERAL:alert, no distress and comfortable  LABORATORY DATA:  I have reviewed the data as listed    Component Value Date/Time   NA 139 10/19/2022 1222   NA 139 01/11/2020 1249   K 4.3 10/19/2022 1222   CL 107 10/19/2022 1222   CO2 26 10/19/2022 1222   GLUCOSE 104 (H) 10/19/2022 1222   BUN 17 10/19/2022 1222   BUN 14 01/11/2020 1249   CREATININE 0.90 10/19/2022 1222   CREATININE 1.17 11/21/2021 1422   CALCIUM 9.1 10/19/2022 1222   PROT 6.9 10/19/2022 1222   ALBUMIN 4.1 10/19/2022 1222   AST 29 10/19/2022 1222   AST 24 11/21/2021 1422   ALT 37 10/19/2022 1222   ALT 34 11/21/2021 1422   ALKPHOS 120 10/19/2022 1222   BILITOT 0.7 10/19/2022 1222   BILITOT 0.8 11/21/2021 1422   GFRNONAA >60 10/19/2022 1222   GFRNONAA >60 11/21/2021 1422   GFRAA 91 01/11/2020 1249    No results found for: "SPEP", "UPEP"  Lab Results  Component Value Date   WBC 5.6 10/19/2022   NEUTROABS 3.5 10/19/2022   HGB 13.3 10/19/2022   HCT 40.3 10/19/2022   MCV 91.6 10/19/2022   PLT 202 10/19/2022      Chemistry      Component Value Date/Time   NA 139 10/19/2022 1222   NA 139 01/11/2020 1249   K 4.3 10/19/2022 1222   CL 107 10/19/2022 1222   CO2 26 10/19/2022 1222   BUN 17 10/19/2022 1222   BUN 14 01/11/2020 1249   CREATININE 0.90 10/19/2022 1222   CREATININE 1.17 11/21/2021 1422      Component Value Date/Time   CALCIUM 9.1 10/19/2022 1222   ALKPHOS 120 10/19/2022 1222   AST 29 10/19/2022 1222   AST 24 11/21/2021 1422   ALT 37 10/19/2022 1222   ALT 34  11/21/2021 1422   BILITOT 0.7 10/19/2022 1222   BILITOT 0.8 11/21/2021 1422

## 2022-10-22 ENCOUNTER — Ambulatory Visit: Payer: Medicare HMO | Admitting: Hematology and Oncology

## 2022-10-22 ENCOUNTER — Other Ambulatory Visit: Payer: Medicare HMO

## 2022-10-30 ENCOUNTER — Encounter (HOSPITAL_COMMUNITY)
Admission: RE | Admit: 2022-10-30 | Discharge: 2022-10-30 | Disposition: A | Discharge status: Home / Self Care | Payer: Medicare HMO | Source: Ambulatory Visit | Attending: Hematology and Oncology

## 2022-10-30 ENCOUNTER — Encounter: Payer: Self-pay | Admitting: Primary Care

## 2022-10-30 DIAGNOSIS — C8516 Unspecified B-cell lymphoma, intrapelvic lymph nodes: Secondary | ICD-10-CM | POA: Diagnosis not present

## 2022-10-30 DIAGNOSIS — C8580 Other specified types of non-Hodgkin lymphoma, unspecified site: Secondary | ICD-10-CM | POA: Diagnosis not present

## 2022-10-30 LAB — GLUCOSE, CAPILLARY: Glucose-Capillary: 122 mg/dL — ABNORMAL HIGH (ref 70–99)

## 2022-10-30 MED ORDER — FLUDEOXYGLUCOSE F - 18 (FDG) INJECTION
11.0200 | Freq: Once | INTRAVENOUS | Status: AC
  Administered 2022-10-30: 11.02 via INTRAVENOUS

## 2022-11-04 ENCOUNTER — Encounter: Payer: Self-pay | Admitting: Hematology and Oncology

## 2022-11-05 ENCOUNTER — Inpatient Hospital Stay: Payer: Medicare HMO | Admitting: Hematology and Oncology

## 2022-11-05 ENCOUNTER — Encounter: Payer: Self-pay | Admitting: Hematology and Oncology

## 2022-11-05 VITALS — BP 115/68 | HR 61 | Temp 97.5°F | Resp 18 | Ht 70.0 in | Wt 220.0 lb

## 2022-11-05 DIAGNOSIS — C884 Extranodal marginal zone b-cell lymphoma of mucosa-associated lymphoid tissue (malt-lymphoma) not having achieved remission: Secondary | ICD-10-CM | POA: Diagnosis not present

## 2022-11-05 DIAGNOSIS — C61 Malignant neoplasm of prostate: Secondary | ICD-10-CM

## 2022-11-05 DIAGNOSIS — E119 Type 2 diabetes mellitus without complications: Secondary | ICD-10-CM | POA: Diagnosis not present

## 2022-11-05 DIAGNOSIS — J329 Chronic sinusitis, unspecified: Secondary | ICD-10-CM | POA: Diagnosis not present

## 2022-11-05 DIAGNOSIS — C8580 Other specified types of non-Hodgkin lymphoma, unspecified site: Secondary | ICD-10-CM | POA: Diagnosis not present

## 2022-11-05 NOTE — Progress Notes (Signed)
[facility] OFFICE PROGRESS NOTE  Patient Care Team: Hannah Beat, MD as PCP - General (Family Medicine) Glendale Chard, DO as Consulting Physician (Neurology)  ASSESSMENT & PLAN:  Marginal zone B-cell lymphoma [facility]) I have reviewed multiple imaging studies with the patient and his wife Overall, he has no signs of disease I do not recommend surveillance imaging His next colonoscopy would be in 3 years, due in 2027 I will see him back in a year for further follow-up  Prostate cancer [facility]) The imaging study near the prostate area is somewhat obscured due to artifact from his prior hip prosthesis He will continue close monitoring with urologist with PSA  Chronic sinusitis I reviewed his sinus area with the patient To opacification on the right sinus has resolved  Controlled type 2 diabetes mellitus without complication, without long-term current use of insulin (HCC) We discussed importance of dietary modification and exercise The patient appears motivated to lose 10 pounds by his next visit  No orders of the defined types were placed in this encounter.   All questions were answered. The patient knows to call the clinic with any problems, questions or concerns. The total time spent in the appointment was 30 minutes encounter with patients including review of chart and various tests results, discussions about plan of care and coordination of care plan   Artis Delay, MD 11/05/2022 2:58 PM  INTERVAL HISTORY: Please see below for problem oriented charting. he returns for surveillance follow-up and review of PET/CT imaging results He has mild sinus congestion since surgery but overall improved  REVIEW OF SYSTEMS:   Constitutional: Denies fevers, chills or abnormal weight loss Eyes: Denies blurriness of vision Ears, nose, mouth, throat, and face: Denies mucositis or sore throat Respiratory: Denies cough, dyspnea or wheezes Cardiovascular: Denies palpitation,  chest discomfort or lower extremity swelling Gastrointestinal:  Denies nausea, heartburn or change in bowel habits Skin: Denies abnormal skin rashes Lymphatics: Denies new lymphadenopathy or easy bruising Neurological:Denies numbness, tingling or new weaknesses Behavioral/Psych: Mood is stable, no new changes  All other systems were reviewed with the patient and are negative.  I have reviewed the past medical history, past surgical history, social history and family history with the patient and they are unchanged from previous note.  ALLERGIES:  has No Known Allergies.  MEDICATIONS:  Current Outpatient Medications  Medication Sig Dispense Refill   acetaminophen (TYLENOL) 500 MG tablet Take 1,000 mg by mouth every 6 (six) hours as needed for moderate pain.     Alcohol Swabs (B-D SINGLE USE SWABS REGULAR) PADS Use to check blood sugar up to 2 times a day 100 each 5   aspirin EC 81 MG tablet Take 81 mg by mouth daily. Swallow whole.     atorvastatin (LIPITOR) 80 MG tablet Take 1 tablet (80 mg total) by mouth daily. 90 tablet 3   cetirizine (ZYRTEC) 10 MG tablet Take 10 mg by mouth daily.     Cholecalciferol (VITAMIN D) 50 MCG (2000 UT) tablet Take 2,000 Units by mouth daily.     CLINPRO 5000 1.1 % PSTE Place 1 Application onto teeth daily.     clobetasol ointment (TEMOVATE) 0.05 % Apply 1 Application topically 2 (two) times daily. (Patient taking differently: Apply 1 Application topically 2 (two) times daily as needed (itching).) 30 g 1   Cyanocobalamin (B-12) 5000 MCG CAPS Take 5,000 mcg by mouth daily.     Flaxseed, Linseed, (FLAX SEED OIL PO) Take 1 capsule by mouth daily.  FLUoxetine (PROZAC) 20 MG capsule TAKE 1 CAPSULE BY MOUTH EVERY DAY 30 capsule 5   fluticasone (FLONASE) 50 MCG/ACT nasal spray Place 2 sprays into both nostrils daily.     gabapentin (NEURONTIN) 300 MG capsule Take 3 capsules (900 mg total) by mouth at bedtime. 270 capsule 1   glucose blood test strip SMARTSIG:Via  Meter 1-2 Times Daily     Lancets (ONETOUCH DELICA PLUS LANCET30G) MISC USE TO CHECK BLOOD SUGAR UP TO 2 TIMES A DAY. 100 each 5   LORazepam (ATIVAN) 0.5 MG tablet Take 1 tablet (0.5 mg total) by mouth every 8 (eight) hours as needed. for anxiety 30 tablet 3   losartan (COZAAR) 25 MG tablet TAKE 1 TABLET (25 MG TOTAL) BY MOUTH DAILY. 90 tablet 3   metFORMIN (GLUCOPHAGE-XR) 500 MG 24 hr tablet Take 4 tablets (2,000 mg total) by mouth daily with breakfast. 120 tablet 5   methocarbamol (ROBAXIN) 500 MG tablet Take 1 tablet (500 mg total) by mouth every 6 (six) hours as needed for muscle spasms. 30 tablet 1   Multiple Vitamins-Minerals (MULTIVITAMIN WITH MINERALS) tablet Take 1 tablet by mouth daily.     Olopatadine HCl (PATADAY) 0.7 % SOLN Place 1 drop into both eyes daily.     ondansetron (ZOFRAN-ODT) 4 MG disintegrating tablet Take 4 mg by mouth every 8 (eight) hours as needed.     oxyCODONE-acetaminophen (PERCOCET/ROXICET) 5-325 MG tablet Take 1 tablet by mouth every 4 (four) hours as needed.     pantoprazole (PROTONIX) 40 MG tablet Take 40 mg by mouth daily.     Polyethyl Glycol-Propyl Glycol (SYSTANE OP) Place 1 drop into both eyes as needed (dry eyes).     sildenafil (VIAGRA) 100 MG tablet Take 0.5-1 tablets (50-100 mg total) by mouth daily as needed for erectile dysfunction. 5 tablet 11   No current facility-administered medications for this visit.    SUMMARY OF ONCOLOGIC HISTORY: Oncology History  Marginal zone B-cell lymphoma (HCC)  05/21/2020 Procedure   Colonoscopy by Dr. Rhea Belton - One 4 mm polyp in the ascending colon, removed with a cold snare. Resected and retrieved. - One 25 mm polyp in the proximal transverse colon, removed piecemeal using a cold snare. Resected and retrieved. Tattooed. - Diverticulosis from cecum to sigmoid colon. - The distal rectum and anal verge are normal on retroflexion view.   05/21/2020 Pathology Results   1. Surgical [P], colon, ascending, polyp (1) -  TUBULAR ADENOMA. - NO HIGH GRADE DYSPLASIA OR CARCINOMA. 2. Surgical [P], colon, transverse, polyp (1) - ATYPICAL LYMPHOID PROLIFERATION. - SEE MICROSCOPIC DESCRIPTION. Microscopic Comment 2. The sections show a dense, diffuse and relatively monomorphic lymphoid infiltrate involving the lamina propria and submucosa and mostly characterized by small to medium sized lymphocytes with round to slightly irregular nuclei and moderately abundant clear cytoplasm imparting a monocytoid appearance. The infiltrate displays scattered small foci of lymphoepithelial lesions and occasionally surround very small "naked" germinal centers. A battery of immunohistochemical stains was performed and shows that the lymphoid infiltrate is predominantly composed of B cells as seen with CD20 associated with diffuse positivity for Bcl-2 and CD43. No significant CD10 or cyclin D1 positivity is identified. BCL6 highlights scattering of small positive clusters correlating with previously described germinal centers. Ki-67 shows very low expression (less than 10%). In situ hybridization for kappa and lambda highlight a polyclonal superficial plasma cell component. The findings are highly suspicious for involvement by low-grade B-cell lymphoma particularly extranodal marginal zone lymphoma. FISH and gene rearrangement  study studies will be performed and the results reported in an addendum. Dr. Laureen Ochs reviewed this part and cocnurs. Jimmy Picket MD Pathologist, Electronic Signature (Case signed 05/27/2020)    06/24/2020 PET scan   No signs of nodal disease in the neck, chest, abdomen or pelvis.   Focal area of uptake in the LEFT hemi prostate, nonspecific but raising the question of prostate neoplasm. Urologic consultation is suggested for further evaluation.   Aortic atherosclerosis and coronary artery disease.   Maxillary sinus disease.   Aortic Atherosclerosis (ICD10-I70.0).   06/24/2020 Cancer Staging   Staging form: Hodgkin  and Non-Hodgkin Lymphoma, AJCC 8th Edition - Clinical stage from 06/24/2020: Stage IE (Marginal zone lymphoma) - Signed by Artis Delay, MD on 06/24/2020 Stage prefix: Initial diagnosis   09/30/2021 Procedure   - The examined portion of the ileum was normal. - One 1 mm polyp in the cecum, removed with a cold biopsy forceps. Resected and retrieved. - Post-polypectomy scar with distal tattoo in the proximal transverse colon. No evidence of residual polyp/tumor. Biopsied. - One 4 mm polyp in the transverse colon, removed with a cold snare. Complete resection. Polyp tissue not retrieved. - Moderate diverticulosis from ascending colon to sigmoid colon. - The distal rectum and anal verge are normal on retroflexion view.   09/30/2021 Pathology Results   Diagnosis 1. Surgical [P], colon, cecum, polyp (1) - POLYPOID FRAGMENT OF BENIGN COLONIC MUCOSA WITH NO SIGNIFICANT PATHOLOGIC CHANGES 2. Surgical [P], proximal transverse colon scar - ATYPICAL LYMPHOID INFILTRATE (SEE NOTE) Diagnosis Note 2. - Morphologic evaluation reveals colonic mucosa with a single prominent lymphoid aggregate, and no lymphoepithelial lesions. The cells are positive for CD20, CD43 and negative for CD3. Definitive clonality cannot be established given the lack of kappa and lambda ISH staining. Normal colonic mucosa is seen elsewhere. The findings are not diagnostic of, however in the given clinical context of the patient's history of extranodal MALT lymphoma, are suspicious for involvement. Dr. Kenyon Ana has reviewed the case and agrees with the interpretation. Clinical and radiologic correlation is recommended.   10/31/2021 PET scan   1. No hypermetabolic adenopathy above or below the diaphragm. 2. No splenomegaly or evidence of osseous lymphomatous involvement 3. Increased short segment uptake in the sigmoid colon along an area of diverticulosis with some associated wall thickening but no adjacent inflammation. While this may reflect  physiologic activity versus Segmental colitis associated with diverticulosis, an underlying colonic mass is a differential consideration not excluded on this study, recommend correlation with history of colon cancer screening and colonoscopy if clinically indicated. 4.  Aortic Atherosclerosis (ICD10-I70.0).   11/24/2021 - 12/15/2021 Chemotherapy   Patient is on Treatment Plan : NON-HODGKINS LYMPHOMA Rituximab q7d     03/16/2022 PET scan   1. No convincing evidence of metabolically active lymphomatous disease. 2. Mildly metabolic opacification of the left maxillary sinus and mucosal thickening of the right maxillary sinus, suggestive of sinusitis. 3. Similar right-sided predominant heterogeneous low-level FDG avidity in an enlarged prostate, component of which may reflect patient's known prostate carcinoma. 4. Stable tiny scattered bilateral pulmonary nodules stable over multiple prior examinations and non FDG avid suggestive of a benign etiology. 5. Diffuse colonic FDG avidity is commonly physiologic/medication-related (metformin). 6.  Aortic Atherosclerosis (ICD10-I70.0).   10/13/2022 Procedure   Findings:             The digital rectal exam was normal. A 5 mm polyp was found in the transverse colon. The polyp was sessile. The polyp was removed with  a cold snare. Resection and retrieval were complete. A tattoo was seen in the proximal transverse colon. A post-polypectomy scar was found at the tattoo site. There was no evidence of residual polyp tissue and no evidence of abnormal mucosa. Multiple medium-mouthed and small-mouthed diverticula were found in the sigmoid colon, descending colon and transverse colon. Internal hemorrhoids were found during  retroflexion. The hemorrhoids were small. Complications:            No immediate complications. Estimated Blood Loss:     Estimated blood loss: none. Impression:               - One 5 mm polyp in the transverse colon, removed                             with a cold snare. Resected and retrieved.                           - A tattoo was seen in the proximal transverse                            colon. A post-polypectomy scar was found at the                            tattoo site. There was no evidence of residual                            polyp tissue or abnormal mucosa.                           - Moderate diverticulosis in the sigmoid colon, in                            the descending colon and in the transverse colon.                           - Small internal hemorrhoids.   10/30/2022 PET scan   NM PET Image Restag (PS) Skull Base To Thigh  Result Date: 11/05/2022 CLINICAL DATA:  Subsequent treatment strategy for marginal zone B-cell lymphoma. EXAM: NUCLEAR MEDICINE PET SKULL BASE TO THIGH TECHNIQUE: 11.02 mCi F-18 FDG was injected intravenously. Full-ring PET imaging was performed from the skull base to thigh after the radiotracer. CT data was obtained and used for attenuation correction and anatomic localization. Fasting blood glucose: 122 mg/dl COMPARISON:  PET-CT 16/10/9602 and 10/29/2021 FINDINGS: Mediastinal blood pool activity: SUV max 2.5 Liver activity: SUV max 4.1 NECK: No hypermetabolic cervical lymph nodes are identified. No suspicious activity identified within the pharyngeal mucosal space. Incidental CT findings: Chronic paranasal sinus disease with diffuse left maxillary sinus mucosal thickening. Evidence of interval sinus surgery bilaterally. CHEST: There are no hypermetabolic mediastinal, hilar or axillary lymph nodes. No hypermetabolic pulmonary activity or suspicious nodularity. Incidental CT findings: Mild atherosclerosis of the aorta, great vessels and coronary arteries. Scattered tiny pulmonary nodules, including previously referenced 6 mm right middle lobe nodule on image 33/7, consistent with benign findings. ABDOMEN/PELVIS: There is no hypermetabolic activity within the liver, adrenal glands, spleen or pancreas. There is  no hypermetabolic nodal activity in the abdomen or pelvis.  No appreciable abnormal activity within the prostate gland. Incidental CT findings: Stable small hepatic cysts, aortic and branch vessel atherosclerosis and distal colonic diverticulosis. SKELETON: There is no hypermetabolic activity to suggest osseous metastatic disease. Incidental CT findings: Bilateral total hip arthroplasty. Mild lumbar spondylosis. IMPRESSION: 1. No evidence of recurrent lymphoma or metastatic disease. 2. Stable incidental CT findings as described. 3.  Aortic Atherosclerosis (ICD10-I70.0). Electronically Signed   By: Carey Bullocks M.D.   On: 11/05/2022 09:11      Prostate cancer (HCC)  04/02/2021 Initial Diagnosis   Prostate cancer (HCC)   06/24/2021 Cancer Staging   Staging form: Prostate, AJCC 8th Edition - Clinical stage from 06/24/2021: cT1, cN0, cM0, Grade Group: 1 - Signed by Artis Delay, MD on 06/24/2021 Stage prefix: Initial diagnosis Histologic grading system: 5 grade system     PHYSICAL EXAMINATION: ECOG PERFORMANCE STATUS: 0 - Asymptomatic  Vitals:   11/05/22 1357  BP: 115/68  Pulse: 61  Resp: 18  Temp: (!) 97.5 F (36.4 C)  SpO2: 97%   Filed Weights   11/05/22 1357  Weight: 220 lb (99.8 kg)    GENERAL:alert, no distress and comfortable  LABORATORY DATA:  I have reviewed the data as listed    Component Value Date/Time   NA 139 10/19/2022 1222   NA 139 01/11/2020 1249   K 4.3 10/19/2022 1222   CL 107 10/19/2022 1222   CO2 26 10/19/2022 1222   GLUCOSE 104 (H) 10/19/2022 1222   BUN 17 10/19/2022 1222   BUN 14 01/11/2020 1249   CREATININE 0.90 10/19/2022 1222   CREATININE 1.17 11/21/2021 1422   CALCIUM 9.1 10/19/2022 1222   PROT 6.9 10/19/2022 1222   ALBUMIN 4.1 10/19/2022 1222   AST 29 10/19/2022 1222   AST 24 11/21/2021 1422   ALT 37 10/19/2022 1222   ALT 34 11/21/2021 1422   ALKPHOS 120 10/19/2022 1222   BILITOT 0.7 10/19/2022 1222   BILITOT 0.8 11/21/2021 1422   GFRNONAA  >60 10/19/2022 1222   GFRNONAA >60 11/21/2021 1422   GFRAA 91 01/11/2020 1249    No results found for: "SPEP", "UPEP"  Lab Results  Component Value Date   WBC 5.6 10/19/2022   NEUTROABS 3.5 10/19/2022   HGB 13.3 10/19/2022   HCT 40.3 10/19/2022   MCV 91.6 10/19/2022   PLT 202 10/19/2022      Chemistry      Component Value Date/Time   NA 139 10/19/2022 1222   NA 139 01/11/2020 1249   K 4.3 10/19/2022 1222   CL 107 10/19/2022 1222   CO2 26 10/19/2022 1222   BUN 17 10/19/2022 1222   BUN 14 01/11/2020 1249   CREATININE 0.90 10/19/2022 1222   CREATININE 1.17 11/21/2021 1422      Component Value Date/Time   CALCIUM 9.1 10/19/2022 1222   ALKPHOS 120 10/19/2022 1222   AST 29 10/19/2022 1222   AST 24 11/21/2021 1422   ALT 37 10/19/2022 1222   ALT 34 11/21/2021 1422   BILITOT 0.7 10/19/2022 1222   BILITOT 0.8 11/21/2021 1422       RADIOGRAPHIC STUDIES: I have reviewed imaging study with the patient I have personally reviewed the radiological images as listed and agreed with the findings in the report. NM PET Image Restag (PS) Skull Base To Thigh  Result Date: 11/05/2022 CLINICAL DATA:  Subsequent treatment strategy for marginal zone B-cell lymphoma. EXAM: NUCLEAR MEDICINE PET SKULL BASE TO THIGH TECHNIQUE: 11.02 mCi F-18 FDG was injected intravenously. Full-ring  PET imaging was performed from the skull base to thigh after the radiotracer. CT data was obtained and used for attenuation correction and anatomic localization. Fasting blood glucose: 122 mg/dl COMPARISON:  PET-CT 29/56/2130 and 10/29/2021 FINDINGS: Mediastinal blood pool activity: SUV max 2.5 Liver activity: SUV max 4.1 NECK: No hypermetabolic cervical lymph nodes are identified. No suspicious activity identified within the pharyngeal mucosal space. Incidental CT findings: Chronic paranasal sinus disease with diffuse left maxillary sinus mucosal thickening. Evidence of interval sinus surgery bilaterally. CHEST: There  are no hypermetabolic mediastinal, hilar or axillary lymph nodes. No hypermetabolic pulmonary activity or suspicious nodularity. Incidental CT findings: Mild atherosclerosis of the aorta, great vessels and coronary arteries. Scattered tiny pulmonary nodules, including previously referenced 6 mm right middle lobe nodule on image 33/7, consistent with benign findings. ABDOMEN/PELVIS: There is no hypermetabolic activity within the liver, adrenal glands, spleen or pancreas. There is no hypermetabolic nodal activity in the abdomen or pelvis. No appreciable abnormal activity within the prostate gland. Incidental CT findings: Stable small hepatic cysts, aortic and branch vessel atherosclerosis and distal colonic diverticulosis. SKELETON: There is no hypermetabolic activity to suggest osseous metastatic disease. Incidental CT findings: Bilateral total hip arthroplasty. Mild lumbar spondylosis. IMPRESSION: 1. No evidence of recurrent lymphoma or metastatic disease. 2. Stable incidental CT findings as described. 3.  Aortic Atherosclerosis (ICD10-I70.0). Electronically Signed   By: Carey Bullocks M.D.   On: 11/05/2022 09:11

## 2022-11-05 NOTE — Assessment & Plan Note (Signed)
The imaging study near the prostate area is somewhat obscured due to artifact from his prior hip prosthesis He will continue close monitoring with urologist with PSA

## 2022-11-05 NOTE — Assessment & Plan Note (Signed)
I reviewed his sinus area with the patient To opacification on the right sinus has resolved

## 2022-11-05 NOTE — Assessment & Plan Note (Signed)
I have reviewed multiple imaging studies with the patient and his wife Overall, he has no signs of disease I do not recommend surveillance imaging His next colonoscopy would be in 3 years, due in 2027 I will see him back in a year for further follow-up

## 2022-11-05 NOTE — Assessment & Plan Note (Signed)
We discussed importance of dietary modification and exercise The patient appears motivated to lose 10 pounds by his next visit

## 2022-11-06 ENCOUNTER — Other Ambulatory Visit: Payer: Self-pay | Admitting: Cardiology

## 2022-11-09 ENCOUNTER — Encounter: Payer: Self-pay | Admitting: Family Medicine

## 2022-11-21 ENCOUNTER — Other Ambulatory Visit: Payer: Self-pay | Admitting: Family Medicine

## 2022-11-23 ENCOUNTER — Ambulatory Visit: Payer: Medicare HMO | Admitting: Cardiology

## 2022-11-23 DIAGNOSIS — E119 Type 2 diabetes mellitus without complications: Secondary | ICD-10-CM | POA: Diagnosis not present

## 2022-11-23 DIAGNOSIS — H524 Presbyopia: Secondary | ICD-10-CM | POA: Diagnosis not present

## 2022-11-23 DIAGNOSIS — Z0100 Encounter for examination of eyes and vision without abnormal findings: Secondary | ICD-10-CM | POA: Diagnosis not present

## 2022-11-23 LAB — OPHTHALMOLOGY REPORT-SCANNED

## 2022-11-24 ENCOUNTER — Ambulatory Visit: Payer: Medicare HMO | Admitting: General Practice

## 2022-11-24 ENCOUNTER — Other Ambulatory Visit: Payer: Self-pay | Admitting: Family Medicine

## 2022-11-24 ENCOUNTER — Ambulatory Visit: Payer: Medicare HMO | Admitting: Neurology

## 2022-11-27 ENCOUNTER — Encounter: Payer: Self-pay | Admitting: Family Medicine

## 2022-11-27 MED ORDER — FLUOXETINE HCL 20 MG PO CAPS: 20.0000 mg | ORAL_CAPSULE | Freq: Every day | ORAL | 1 refills | Status: DC

## 2022-12-01 ENCOUNTER — Encounter: Payer: Self-pay | Admitting: Neurology

## 2022-12-01 ENCOUNTER — Ambulatory Visit: Payer: Medicare HMO | Admitting: Neurology

## 2022-12-01 VITALS — BP 121/74 | HR 63 | Ht 70.0 in | Wt 219.0 lb

## 2022-12-01 DIAGNOSIS — E1142 Type 2 diabetes mellitus with diabetic polyneuropathy: Secondary | ICD-10-CM | POA: Diagnosis not present

## 2022-12-01 MED ORDER — GABAPENTIN 300 MG PO CAPS: ORAL_CAPSULE | ORAL | 1 refills | Status: DC

## 2022-12-01 NOTE — Progress Notes (Signed)
Follow-up Visit   Date: 12/01/2022    Charles [name] MRN: [id number] DOB: 05-Sep-1955    Charles [name] is a 67 y.o. right-handed Caucasian male with diet controlled diabetes mellitus, anxiety, GERD, prostate cancer, marginal zone B cell lymphoma, and history of alcohol use  returning to the clinic for follow-up of neuropathy.  The patient was accompanied to the clinic by wife (retired Engineer, civil (consulting)) who also provides collateral information.    IMPRESSION/PLAN: Peripheral neuropathy manifesting with painful paresthesias of the feet.  Pain remains suboptimally controlled. Risk factors:  diabetes, history of alcohol abuse.  Labs for secondary causes of neuropathy were reviewed and normal.   - Adjust gabapentin to 600mg  at 4pm and 600mg  at 9pm  Return to clinic in 6 months  --------------------------------------------- History of present illness: Starting about 12 years ago, he began having burning, stinging, and tingling in the feet and distal weakness. Symptoms are constant and worse in the evening when he tries to rest.  Nothing has worsened symptoms.  He has tried a foot massage which provides some relief.  He has noticed that his toes are harder to move, because they feel stuck.  Balance is good, although he had one fall today in the shower while trying to practice his golf swing.  He did not hurt himself.     He had diabetes for the past 3 years, which is diet controlled.  For about 25 years, until the mid 2000s, he was drinking excessively, sometimes 6 pack of beer nightly.  He has lost his drivers license on several occasions because of alcohol abuse. He is currently sober. No exposure to chemotherapy  UPDATE 03/16/2022:  He is here for follow-up visit.  He was started on gabapentin 300mg  at the last visit, however he has not noticed any improvement in the burning pain in the feet.  Pain is always worse in the evening.  He denies side effects. No new weakness or imbalance.   He had PET  scan today to assess response to chemotherapy for B cell lymphoma. He is understandably nervous about this.    UPDATE 07/21/2022:  He is here for follow-up visit.  He reports that his feel continue to have burning pain, which is worse when at rest or in the evening.  Unfortunately, he did not appreciate any change when he increased the dose of gabapentin to 600mg  at bedtime.  He underwent left hip replacement, which has helped with walking. He will be having upcoming sinus surgery.  No new complaints.    UPDATE 12/01/2022:  He is here for follow-up visit.  He has noticed improvement in pain since increasing gabapentin to 900mg  at bedtime.  Pain is still bothersome about 2-3 nights per week, especially around 6pm.  No new complaints.   Medications:  Current Outpatient Medications on File Prior to Visit  Medication Sig Dispense Refill   acetaminophen (TYLENOL) 500 MG tablet Take 1,000 mg by mouth every 6 (six) hours as needed for moderate pain.     Alcohol Swabs (B-D SINGLE USE SWABS REGULAR) PADS Use to check blood sugar up to 2 times a day 100 each 5   aspirin EC 81 MG tablet Take 81 mg by mouth daily. Swallow whole.     atorvastatin (LIPITOR) 80 MG tablet TAKE 1 TABLET BY MOUTH EVERY DAY 90 tablet 3   cetirizine (ZYRTEC) 10 MG tablet Take 10 mg by mouth daily.     Cholecalciferol (VITAMIN D) 50 MCG (2000 UT) tablet  Take 2,000 Units by mouth daily.     CLINPRO 5000 1.1 % PSTE Place 1 Application onto teeth daily.     clobetasol ointment (TEMOVATE) 0.05 % Apply 1 Application topically 2 (two) times daily. (Patient taking differently: Apply 1 Application topically 2 (two) times daily as needed (itching).) 30 g 1   Cyanocobalamin (B-12) 5000 MCG CAPS Take 5,000 mcg by mouth daily.     Flaxseed, Linseed, (FLAX SEED OIL PO) Take 1 capsule by mouth daily.     FLUoxetine (PROZAC) 20 MG capsule Take 1 capsule (20 mg total) by mouth daily. 90 capsule 1   fluticasone (FLONASE) 50 MCG/ACT nasal spray Place 2  sprays into both nostrils daily.     gabapentin (NEURONTIN) 300 MG capsule Take 3 capsules (900 mg total) by mouth at bedtime. 270 capsule 1   glucose blood test strip SMARTSIG:Via Meter 1-2 Times Daily     Lancets (ONETOUCH DELICA PLUS LANCET30G) MISC USE TO CHECK BLOOD SUGAR UP TO 2 TIMES A DAY. 100 each 5   LORazepam (ATIVAN) 0.5 MG tablet Take 1 tablet (0.5 mg total) by mouth every 8 (eight) hours as needed. for anxiety 30 tablet 3   losartan (COZAAR) 25 MG tablet TAKE 1 TABLET (25 MG TOTAL) BY MOUTH DAILY. 90 tablet 3   metFORMIN (GLUCOPHAGE-XR) 500 MG 24 hr tablet TAKE 4 TABLETS BY MOUTH DAILY WITH BREAKFAST 360 tablet 1   Multiple Vitamins-Minerals (MULTIVITAMIN WITH MINERALS) tablet Take 1 tablet by mouth daily.     Olopatadine HCl (PATADAY) 0.7 % SOLN Place 1 drop into both eyes daily.     pantoprazole (PROTONIX) 40 MG tablet Take 40 mg by mouth daily.     Polyethyl Glycol-Propyl Glycol (SYSTANE OP) Place 1 drop into both eyes as needed (dry eyes).     sildenafil (VIAGRA) 100 MG tablet Take 0.5-1 tablets (50-100 mg total) by mouth daily as needed for erectile dysfunction. 5 tablet 11   No current facility-administered medications on file prior to visit.    Allergies: No Known Allergies  Vital Signs:  BP 121/74   Pulse 63   Ht 5\' 10"  (1.778 m)   Wt 219 lb (99.3 kg)   SpO2 98%   BMI 31.42 kg/m    Neurological Exam: MENTAL STATUS including orientation to time, place, person, recent and remote memory, attention span and concentration, language, and fund of knowledge is normal.  Speech is not dysarthric.  CRANIAL NERVES: Pupils equal round and reactive to light.  Normal conjugate, extra-ocular eye movements in all directions of gaze.  No ptosis .  Face is symmetric.   MOTOR:  Motor strength is 5/5 in all extremities, .  No atrophy, fasciculations or abnormal movements.  No pronator drift.  Tone is normal.    MSRs:  Reflexes are 2+/4 throughout.  SENSORY:  Intact to vibration  at the ankles and great toe.  COORDINATION/GAIT:  Normal finger-to- nose-finger.  Gait narrow based and stable.   Data:  Lab Results  Component Value Date   VITAMINB12 354 11/14/2021   Lab Results  Component Value Date   HGBA1C 6.6 (A) 10/12/2022     Thank you for allowing me to participate in patient's care.  If I can answer any additional questions, I would be pleased to do so.    Sincerely,    Deloss Amico K. Allena Katz, DO

## 2022-12-01 NOTE — Patient Instructions (Signed)
Start taking gabapentin 600mg  at 4p and 9p.

## 2022-12-14 DIAGNOSIS — H532 Diplopia: Secondary | ICD-10-CM | POA: Diagnosis not present

## 2022-12-18 ENCOUNTER — Other Ambulatory Visit: Payer: Self-pay | Admitting: Neurology

## 2022-12-21 NOTE — Progress Notes (Unsigned)
Cardiology Clinic Note   Patient Name: Charles [name] Date of Encounter: 12/24/2022  Primary Care Provider:  Hannah Beat, MD Primary Cardiologist:  Little Ishikawa, MD  Patient Profile    Charles [name] 67 year old male presents the clinic today for follow-up evaluation of his essential hypertension.  Past Medical History    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   Neuromuscular disorder (HCC)    neuropathy feet   Prostate cancer (HCC) 04/02/2021   Tubular adenoma of colon    Ulcer 2004   Past Surgical History:  Procedure Laterality Date   ACHILLES TENDON REPAIR  2001   BICEPS TENDON REPAIR Left 2014   COLON SURGERY  2022   Polyps/ lymphoma tumor removed   COLONOSCOPY  2012   polyps-[place]   COSMETIC SURGERY  1995   Eye lids   HEMORROIDECTOMY  2012   NASAL SINUS SURGERY     09/14/22   ROTATOR CUFF REPAIR Left 2014   TOTAL HIP ARTHROPLASTY Right 12/07/2014   Procedure: RIGHT TOTAL HIP ARTHROPLASTY ANTERIOR APPROACH;  Surgeon: Kathryne Hitch, MD;  Location: [facility];  Service: Orthopedics;  Laterality: Right;   TOTAL HIP ARTHROPLASTY Left 05/29/2022   Procedure: LEFT TOTAL HIP ARTHROPLASTY ANTERIOR APPROACH;  Surgeon: Kathryne Hitch, MD;  Location: [facility];  Service: Orthopedics;  Laterality: Left;    Allergies  No Known Allergies  History of Present Illness    Charles [name] has a PMH of coronary artery disease, unequal blood pressure in upper extremities (unremarkable upper extremity ultrasound), HTN, and HLD.  Coronary calcium score 155 on 10/03/2019.  Coronary CTA 01/18/2020 showed a coronary calcium score of 165,  nonobstructive CAD with mixed plaque in his mid LAD causing 24-49% stenosis and plaque in his proximal LAD with 0-24% stenosis, noncalcified plaque in the proximal RCA 25-49% stenosis and mid RCA plaque 0-24% stenosis.  Echocardiogram 11/03/2019 showed normal biventricular function and no significant valvular disease.  He was initially seen and evaluated by Dr. Bjorn Pippin 10/27/2019.  He reported having intermittent episodes of chest pain that would occur a few times per month.  He described the pain as sharp, in the center of his chest, and would last up to 1 minute.  His symptoms could occur at rest or with exertion.  He also reported shortness of breath with exertion.  He was having lightheadedness when he would stand.  He denied palpitations, lower extremity swelling, and syncope.  He was golfing 2-3 times per week for exercise and was walking the course.  He smoked for 20 years and quit in 2002.  He has a father who had an MI at age 1 and CVAs in his 31s.  He was seen in follow-up by Dr. Bjorn Pippin on 05/27/2022.  During that time he reported he was doing well.  He denied chest pain, dyspnea, lower extremity swelling, and palpitations.  He had been golfing and walking the course 6 weeks prior.  He developed hip pain and over the prior 6 weeks he was limited in his physical activity.  He was able to walk up a flight of stairs without stopping.  He denied exertional chest discomfort.  He continued to note some lightheadedness but denied syncope.  He presents to the clinic today for follow-up evaluation and states he continues to be very physically active golfing 2 days/week and walking 4 days/week.  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  We reviewed his coronary CTA and upper extremity ultrasound.  He expressed understanding.  He tolerated his left hip surgery well and also had sinus surgery in September.  He feels he is breathing better.  He has been monitoring his diet more  closely.  I will have him maintain his physical activity, give the salty 6 diet sheet and plan follow-up in 9 to 12 months..  Today he denies chest pain, shortness of breath, lower extremity edema,palpitations, melena, hematuria, hemoptysis,  presyncope, syncope, orthopnea, and PND.   Home Medications    Prior to Admission medications   Medication Sig Start Date End Date Taking? Authorizing Provider  acetaminophen (TYLENOL) 500 MG tablet Take 1,000 mg by mouth every 6 (six) hours as needed for moderate pain.    [provider]  Alcohol Swabs (B-D SINGLE USE SWABS REGULAR) PADS Use to check blood sugar up to 2 times a day 04/07/21   Copland, Karleen Hampshire, MD  aspirin EC 81 MG tablet Take 81 mg by mouth daily. Swallow whole.    [provider]  atorvastatin (LIPITOR) 80 MG tablet TAKE 1 TABLET BY MOUTH EVERY DAY 11/06/22   Little Ishikawa, MD  cetirizine (ZYRTEC) 10 MG tablet Take 10 mg by mouth daily. 01/05/17   [provider]  Cholecalciferol (VITAMIN D) 50 MCG (2000 UT) tablet Take 2,000 Units by mouth daily.    [provider]  CLINPRO 5000 1.1 % PSTE Place 1 Application onto teeth daily. 09/22/20   [provider]  clobetasol ointment (TEMOVATE) 0.05 % Apply 1 Application topically 2 (two) times daily. Patient taking differently: Apply 1 Application topically 2 (two) times daily as needed (itching). 04/09/22   Copland, Karleen Hampshire, MD  Cyanocobalamin (B-12) 5000 MCG CAPS Take 5,000 mcg by mouth daily.    [provider]  Flaxseed, Linseed, (FLAX SEED OIL PO) Take 1 capsule by mouth daily.    [provider]  FLUoxetine (PROZAC) 20 MG capsule Take 1 capsule (20 mg total) by mouth daily. 11/27/22   Copland, Karleen Hampshire, MD  fluticasone (FLONASE) 50 MCG/ACT nasal spray Place 2 sprays into both nostrils daily.    [provider]  gabapentin (NEURONTIN) 300 MG capsule Take 2 tablet at 4pm and 9pm. 12/01/22   Nita Sickle K, DO  glucose  blood test strip SMARTSIG:Via Meter 1-2 Times Daily 08/27/22   [provider]  Lancets (ONETOUCH DELICA PLUS LANCET30G) MISC USE TO CHECK BLOOD SUGAR UP TO 2 TIMES A DAY. 05/12/22   Copland, Karleen Hampshire, MD  LORazepam (ATIVAN) 0.5 MG tablet Take 1 tablet (0.5 mg total) by mouth every 8 (eight) hours as needed. for anxiety 10/12/22   Copland, Karleen Hampshire, MD  losartan (COZAAR) 25 MG tablet TAKE 1 TABLET (25 MG TOTAL) BY MOUTH DAILY. 04/15/22   Bedsole, Amy E, MD  metFORMIN (GLUCOPHAGE-XR) 500 MG 24 hr tablet TAKE 4 TABLETS BY MOUTH DAILY WITH BREAKFAST 11/23/22   Copland, Karleen Hampshire, MD  Multiple Vitamins-Minerals (MULTIVITAMIN WITH MINERALS) tablet Take 1 tablet by mouth daily.    [provider]  Olopatadine HCl (PATADAY) 0.7 % SOLN Place 1 drop into both eyes daily.    [provider]  pantoprazole (PROTONIX) 40 MG tablet Take  40 mg by mouth daily. 05/30/22   [provider]  Polyethyl Glycol-Propyl Glycol (SYSTANE OP) Place 1 drop into both eyes as needed (dry eyes).    [provider]  sildenafil (VIAGRA) 100 MG tablet Take 0.5-1 tablets (50-100 mg total) by mouth daily as needed for erectile dysfunction. 10/12/22   Copland, Karleen Hampshire, MD    Family History    Family History  Problem Relation Age of Onset   Alcohol abuse Father    Heart attack Father    Hyperlipidemia Father    Stroke Father    Hypertension Father    Hearing loss Father    Heart disease Father    Breast cancer Mother 48   Hypertension Mother    Cancer Mother    Diabetes Mother    Obesity Mother    Stroke Mother    ADD / ADHD Brother    Alcohol abuse Brother    Anxiety disorder Brother    Hyperlipidemia Brother    Obesity Brother    Alcohol abuse Brother    Asthma Sister    Hyperlipidemia Sister    Obesity Sister    Colon cancer Neg Hx    Colon polyps Neg Hx    Esophageal cancer Neg Hx    Rectal cancer Neg Hx    Stomach cancer Neg Hx    He indicated that his mother is alive. He  indicated that his father is alive. He indicated that the status of his sister is unknown. He indicated that his maternal grandmother is deceased. He indicated that his maternal grandfather is deceased. He indicated that his paternal grandmother is deceased. He indicated that his paternal grandfather is deceased. He indicated that the status of his neg hx is unknown.  Social History    Social History   Socioeconomic History   Marital status: Married    Spouse name: Andrey Campanile   Number of children: 1   Years of education: Not on file   Highest education level: Bachelor's degree (e.g., BA, AB, BS)  Occupational History   Occupation: insurance adj retired  Tobacco Use   Smoking status: Former    Current packs/day: 0.00    Average packs/day: 1 pack/day for 20.0 years (20.0 ttl pk-yrs)    Types: Cigarettes    Start date: 01/06/1983    Quit date: 01/06/2003    Years since quitting: 19.9    Passive exposure: Past   Smokeless tobacco: Former    Types: Chew    Quit date: 01/06/2004   Tobacco comments:    Also used chewing tobacco formerly  Vaping Use   Vaping status: Never Used  Substance and Sexual Activity   Alcohol use: Not Currently    Comment: Totally quit 2005   Drug use: No   Sexual activity: Yes    Partners: Female    Birth control/protection: None    Comment: Wife had tubal ligation  Other Topics Concern   Not on file  Social History Narrative   Married, Web designer baseball player   Avid golfer      Are you right handed or left handed? Right Handed    Are you currently employed ? 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 No   Who lives with you? With Wife    What type of home do you live in: 1 story or 2 story? Two story home  Wife is a retired Engineer, civil (consulting)        Social Drivers of Corporate investment banker Strain: Low Risk  (03/25/2022)   Overall Financial Resource Strain (CARDIA)    Difficulty  of Paying Living Expenses: Not hard at all  Food Insecurity: No Food Insecurity (05/29/2022)   Hunger Vital Sign    Worried About Running Out of Food in the Last Year: Never true    Ran Out of Food in the Last Year: Never true  Transportation Needs: No Transportation Needs (05/29/2022)   PRAPARE - Administrator, Civil Service (Medical): No    Lack of Transportation (Non-Medical): No  Physical Activity: Sufficiently Active (03/25/2022)   Exercise Vital Sign    Days of Exercise per Week: 5 days    Minutes of Exercise per Session: 150+ min  Stress: No Stress Concern Present (03/25/2022)   [employer] of Occupational Health - Occupational Stress Questionnaire    Feeling of Stress : Not at all  Social Connections: Socially Integrated (03/25/2022)   Social Connection and Isolation Panel [NHANES]    Frequency of Communication with Friends and Family: More than three times a week    Frequency of Social Gatherings with Friends and Family: More than three times a week    Attends Religious Services: More than 4 times per year    Active Member of [employer] or Organizations: Yes    Attends Engineer, structural: More than 4 times per year    Marital Status: Married  Catering manager Violence: Not At Risk (05/29/2022)   Humiliation, Afraid, Rape, and Kick questionnaire    Fear of Current or Ex-Partner: No    Emotionally Abused: No    Physically Abused: No    Sexually Abused: No     Review of Systems    General:  No chills, fever, night sweats or weight changes.  Cardiovascular:  No chest pain, dyspnea on exertion, edema, orthopnea, palpitations, paroxysmal nocturnal dyspnea. Dermatological: No rash, lesions/masses Respiratory: No cough, dyspnea Urologic: No hematuria, dysuria Abdominal:   No nausea, vomiting, diarrhea, bright red blood per rectum, melena, or hematemesis Neurologic:  No visual changes, wkns, changes in mental status. All other systems reviewed and are  otherwise negative except as noted above.  Physical Exam    VS:  BP 132/76 (BP Location: Left Arm, Patient Position: Sitting, Cuff Size: Large)   Pulse 75   Ht 5\' 10"  (1.778 m)   Wt 225 lb 6.4 oz (102.2 kg)   SpO2 94%   BMI 32.34 kg/m  , BMI Body mass index is 32.34 kg/m. GEN: Well nourished, well developed, in no acute distress. HEENT: normal. Neck: Supple, no JVD, carotid bruits, or masses. Cardiac: RRR, no murmurs, rubs, or gallops. No clubbing, cyanosis, edema.  Radials/DP/PT 2+ and equal bilaterally.  Respiratory:  Respirations regular and unlabored, clear to auscultation bilaterally. GI: Soft, nontender, nondistended, BS + x 4. MS: no deformity or atrophy. Skin: warm and dry, no rash. Neuro:  Strength and sensation are intact. Psych: Normal affect.  Accessory Clinical Findings    Recent Labs: 10/19/2022: ALT 37; BUN 17; Creatinine, Ser 0.90; Hemoglobin 13.3; Platelets 202; Potassium 4.3; Sodium 139   Recent Lipid Panel    Component Value Date/Time   CHOL 117 04/01/2022 0750   CHOL 142 02/01/2020 0929   TRIG 73.0 04/01/2022 0750   HDL 42.70 04/01/2022 0750   HDL 41 02/01/2020 0929   CHOLHDL 3 04/01/2022 0750   VLDL 14.6 04/01/2022  0750   LDLCALC 60 04/01/2022 0750   LDLCALC 69 02/01/2020 0929   LDLDIRECT 141.0 10/21/2016 0833         ECG personally reviewed by me today-none today.    Echocardiogram 11/03/2019  IMPRESSIONS     1. Left ventricular ejection fraction, by estimation, is 70 to 75%. The  left ventricle has hyperdynamic function. The left ventricle has no  regional wall motion abnormalities. Left ventricular diastolic parameters  were normal.   2. Right ventricular systolic function is normal. The right ventricular  size is normal. There is normal pulmonary artery systolic pressure.   3. The mitral valve is normal in structure. Trivial mitral valve  regurgitation. No evidence of mitral stenosis.   4. The aortic valve is tricuspid. Aortic  valve regurgitation is not  visualized. No aortic stenosis is present.   5. The inferior vena cava is normal in size with greater than 50%  respiratory variability, suggesting right atrial pressure of 3 mmHg.   FINDINGS   Left Ventricle: Left ventricular ejection fraction, by estimation, is 70  to 75%. The left ventricle has hyperdynamic function. The left ventricle  has no regional wall motion abnormalities. The left ventricular internal  cavity size was normal in size.  There is no left ventricular hypertrophy. Left ventricular diastolic  parameters were normal.   Right Ventricle: The right ventricular size is normal. Right ventricular  systolic function is normal. There is normal pulmonary artery systolic  pressure. The tricuspid regurgitant velocity is 2.43 m/s, and with an  assumed right atrial pressure of 3 mmHg,   the estimated right ventricular systolic pressure is 26.6 mmHg.   Left Atrium: Left atrial size was normal in size.   Right Atrium: Right atrial size was normal in size.   Pericardium: There is no evidence of pericardial effusion.   Mitral Valve: The mitral valve is normal in structure. Trivial mitral  valve regurgitation. No evidence of mitral valve stenosis.   Tricuspid Valve: The tricuspid valve is normal in structure. Tricuspid  valve regurgitation is trivial. No evidence of tricuspid stenosis.   Aortic Valve: The aortic valve is tricuspid. Aortic valve regurgitation is  not visualized. No aortic stenosis is present.   Pulmonic Valve: The pulmonic valve was normal in structure. Pulmonic valve  regurgitation is not visualized. No evidence of pulmonic stenosis.   Aorta: The aortic root is normal in size and structure.   Venous: The inferior vena cava is normal in size with greater than 50%  respiratory variability, suggesting right atrial pressure of 3 mmHg.   IAS/Shunts: No atrial level shunt detected by color flow Doppler.   Coronary CTA  01/18/2020  FINDINGS: A 100 kV prospective scan was triggered in the descending thoracic aorta at 111 HU's. Axial non-contrast 3 mm slices were carried out through the heart. The data set was analyzed on a dedicated work station and scored using the Agatson method. Gantry rotation speed was 250 msecs and collimation was .6 mm. 0.8 mg of sl NTG was given. The 3D data set was reconstructed in 5% intervals of the 35-75 % of the R-R cycle. Phases were analyzed on a dedicated work station using MPR, MIP and VRT modes. The patient received 80 cc of contrast.   Coronary Arteries:  Normal coronary origin.  Right dominance.   RCA is a large dominant artery that gives rise to PDA and PLA. There is noncalcified plaque in the proximal RCA causing 25-49% stenosis. Calcified plaque in the proximal and  mid RCA causes 0-24% stenosis   Left main is a large artery that gives rise to LAD and LCX arteries.   LAD is a large vessel. There is calcified plaque in the proximal LAD causing 0-24% stenosis. There is mixed plaque in the mid LAD causing 25-49% stenosis   LCX is a non-dominant artery that gives rise to one large OM1 branch. There is no plaque.   Other findings:   Left Ventricle: Normal size   Left Atrium: Moderate enlargement   Pulmonary Veins: Normal configuration   Right Ventricle: Normal size   Right Atrium: Mild enlargement   Cardiac valves: No calcifications   Thoracic aorta: Normal size   Pulmonary Arteries: Normal size   Systemic Veins: Normal drainage   Pericardium: Normal thickness   IMPRESSION: 1. Coronary calcium score of 165. This was 62nd percentile for age and sex matched control.   2. Normal coronary origin with right dominance.   3. Nonobstructive CAD   4. Mixed plaque in the mid LAD causes mild (24-49%) stenosis. Calcified plaque in the proximal LAD causes minimal (0-24%) stenosis   5. Noncalcified plaque in the proximal RCA causes mild  (25-49%) stenosis. Calcified plaque in the proximal and mid RCA causes minimal (0-24%) stenosis   CAD-RADS 2. Mild non-obstructive CAD (25-49%). Consider non-atherosclerotic causes of chest pain. Consider preventive therapy and risk factor modification.     Electronically Signed   By: Epifanio Lesches MD   On: 01/18/2020 21:37      Assessment & Plan   1.  Coronary artery disease-no chest pain today.  Coronary CTA 01/18/2020 showed nonobstructive CAD.  Details above.  Reviewed coronary CTA Maintain physical activity Continue current medical therapy Heart healthy low-sodium diet  Essential hypertension-BP today 132/76 Maintain blood pressure log Heart healthy low-sodium diet Continue losartan  Hyperlipidemia-LDL 60 on 04/01/22. High-fiber diet Continue atorvastatin, aspirin Increase physical activity as tolerated  Unequal blood pressure in upper extremities-upper extremity vascular arterial duplex showed no obstruction in upper extremities.  There was no evidence of stenosis.  Disposition: Follow-up with Dr. Bjorn Pippin or me in 9-12 months.   Thomasene Ripple. Ruby Logiudice NP-C     12/24/2022, 8:22 AM [facility] [address] Office [phone] Fax [phone]    I spent 14 minutes examining this patient, reviewing medications, and using patient centered shared decision making involving her cardiac care.   I spent greater than 20 minutes reviewing her past medical history,  medications, and prior cardiac tests.

## 2022-12-23 DIAGNOSIS — H532 Diplopia: Secondary | ICD-10-CM | POA: Diagnosis not present

## 2022-12-23 DIAGNOSIS — J320 Chronic maxillary sinusitis: Secondary | ICD-10-CM | POA: Diagnosis not present

## 2022-12-24 ENCOUNTER — Ambulatory Visit: Payer: Medicare HMO | Attending: General Practice | Admitting: General Practice

## 2022-12-24 ENCOUNTER — Encounter: Payer: Self-pay | Admitting: General Practice

## 2022-12-24 VITALS — BP 132/76 | HR 75 | Ht 70.0 in | Wt 225.4 lb

## 2022-12-24 DIAGNOSIS — I2510 Atherosclerotic heart disease of native coronary artery without angina pectoris: Secondary | ICD-10-CM | POA: Diagnosis not present

## 2022-12-24 DIAGNOSIS — R0989 Other specified symptoms and signs involving the circulatory and respiratory systems: Secondary | ICD-10-CM | POA: Diagnosis not present

## 2022-12-24 DIAGNOSIS — E785 Hyperlipidemia, unspecified: Secondary | ICD-10-CM

## 2022-12-24 DIAGNOSIS — I10 Essential (primary) hypertension: Secondary | ICD-10-CM | POA: Diagnosis not present

## 2022-12-24 NOTE — Patient Instructions (Addendum)
Medication Instructions:  The current medical regimen is effective;  continue present plan and medications as directed. Please refer to the Current Medication list given to you today.  *If you need a refill on your cardiac medications before your next appointment, please call your pharmacy*  Lab Work: NONE If you have labs (blood work) drawn today and your tests are completely normal, you will receive your results only by:  MyChart Message (if you have MyChart) OR A paper copy in the mail If you have any lab test that is abnormal or we need to change your treatment, we will call you to review the results.  Testing/Procedures: NONE  Follow-Up: At [facility], you and your health needs are our priority.  As part of our continuing mission to provide you with exceptional heart care, we have created designated Provider Care Teams.  These Care Teams include your primary Cardiologist (physician) and Advanced Practice Providers (APPs -  Physician Assistants and Nurse Practitioners) who all work together to provide you with the care you need, when you need it.  Your next appointment:   9-12 month(s)  Provider:   Little Ishikawa, MD

## 2023-01-06 DIAGNOSIS — G8929 Other chronic pain: Secondary | ICD-10-CM | POA: Insufficient documentation

## 2023-01-14 ENCOUNTER — Other Ambulatory Visit: Payer: Self-pay | Admitting: Optometry

## 2023-01-14 DIAGNOSIS — H532 Diplopia: Secondary | ICD-10-CM

## 2023-01-14 DIAGNOSIS — H4921 Sixth [abducent] nerve palsy, right eye: Secondary | ICD-10-CM

## 2023-01-19 ENCOUNTER — Ambulatory Visit: Admission: RE | Admit: 2023-01-19 | Payer: Medicare HMO | Source: Ambulatory Visit

## 2023-01-20 ENCOUNTER — Other Ambulatory Visit (INDEPENDENT_AMBULATORY_CARE_PROVIDER_SITE_OTHER): Payer: Medicare HMO

## 2023-01-20 ENCOUNTER — Encounter: Payer: Self-pay | Admitting: Orthopaedic Surgery

## 2023-01-20 ENCOUNTER — Ambulatory Visit: Payer: Medicare HMO | Admitting: Orthopaedic Surgery

## 2023-01-20 DIAGNOSIS — Z96642 Presence of left artificial hip joint: Secondary | ICD-10-CM

## 2023-01-20 NOTE — Progress Notes (Signed)
 The patient is now past 8 months status post a left total hip arthroplasty to treat significant left hip arthritis.  We actually replaced his right hip in 2016.  He says he is doing well and has good range of motion of his hips and has no issues at all.  I was able to review his past medical history and he has been clear of B-cell lymphoma and has follow-up in a year.  His wife is with him today and overall they are both pleased.  An AP pelvis and lateral of his more recent left hip shows well-seated bilateral total hip arthroplasties with no complicating features.  On exam both hips move smoothly and fluidly.  His ligaments appear equal.  He walks with a normal gait.  At this point follow-up for his hips can be as needed.  He understands that if there is any issues at all as a relates to his hips not to hesitate to call and see us  or see us  for any other other musculoskeletal complaints.  All questions and concerns were addressed and answered.

## 2023-02-04 ENCOUNTER — Ambulatory Visit
Admission: RE | Admit: 2023-02-04 | Discharge: 2023-02-04 | Disposition: A | Discharge status: Home / Self Care | Payer: Medicare HMO | Source: Ambulatory Visit | Attending: Optometry | Admitting: Optometry

## 2023-02-04 DIAGNOSIS — H532 Diplopia: Secondary | ICD-10-CM | POA: Diagnosis not present

## 2023-02-04 DIAGNOSIS — H4921 Sixth [abducent] nerve palsy, right eye: Secondary | ICD-10-CM | POA: Diagnosis not present

## 2023-02-04 MED ORDER — GADOBUTROL 1 MMOL/ML IV SOLN
10.0000 mL | Freq: Once | INTRAVENOUS | Status: AC | PRN
  Administered 2023-02-04: 10 mL via INTRAVENOUS

## 2023-02-07 NOTE — Progress Notes (Unsigned)
   Keyera Hattabaugh T. Stephen Baruch, MD, CAQ Sports Medicine [facility] [address] [place], [address]  Phone: [phone]  FAX: [phone]  AMMAAR ENCINA - 68 y.o. male  MRN [id number]  Date of Birth: [birth year]  Date: 02/08/2023  PCP: Hannah Beat, MD  Referral: Hannah Beat, MD  No chief complaint on file.  Subjective:   LADD CEN is a 68 y.o. very pleasant male patient with There is no height or weight on file to calculate BMI. who presents with the following:  [name] is a very well-known patient for many years.  He presents with some ongoing elbow pain over the last few weeks.    Review of Systems is noted in the HPI, as appropriate  Objective:   There were no vitals taken for this visit.  GEN: No acute distress; alert,appropriate. PULM: Breathing comfortably in no respiratory distress PSYCH: Normally interactive.   Laboratory and Imaging Data:  Assessment and Plan:   ***

## 2023-02-08 ENCOUNTER — Encounter: Payer: Self-pay | Admitting: Family Medicine

## 2023-02-08 ENCOUNTER — Ambulatory Visit (INDEPENDENT_AMBULATORY_CARE_PROVIDER_SITE_OTHER): Payer: Medicare HMO | Admitting: Family Medicine

## 2023-02-08 VITALS — BP 110/70 | HR 73 | Temp 98.2°F | Ht 70.0 in | Wt 222.0 lb

## 2023-02-08 DIAGNOSIS — M7711 Lateral epicondylitis, right elbow: Secondary | ICD-10-CM | POA: Diagnosis not present

## 2023-02-08 DIAGNOSIS — M7712 Lateral epicondylitis, left elbow: Secondary | ICD-10-CM

## 2023-02-08 DIAGNOSIS — M25521 Pain in right elbow: Secondary | ICD-10-CM

## 2023-02-08 MED ORDER — PANTOPRAZOLE SODIUM 40 MG PO TBEC: 40.0000 mg | DELAYED_RELEASE_TABLET | Freq: Every day | ORAL | 3 refills | Status: DC

## 2023-02-08 MED ORDER — TRIAMCINOLONE ACETONIDE 40 MG/ML IJ SUSP: 40.0000 mg | Freq: Once | INTRAMUSCULAR | Status: DC

## 2023-02-08 MED ORDER — TRIAMCINOLONE ACETONIDE 40 MG/ML IJ SUSP
20.0000 mg | Freq: Once | INTRAMUSCULAR | Status: AC
  Administered 2023-02-08: 20 mg via INTRA_ARTICULAR

## 2023-02-23 ENCOUNTER — Ambulatory Visit: Payer: Medicare HMO

## 2023-03-31 ENCOUNTER — Ambulatory Visit: Payer: Medicare HMO | Admitting: Neurology

## 2023-04-02 ENCOUNTER — Telehealth: Payer: Self-pay

## 2023-04-02 NOTE — Telephone Encounter (Signed)
 Copied from CRM [id number]. Topic: Appointments - Appointment Scheduling >> Apr 02, 2023 12:51 PM Charles [name] wrote: Patient/patient representative is calling to schedule an appointment. Refer to attachments for appointment information. Patient is calling because he feels like he suffered a Left elbow tear while playing golf. There were no available appointments with Dr.Copland until Monday. Patient would like to know if there is any way to squeeze him in today. They scheduled the Monday 04/05/2023 appointment in case Dr.Copland can't see him today.

## 2023-04-02 NOTE — Telephone Encounter (Signed)
 Correct.  It is only an FYI unless you think he shouldn't wait until Monday and needs to go to [facility] walk-in clinic.

## 2023-04-04 NOTE — Progress Notes (Unsigned)
   Charles Bena T. Charles Bethune, MD, CAQ Sports Medicine [facility] [address] [place], [address]  Phone: [phone]  FAX: [phone]  BRISTOL SOY - 68 y.o. male  MRN [id number]  Date of Birth: 1955-08-18  Date: 04/05/2023  PCP: Hannah Beat, MD  Referral: Hannah Beat, MD  No chief complaint on file.  Subjective:   Charles [name] is a 68 y.o. very pleasant male patient with There is no height or weight on file to calculate BMI. who presents with the following:  Charles [name] presents with an acute elbow injury that he sustained while he was playing golf.  I did see him previously with some tennis elbow bilaterally in February 2025.    Review of Systems is noted in the HPI, as appropriate  Objective:   There were no vitals taken for this visit.  GEN: No acute distress; alert,appropriate. PULM: Breathing comfortably in no respiratory distress PSYCH: Normally interactive.   Laboratory and Imaging Data:  Assessment and Plan:   ***

## 2023-04-05 ENCOUNTER — Encounter: Payer: Self-pay | Admitting: Family Medicine

## 2023-04-05 ENCOUNTER — Ambulatory Visit (INDEPENDENT_AMBULATORY_CARE_PROVIDER_SITE_OTHER): Admitting: Family Medicine

## 2023-04-05 VITALS — BP 138/76 | HR 70 | Temp 98.0°F | Ht 70.0 in | Wt 231.1 lb

## 2023-04-05 DIAGNOSIS — M25522 Pain in left elbow: Secondary | ICD-10-CM | POA: Diagnosis not present

## 2023-04-05 NOTE — Patient Instructions (Signed)
 Voltaren 1% gel, over the counter ?You can apply up to 4 times a day ? ?This can be applied to any joint: knee, wrist, fingers, elbows, shoulders, feet and ankles. ?Can apply to any tendon: tennis elbow, achilles, tendon, rotator cuff or any other tendon. ? ?Minimal is absorbed in the bloodstream: ok with oral anti-inflammatory or a blood thinner. ? ?Cost is about 9 dollars  ?

## 2023-04-12 ENCOUNTER — Encounter: Payer: Self-pay | Admitting: Family Medicine

## 2023-04-12 ENCOUNTER — Other Ambulatory Visit (INDEPENDENT_AMBULATORY_CARE_PROVIDER_SITE_OTHER): Payer: Medicare HMO

## 2023-04-12 DIAGNOSIS — C61 Malignant neoplasm of prostate: Secondary | ICD-10-CM

## 2023-04-12 DIAGNOSIS — Z79899 Other long term (current) drug therapy: Secondary | ICD-10-CM | POA: Diagnosis not present

## 2023-04-12 DIAGNOSIS — E559 Vitamin D deficiency, unspecified: Secondary | ICD-10-CM

## 2023-04-12 DIAGNOSIS — E119 Type 2 diabetes mellitus without complications: Secondary | ICD-10-CM | POA: Diagnosis not present

## 2023-04-12 DIAGNOSIS — E782 Mixed hyperlipidemia: Secondary | ICD-10-CM | POA: Diagnosis not present

## 2023-04-12 DIAGNOSIS — E538 Deficiency of other specified B group vitamins: Secondary | ICD-10-CM | POA: Diagnosis not present

## 2023-04-12 LAB — MICROALBUMIN / CREATININE URINE RATIO
Creatinine,U: 133.5 mg/dL
Microalb Creat Ratio: 8.9 mg/g (ref 0.0–30.0)
Microalb, Ur: 1.2 mg/dL (ref 0.0–1.9)

## 2023-04-12 LAB — CBC WITH DIFFERENTIAL/PLATELET
Basophils Absolute: 0.1 10*3/uL (ref 0.0–0.1)
Basophils Relative: 1 % (ref 0.0–3.0)
Eosinophils Absolute: 0.5 10*3/uL (ref 0.0–0.7)
Eosinophils Relative: 7.7 % — ABNORMAL HIGH (ref 0.0–5.0)
HCT: 41.8 % (ref 39.0–52.0)
Hemoglobin: 14.1 g/dL (ref 13.0–17.0)
Lymphocytes Relative: 15.4 % (ref 12.0–46.0)
Lymphs Abs: 0.9 10*3/uL (ref 0.7–4.0)
MCHC: 33.7 g/dL (ref 30.0–36.0)
MCV: 92.9 fl (ref 78.0–100.0)
Monocytes Absolute: 0.7 10*3/uL (ref 0.1–1.0)
Monocytes Relative: 11.3 % (ref 3.0–12.0)
Neutro Abs: 3.8 10*3/uL (ref 1.4–7.7)
Neutrophils Relative %: 64.6 % (ref 43.0–77.0)
Platelets: 216 10*3/uL (ref 150.0–400.0)
RBC: 4.5 Mil/uL (ref 4.22–5.81)
RDW: 14.3 % (ref 11.5–15.5)
WBC: 5.9 10*3/uL (ref 4.0–10.5)

## 2023-04-12 LAB — HEPATIC FUNCTION PANEL
ALT: 109 U/L — ABNORMAL HIGH (ref 0–53)
AST: 52 U/L — ABNORMAL HIGH (ref 0–37)
Albumin: 4.2 g/dL (ref 3.5–5.2)
Alkaline Phosphatase: 226 U/L — ABNORMAL HIGH (ref 39–117)
Bilirubin, Direct: 0.2 mg/dL (ref 0.0–0.3)
Total Bilirubin: 1 mg/dL (ref 0.2–1.2)
Total Protein: 6.9 g/dL (ref 6.0–8.3)

## 2023-04-12 LAB — BASIC METABOLIC PANEL WITH GFR
BUN: 20 mg/dL (ref 6–23)
CO2: 30 meq/L (ref 19–32)
Calcium: 9.1 mg/dL (ref 8.4–10.5)
Chloride: 102 meq/L (ref 96–112)
Creatinine, Ser: 0.68 mg/dL (ref 0.40–1.50)
GFR: 95.03 mL/min (ref >60.00)
Glucose, Bld: 141 mg/dL — ABNORMAL HIGH (ref 70–99)
Potassium: 4.2 meq/L (ref 3.5–5.1)
Sodium: 139 meq/L (ref 135–145)

## 2023-04-12 LAB — LIPID PANEL
Cholesterol: 128 mg/dL (ref 0–200)
HDL: 50.1 mg/dL (ref >39.00)
LDL Cholesterol: 64 mg/dL (ref 0–99)
NonHDL: 78.36
Total CHOL/HDL Ratio: 3
Triglycerides: 73 mg/dL (ref 0.0–149.0)
VLDL: 14.6 mg/dL (ref 0.0–40.0)

## 2023-04-12 LAB — VITAMIN D 25 HYDROXY (VIT D DEFICIENCY, FRACTURES): VITD: 57.5 ng/mL (ref 30.00–100.00)

## 2023-04-12 LAB — HEMOGLOBIN A1C: Hgb A1c MFr Bld: 7.2 % — ABNORMAL HIGH (ref 4.6–6.5)

## 2023-04-12 LAB — VITAMIN B12: Vitamin B-12: >1537 pg/mL — ABNORMAL HIGH (ref 211–911)

## 2023-04-12 NOTE — Telephone Encounter (Signed)
 Yes.  Reviewed on phone.

## 2023-04-13 ENCOUNTER — Encounter: Payer: Self-pay | Admitting: Cardiology

## 2023-04-13 LAB — PSA, TOTAL WITH REFLEX TO PSA, FREE: PSA, Total: 3.7 ng/mL (ref <=4.0)

## 2023-04-14 NOTE — Telephone Encounter (Signed)
 Can we add him onto my schedule on May 14?  Would let him know I am at [facility] that day, looks like there are a lot of open spots

## 2023-04-21 NOTE — Progress Notes (Unsigned)
     Charles Anselmo T. Juliano Mceachin, MD, CAQ Sports Medicine [facility] [address] [place], [address]  Phone: [phone]  FAX: [phone]  [name] - 68 y.o. male  MRN [id number]  Date of Birth: [birth year]  Date: 04/22/2023  PCP: Scherrie Curt, MD  Referral: Scherrie Curt, MD  No chief complaint on file.  Subjective:   Charles [name] is a 68 y.o. very pleasant male patient with There is no height or weight on file to calculate BMI. who presents with the following:  Charles [name] is a very well-known patient.  He is here to follow-up predominantly about his diabetes, high blood pressure, hyperlipidemia.  I also saw him several weeks ago with some acute abrupt left-sided elbow pain when he was playing golf.  Notably, his LFTs are significantly elevated.  Diabetes Mellitus: Tolerating Medications: yes Compliance with diet: fair, There is no height or weight on file to calculate BMI. Exercise: minimal / intermittent Avg blood sugars at home: not checking Foot problems: none Hypoglycemia: none No nausea, vomitting, blurred vision, polyuria.  Lab Results  Component Value Date   HGBA1C 7.2 (H) 04/12/2023   HGBA1C 6.6 (A) 10/12/2022   HGBA1C 7.1 (H) 04/01/2022   Lab Results  Component Value Date   MICROALBUR 1.2 04/12/2023   LDLCALC 64 04/12/2023   CREATININE 0.68 04/12/2023    Wt Readings from Last 3 Encounters:  04/05/23 231 lb 2 oz (104.8 kg)  02/08/23 222 lb (100.7 kg)  02/04/23 225 lb (102.1 kg)    HTN: Tolerating all medications without side effects Stable and at goal No CP, no sob. No HA.  BP Readings from Last 3 Encounters:  04/05/23 138/76  02/08/23 110/70  12/24/22 132/76    Basic Metabolic Panel:    Component Value Date/Time   NA 139 04/12/2023 0734   NA 139 01/11/2020 1249   K 4.2 04/12/2023 0734   CL 102 04/12/2023 0734   CO2 30 04/12/2023 0734   BUN 20 04/12/2023 0734   BUN 14 01/11/2020 1249    CREATININE 0.68 04/12/2023 0734   CREATININE 1.17 11/21/2021 1422   GLUCOSE 141 (H) 04/12/2023 0734   CALCIUM 9.1 04/12/2023 0734    Lipids: Doing well, stable. Tolerating meds fine with no SE. Panel reviewed with patient.  Lipids: Lab Results  Component Value Date   CHOL 128 04/12/2023   Lab Results  Component Value Date   HDL 50.10 04/12/2023   Lab Results  Component Value Date   LDLCALC 64 04/12/2023   Lab Results  Component Value Date   TRIG 73.0 04/12/2023   Lab Results  Component Value Date   CHOLHDL 3 04/12/2023    Lab Results  Component Value Date   ALT 109 (H) 04/12/2023   AST 52 (H) 04/12/2023   ALKPHOS 226 (H) 04/12/2023   BILITOT 1.0 04/12/2023     Review of Systems is noted in the HPI, as appropriate  Objective:   There were no vitals taken for this visit.  GEN: No acute distress; alert,appropriate. PULM: Breathing comfortably in no respiratory distress PSYCH: Normally interactive.   Laboratory and Imaging Data:  Assessment and Plan:   ***

## 2023-04-22 ENCOUNTER — Ambulatory Visit: Payer: Medicare HMO | Admitting: Family Medicine

## 2023-04-22 ENCOUNTER — Encounter: Payer: Self-pay | Admitting: Family Medicine

## 2023-04-22 VITALS — BP 116/76 | HR 67 | Temp 97.9°F | Ht 69.5 in | Wt 229.0 lb

## 2023-04-22 DIAGNOSIS — Z0000 Encounter for general adult medical examination without abnormal findings: Secondary | ICD-10-CM

## 2023-04-22 DIAGNOSIS — B078 Other viral warts: Secondary | ICD-10-CM | POA: Diagnosis not present

## 2023-04-22 DIAGNOSIS — R748 Abnormal levels of other serum enzymes: Secondary | ICD-10-CM | POA: Diagnosis not present

## 2023-04-22 DIAGNOSIS — G8929 Other chronic pain: Secondary | ICD-10-CM

## 2023-04-22 DIAGNOSIS — I10 Essential (primary) hypertension: Secondary | ICD-10-CM

## 2023-04-22 DIAGNOSIS — E119 Type 2 diabetes mellitus without complications: Secondary | ICD-10-CM

## 2023-04-22 DIAGNOSIS — E782 Mixed hyperlipidemia: Secondary | ICD-10-CM

## 2023-04-22 LAB — HEPATIC FUNCTION PANEL
ALT: 77 U/L — ABNORMAL HIGH (ref 0–53)
AST: 38 U/L — ABNORMAL HIGH (ref 0–37)
Albumin: 4.6 g/dL (ref 3.5–5.2)
Alkaline Phosphatase: 162 U/L — ABNORMAL HIGH (ref 39–117)
Bilirubin, Direct: 0.2 mg/dL (ref 0.0–0.3)
Total Bilirubin: 0.9 mg/dL (ref 0.2–1.2)
Total Protein: 7.1 g/dL (ref 6.0–8.3)

## 2023-04-22 NOTE — Progress Notes (Signed)
 Hearing Screening - Comments:: Passed whisper test Vision Screening - Comments:: October 2024

## 2023-04-23 ENCOUNTER — Encounter: Payer: Self-pay | Admitting: Family Medicine

## 2023-04-23 LAB — ACUTE VIRAL HEPATITIS (HAV, HBV, HCV)
HCV Ab: NONREACTIVE
Hep A IgM: NEGATIVE
Hep B C IgM: NEGATIVE
Hepatitis B Surface Ag: NEGATIVE

## 2023-04-23 LAB — HCV INTERPRETATION

## 2023-04-27 ENCOUNTER — Encounter: Payer: Self-pay | Admitting: Family Medicine

## 2023-04-27 NOTE — Telephone Encounter (Signed)
 Can you call to assist with RUQ ultrasound appointment?  Thanks.

## 2023-04-29 ENCOUNTER — Ambulatory Visit (HOSPITAL_BASED_OUTPATIENT_CLINIC_OR_DEPARTMENT_OTHER)
Admission: RE | Admit: 2023-04-29 | Discharge: 2023-04-29 | Disposition: A | Discharge status: Home / Self Care | Source: Ambulatory Visit | Attending: Family Medicine | Admitting: Family Medicine

## 2023-04-29 DIAGNOSIS — K7689 Other specified diseases of liver: Secondary | ICD-10-CM | POA: Diagnosis not present

## 2023-04-29 DIAGNOSIS — R748 Abnormal levels of other serum enzymes: Secondary | ICD-10-CM | POA: Insufficient documentation

## 2023-04-30 ENCOUNTER — Encounter: Payer: Self-pay | Admitting: Family Medicine

## 2023-05-06 ENCOUNTER — Other Ambulatory Visit: Payer: Self-pay | Admitting: Family Medicine

## 2023-05-06 ENCOUNTER — Ambulatory Visit: Payer: Medicare HMO

## 2023-05-11 ENCOUNTER — Encounter: Payer: Self-pay | Admitting: Family Medicine

## 2023-05-11 ENCOUNTER — Other Ambulatory Visit (INDEPENDENT_AMBULATORY_CARE_PROVIDER_SITE_OTHER): Admitting: Family Medicine

## 2023-05-11 ENCOUNTER — Other Ambulatory Visit (INDEPENDENT_AMBULATORY_CARE_PROVIDER_SITE_OTHER)

## 2023-05-11 ENCOUNTER — Encounter: Payer: Self-pay | Admitting: Neurology

## 2023-05-11 ENCOUNTER — Ambulatory Visit: Payer: Medicare HMO | Admitting: Neurology

## 2023-05-11 VITALS — BP 128/75 | HR 75 | Ht 69.5 in | Wt 233.0 lb

## 2023-05-11 DIAGNOSIS — R748 Abnormal levels of other serum enzymes: Secondary | ICD-10-CM

## 2023-05-11 DIAGNOSIS — E1142 Type 2 diabetes mellitus with diabetic polyneuropathy: Secondary | ICD-10-CM

## 2023-05-11 DIAGNOSIS — G621 Alcoholic polyneuropathy: Secondary | ICD-10-CM

## 2023-05-11 LAB — HEPATIC FUNCTION PANEL
ALT: 327 U/L — ABNORMAL HIGH (ref 0–53)
AST: 121 U/L — ABNORMAL HIGH (ref 0–37)
Albumin: 4.1 g/dL (ref 3.5–5.2)
Alkaline Phosphatase: 106 U/L (ref 39–117)
Bilirubin, Direct: 0.1 mg/dL (ref 0.0–0.3)
Total Bilirubin: 0.7 mg/dL (ref 0.2–1.2)
Total Protein: 6.8 g/dL (ref 6.0–8.3)

## 2023-05-11 MED ORDER — GABAPENTIN 300 MG PO CAPS: ORAL_CAPSULE | ORAL | 1 refills | Status: DC

## 2023-05-11 NOTE — Progress Notes (Signed)
 Follow-up Visit   Date: 05/11/2023    Charles [name] MRN: [id number] DOB: 12-12-1955    Charles [name] is a 68 y.o. right-handed Caucasian male with diet controlled diabetes mellitus, anxiety, GERD, prostate cancer, marginal zone B cell lymphoma, and history of alcohol use  returning to the clinic for follow-up of neuropathy.  The patient was accompanied to the clinic by wife (retired Engineer, civil (consulting)) who also provides collateral information.    IMPRESSION/PLAN: Peripheral neuropathy manifesting with painful paresthesias of the feet.  Risk factors:  diabetes, history of alcohol abuse.  Labs for secondary causes of neuropathy are normal.  He continues to have breakthrough pain and would like to try increasing gabapentin .   - Continue gabapentin  600mg  at 4pm and increase to 900mg  at bedtime  Return to clinic in 6 months  --------------------------------------------- History of present illness: Starting about 12 years ago, he began having burning, stinging, and tingling in the feet and distal weakness. Symptoms are constant and worse in the evening when he tries to rest.  Nothing has worsened symptoms.  He has tried a foot massage which provides some relief.  He has noticed that his toes are harder to move, because they feel stuck.  Balance is good, although he had one fall today in the shower while trying to practice his golf swing.  He did not hurt himself.     He had diabetes for the past 3 years, which is diet controlled.  For about 25 years, until the mid 2000s, he was drinking excessively, sometimes 6 pack of beer nightly.  He has lost his drivers license on several occasions because of alcohol abuse. He is currently sober. No exposure to chemotherapy  UPDATE 03/16/2022:  He is here for follow-up visit.  He was started on gabapentin  300mg  at the last visit, however he has not noticed any improvement in the burning pain in the feet.  Pain is always worse in the evening.  He denies side  effects. No new weakness or imbalance.   He had PET scan today to assess response to chemotherapy for B cell lymphoma. He is understandably nervous about this.    UPDATE 07/21/2022:  He is here for follow-up visit.  He reports that his feel continue to have burning pain, which is worse when at rest or in the evening.  Unfortunately, he did not appreciate any change when he increased the dose of gabapentin  to 600mg  at bedtime.  He underwent left hip replacement, which has helped with walking. He will be having upcoming sinus surgery.  No new complaints.    UPDATE 05/11/2023:  He is here for follow-up visit.  He felt that adjusting gabapentin  to 600mg  at 4p and 9pm has helped.  It tends to bother him about 3-4 times per week.  Wife says that he is moving his legs less.  No interval falls.    Medications:  Current Outpatient Medications on File Prior to Visit  Medication Sig Dispense Refill   acetaminophen  (TYLENOL ) 500 MG tablet Take 1,000 mg by mouth every 6 (six) hours as needed for moderate pain.     Alcohol Swabs (B-D SINGLE USE SWABS REGULAR) PADS Use to check blood sugar up to 2 times a day 100 each 5   aspirin  EC 81 MG tablet Take 81 mg by mouth daily. Swallow whole.     atorvastatin  (LIPITOR) 80 MG tablet TAKE 1 TABLET BY MOUTH EVERY DAY (Patient not taking: Reported on 04/22/2023) 90 tablet 3  cetirizine  (ZYRTEC ) 10 MG tablet Take 10 mg by mouth daily.     Cholecalciferol  (VITAMIN D ) 50 MCG (2000 UT) tablet Take 2,000 Units by mouth daily.     CLINPRO 5000 1.1 % PSTE Place 1 Application onto teeth daily.     clobetasol  ointment (TEMOVATE ) 0.05 % Apply 1 Application topically 2 (two) times daily as needed.     Flaxseed, Linseed, (FLAX SEED OIL PO) Take 1 capsule by mouth daily.     FLUoxetine  (PROZAC ) 20 MG capsule Take 1 capsule (20 mg total) by mouth daily. 90 capsule 1   fluticasone (FLONASE) 50 MCG/ACT nasal spray Place 2 sprays into both nostrils daily.     gabapentin  (NEURONTIN ) 300 MG  capsule Take 2 tablet at 4pm and 9pm. 360 capsule 1   glucose blood test strip SMARTSIG:Via Meter 1-2 Times Daily     Lancets (ONETOUCH DELICA PLUS LANCET30G) MISC USE TO CHECK BLOOD SUGAR UP TO 2 TIMES A DAY. 100 each 5   LORazepam  (ATIVAN ) 0.5 MG tablet Take 1 tablet (0.5 mg total) by mouth every 8 (eight) hours as needed. for anxiety 30 tablet 3   losartan  (COZAAR ) 25 MG tablet TAKE 1 TABLET (25 MG TOTAL) BY MOUTH DAILY. 90 tablet 1   metFORMIN  (GLUCOPHAGE -XR) 500 MG 24 hr tablet TAKE 4 TABLETS BY MOUTH DAILY WITH BREAKFAST. 360 tablet 1   Multiple Vitamins-Minerals (MULTIVITAMIN WITH MINERALS) tablet Take 1 tablet by mouth daily.     Olopatadine  HCl (PATADAY ) 0.7 % SOLN Place 1 drop into both eyes daily.     pantoprazole  (PROTONIX ) 40 MG tablet Take 1 tablet (40 mg total) by mouth daily. 90 tablet 3   Polyethyl Glycol-Propyl Glycol (SYSTANE OP) Place 1 drop into both eyes as needed (dry eyes).     sildenafil  (VIAGRA ) 100 MG tablet Take 0.5-1 tablets (50-100 mg total) by mouth daily as needed for erectile dysfunction. 5 tablet 11   No current facility-administered medications on file prior to visit.    Allergies: No Known Allergies  Vital Signs:  BP 128/75   Pulse 75   Ht 5' 9.5" (1.765 m)   Wt 233 lb (105.7 kg)   SpO2 97%   BMI 33.91 kg/m    Neurological Exam: MENTAL STATUS including orientation to time, place, person, recent and remote memory, attention span and concentration, language, and fund of knowledge is normal.  Speech is not dysarthric.  CRANIAL NERVES: Pupils equal round and reactive to light.  Normal conjugate, extra-ocular eye movements in all directions of gaze.  No ptosis .  Face is symmetric.   MOTOR:  Motor strength is 5/5 in all extremities, .  No atrophy, fasciculations or abnormal movements.  No pronator drift.  Tone is normal.    MSRs:  Reflexes are 2+/4 throughout.  SENSORY:  Intact to vibration at the ankles and great toe.  COORDINATION/GAIT:  Normal  finger-to- nose-finger.  Gait narrow based and stable.   Data:  Lab Results  Component Value Date   VITAMINB12 >1537 (H) 04/12/2023   Lab Results  Component Value Date   HGBA1C 7.2 (H) 04/12/2023     Thank you for allowing me to participate in patient's care.  If I can answer any additional questions, I would be pleased to do so.    Sincerely,    Hawke Villalpando K. Lydia Sams, DO

## 2023-05-11 NOTE — Patient Instructions (Signed)
 Take gabapentin  600mg  at 4p and 900mg  at 9p.

## 2023-05-12 ENCOUNTER — Other Ambulatory Visit: Payer: Self-pay | Admitting: Family Medicine

## 2023-05-12 ENCOUNTER — Encounter: Payer: Self-pay | Admitting: Family Medicine

## 2023-05-12 ENCOUNTER — Ambulatory Visit (INDEPENDENT_AMBULATORY_CARE_PROVIDER_SITE_OTHER)

## 2023-05-12 DIAGNOSIS — E538 Deficiency of other specified B group vitamins: Secondary | ICD-10-CM

## 2023-05-12 LAB — VITAMIN B12: Vitamin B-12: 979 pg/mL — ABNORMAL HIGH (ref 211–911)

## 2023-05-12 NOTE — Telephone Encounter (Signed)
 Can you add a B12 level to the lab?

## 2023-05-12 NOTE — Telephone Encounter (Signed)
 Marijean Shouts, I know you have seen [place] before a number of times, particularly for his lymphoma.  His AST and ALT have jumped up a lot, and they have historically been normal.  He does have some mild fatty liver, but I would not expect them to go this high from that.  I hope that you or one of your partners would not mind seeing him for this to check it out in more detail.  I would appreciate the help.

## 2023-05-12 NOTE — Telephone Encounter (Signed)
 He has blood in the lab, can we add the b12 to the blood in the lab?

## 2023-05-18 ENCOUNTER — Ambulatory Visit: Payer: Medicare HMO

## 2023-05-18 NOTE — Progress Notes (Unsigned)
 Cardiology Office Note:    Date:  05/19/2023   ID:  Charles [name], DOB 1955/09/21, MRN [id number]  PCP:  Scherrie Curt, MD  Cardiologist:  Wendie Hamburg, MD  Electrophysiologist:  None   Referring MD: Scherrie Curt, MD   Chief Complaint  Patient presents with   Coronary Artery Disease    History of Present Illness:    Charles [name] is a 68 y.o. male with a hx of T2DM, lymphoma who presents for follow-up.  He was referred by Dr. Geralyn Knee for evaluation of CAD, initially seen on 10/27/2019.  He reports that he has been having intermittent chest pain, occurs a few times per month.  Describes sharp pain in the center of his chest that can last up to 1 minute.  Can occur at rest or with exertion.  Also reports getting short of breath with exertion.  Also has been having lightheadedness when he stands.  Denies any syncope, palpitations, or lower extremity edema.  Golfs 2-3 times per week for exercise, walks the course.  Smoked for 20 years, quit in 2002.  Family history includes father had MI at age 53, CVA in 4s.  Calcium  score 155 (63rd percentile) on 10/03/2019.  Coronary CTA was done on 01/18/2020, which showed calcium  score 165 (62nd percentile), nonobstructive CAD with mixed plaque in mid LAD causing 24 to 49% stenosis, calcified plaque in proximal LAD causing 0 to 24% stenosis, noncalcified plaque in proximal RCA causing 25 to 49% stenosis, calcified plaque in proximal and mid RCA causing 0 to 24% stenosis.  Echocardiogram on 11/03/2019 showed normal biventricular function, no significant valvular disease.  Since last clinic visit, reports he is doing okay.  Has been having workup for transaminitis, sees GI next month.  Denies any chest pain, dyspnea,  lower extremity edema, or palpitations.  Reports some lightheadedness with standing.  Past Medical History:  Diagnosis Date   Controlled type 2 diabetes mellitus without complication, without long-term current use of insulin  (HCC)    Diabetic neuropathy associated with type 2 diabetes mellitus (HCC)    Diverticulitis    Generalized anxiety disorder 10/21/2014   GERD (gastroesophageal reflux disease)    Hyperlipidemia    Hypertension 06/2021   Marginal zone B-cell lymphoma (HCC) 06/10/2020   Prostate cancer (HCC) 04/02/2021   Tubular adenoma of colon     Past Surgical History:  Procedure Laterality Date   ACHILLES TENDON REPAIR  2001   BICEPS TENDON REPAIR Left 2014   COLON SURGERY  2022   Polyps/ lymphoma tumor removed   COLONOSCOPY  2012   polyps-[place]    COSMETIC SURGERY  1995   Eye lids   HEMORROIDECTOMY  2012   NASAL SINUS SURGERY     09/14/22   ROTATOR CUFF REPAIR Left 2014   TOTAL HIP ARTHROPLASTY Right 12/07/2014   Procedure: RIGHT TOTAL HIP ARTHROPLASTY ANTERIOR APPROACH;  Surgeon: Arnie Lao, MD;  Location: [facility];  Service: Orthopedics;  Laterality: Right;   TOTAL HIP ARTHROPLASTY Left 05/29/2022   Procedure: LEFT TOTAL HIP ARTHROPLASTY ANTERIOR APPROACH;  Surgeon: Arnie Lao, MD;  Location: [facility];  Service: Orthopedics;  Laterality: Left;    Current Medications: Current Meds  Medication Sig   acetaminophen  (TYLENOL ) 500 MG tablet Take 1,000 mg by mouth every 6 (six) hours as needed for moderate pain.   Alcohol Swabs (B-D SINGLE USE SWABS REGULAR) PADS Use to check blood sugar up to 2 times a day   aspirin  EC 81 MG  tablet Take 81 mg by mouth daily. Swallow whole.   cetirizine  (ZYRTEC ) 10 MG tablet Take 10 mg by mouth daily.   Cholecalciferol  (VITAMIN D ) 50 MCG (2000 UT) tablet Take 2,000 Units by mouth daily.   CLINPRO 5000 1.1 % PSTE Place 1 Application onto teeth daily.   clobetasol  ointment (TEMOVATE ) 0.05 % Apply 1 Application topically 2 (two) times daily as needed.   Flaxseed, Linseed, (FLAX SEED OIL PO) Take 1 capsule by mouth daily.   FLUoxetine  (PROZAC ) 20 MG capsule Take 1 capsule (20 mg total) by mouth daily.   fluticasone (FLONASE) 50 MCG/ACT nasal  spray Place 2 sprays into both nostrils daily.   gabapentin  (NEURONTIN ) 300 MG capsule Take 2 tablet at 4pm and 3 tablets at 9pm.   glucose blood test strip SMARTSIG:Via Meter 1-2 Times Daily   Lancets (ONETOUCH DELICA PLUS LANCET30G) MISC USE TO CHECK BLOOD SUGAR UP TO 2 TIMES A DAY.   LORazepam  (ATIVAN ) 0.5 MG tablet Take 1 tablet (0.5 mg total) by mouth every 8 (eight) hours as needed. for anxiety   losartan  (COZAAR ) 25 MG tablet TAKE 1 TABLET (25 MG TOTAL) BY MOUTH DAILY.   metFORMIN  (GLUCOPHAGE -XR) 500 MG 24 hr tablet TAKE 4 TABLETS BY MOUTH DAILY WITH BREAKFAST.   Multiple Vitamins-Minerals (MULTIVITAMIN WITH MINERALS) tablet Take 1 tablet by mouth daily.   Olopatadine  HCl (PATADAY ) 0.7 % SOLN Place 1 drop into both eyes daily.   pantoprazole  (PROTONIX ) 40 MG tablet Take 1 tablet (40 mg total) by mouth daily.   Polyethyl Glycol-Propyl Glycol (SYSTANE OP) Place 1 drop into both eyes as needed (dry eyes).   sildenafil  (VIAGRA ) 100 MG tablet Take 0.5-1 tablets (50-100 mg total) by mouth daily as needed for erectile dysfunction.     Allergies:   Patient has no known allergies.   Social History   Socioeconomic History   Marital status: Married    Spouse name: [name]   Number of children: 1   Years of education: Not on file   Highest education level: Bachelor's degree (e.g., BA, AB, BS)  Occupational History   Occupation: insurance adj retired  Tobacco Use   Smoking status: Former    Current packs/day: 0.00    Average packs/day: 1 pack/day for 20.0 years (20.0 ttl pk-yrs)    Types: Cigarettes    Start date: 01/06/1983    Quit date: 01/06/2003    Years since quitting: 20.3    Passive exposure: Past   Smokeless tobacco: Former    Types: Chew    Quit date: 01/06/2004   Tobacco comments:    Also used chewing tobacco formerly  Vaping Use   Vaping status: Never Used  Substance and Sexual Activity   Alcohol use: Not Currently    Comment: Totally quit 2005   Drug use: No   Sexual  activity: Yes    Partners: Female    Birth control/protection: None    Comment: Wife had tubal ligation  Other Topics Concern   Not on file  Social History Narrative   Married, Web designer baseball player   Avid golfer      Are you right handed or left handed? Right Handed    Are you currently employed ? 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       Wife, [name], is a retired Engineer, civil (consulting)        Social Drivers of Corporate investment banker Strain: Low Risk  (  02/04/2023)   Overall Financial Resource Strain (CARDIA)    Difficulty of Paying Living Expenses: Not hard at all  Food Insecurity: No Food Insecurity (02/04/2023)   Hunger Vital Sign    Worried About Running Out of Food in the Last Year: Never true    Ran Out of Food in the Last Year: Never true  Transportation Needs: No Transportation Needs (02/04/2023)   PRAPARE - Administrator, Civil Service (Medical): No    Lack of Transportation (Non-Medical): No  Physical Activity: Sufficiently Active (02/04/2023)   Exercise Vital Sign    Days of Exercise per Week: 5 days    Minutes of Exercise per Session: 60 min  Stress: No Stress Concern Present (02/04/2023)   [employer] of Occupational Health - Occupational Stress Questionnaire    Feeling of Stress : Not at all  Social Connections: Socially Integrated (02/04/2023)   Social Connection and Isolation Panel [NHANES]    Frequency of Communication with Friends and Family: More than three times a week    Frequency of Social Gatherings with Friends and Family: More than three times a week    Attends Religious Services: More than 4 times per year    Active Member of [employer] or Organizations: Yes    Attends Engineer, structural: More than 4 times per year    Marital Status: Married     Family History: The patient's family history includes ADD / ADHD in his brother; Alcohol abuse in his brother, brother, and father; Anxiety  disorder in his brother; Asthma in his sister; Breast cancer (age of onset: 27) in his mother; Cancer in his mother; Diabetes in his mother; Hearing loss in his father; Heart attack in his father; Heart disease in his father; Hyperlipidemia in his brother, father, and sister; Hypertension in his father and mother; Obesity in his brother, mother, and sister; Stroke in his father and mother. There is no history of Colon cancer, Colon polyps, Esophageal cancer, Rectal cancer, or Stomach cancer.  ROS:   Please see the history of present illness.     All other systems reviewed and are negative.  EKGs/Labs/Other Studies Reviewed:    The following studies were reviewed today:   EKG:   11/20/21: Normal sinus rhythm, rate 69, no ST abnormalities 05/27/22: NSR, rate 66, poor R wave progression, no ST abnormalities 05/19/2023: Normal sinus rhythm, rate 67, no ST abnormalities  Recent Labs: 04/12/2023: BUN 20; Creatinine, Ser 0.68; Hemoglobin 14.1; Platelets 216.0; Potassium 4.2; Sodium 139 05/11/2023: ALT 327  Recent Lipid Panel    Component Value Date/Time   CHOL 128 04/12/2023 0734   CHOL 142 02/01/2020 0929   TRIG 73.0 04/12/2023 0734   HDL 50.10 04/12/2023 0734   HDL 41 02/01/2020 0929   CHOLHDL 3 04/12/2023 0734   VLDL 14.6 04/12/2023 0734   LDLCALC 64 04/12/2023 0734   LDLCALC 69 02/01/2020 0929   LDLDIRECT 141.0 10/21/2016 0833    Physical Exam:    VS:  BP 118/82   Pulse 84   Ht 5' 9.5" (1.765 m)   Wt 232 lb 14.4 oz (105.6 kg)   SpO2 92%   BMI 33.90 kg/m     Wt Readings from Last 3 Encounters:  05/19/23 232 lb 14.4 oz (105.6 kg)  05/11/23 233 lb (105.7 kg)  04/22/23 229 lb (103.9 kg)     GEN:  Well nourished, well developed in no acute distress HEENT: Normal NECK: No JVD; No carotid bruits CARDIAC: RRR, no  murmurs, rubs, gallops RESPIRATORY:  Clear to auscultation without rales, wheezing or rhonchi  ABDOMEN: Soft, non-tender, non-distended MUSCULOSKELETAL:  No edema; No  deformity  SKIN: Warm and dry NEUROLOGIC:  Alert and oriented x 3 PSYCHIATRIC:  Normal affect   ASSESSMENT:    1. Coronary artery disease involving native coronary artery of native heart without angina pectoris   2. Essential hypertension   3. Hyperlipidemia, unspecified hyperlipidemia type      PLAN:     CAD: Calcium  score 155 (63rd percentile) on 10/03/2019.  He reports atypical chest pain and exertional dyspnea.  Coronary CTA was done on 01/18/2020, which showed calcium  score 165 (62nd percentile), nonobstructive CAD with mixed plaque in mid LAD causing 24 to 49% stenosis, calcified plaque in proximal LAD causing 0 to 24% stenosis, noncalcified plaque in proximal RCA causing 25 to 49% stenosis, calcified plaque in proximal and mid RCA causing 0 to 24% stenosis.  Echocardiogram on 11/03/2019 showed normal biventricular function, no significant valvular disease. -On atorvastatin  80 mg daily, LDL 60 on 04/01/22.  Atorvastatin  has been held due to recent issues with transaminitis.  Would continue to hold atorvastatin , recommend referral to pharmacy lipid clinic to evaluate for PCSK9 inhibitor  T2DM: A1c 7.2% on 04/12/2023.  On metformin   Hypertension: On losartan  25 mg daily.  Appears controlled  Hyperlipidemia: On atorvastatin  80 mg daily, LDL 60 on 04/01/22.  Atorvastatin  was held due to transaminitis as above, referred to pharmacy lipid clinic  Lightheadedness: Description suggest vertigo vs orthostasis, encouraged to stay hydrated.  Orthostatics in prior clinic visit showed drop in BP with standing but not meeting criteria for orthostatic hypotension.  -Denies any recent lightheadedness  Leg pain/numbness: normal ABIs on 11/08/19  Lymphoma: Status post immunotherapy, follows with hematology, in surveillance  Unequal blood pressure in upper extremities: Reports BP often 20 points lower in right arm.  Upper extremity arterial duplex unremarkable 06/2022  RTC in 6 months  Medication  Adjustments/Labs and Tests Ordered: Current medicines are reviewed at length with the patient today.  Concerns regarding medicines are outlined above.  Orders Placed This Encounter  Procedures   AMB Referral to Advanced Lipid Disorders Clinic   EKG 12-Lead   No orders of the defined types were placed in this encounter.   Patient Instructions  Medication Instructions:  Your physician recommends that you continue on your current medications as directed. Please refer to the Current Medication list given to you today.   Referrals: You ave been referred to our Lipid Clinic to discuss PCK9i.  Follow-Up: At [facility], you and your health needs are our priority.  As part of our continuing mission to provide you with exceptional heart care, our providers are all part of one team.  This team includes your primary Cardiologist (physician) and Advanced Practice Providers or APPs (Physician Assistants and Nurse Practitioners) who all work together to provide you with the care you need, when you need it.  Your next appointment:   6 month(s)  Provider:   Wendie Hamburg, MD    We recommend signing up for the patient portal called "MyChart".  Sign up information is provided on this After Visit Summary.  MyChart is used to connect with patients for Virtual Visits (Telemedicine).  Patients are able to view lab/test results, encounter notes, upcoming appointments, etc.  Non-urgent messages can be sent to your provider as well.   To learn more about what you can do with MyChart, go to [email or web address].    Signed,  Wendie Hamburg, MD  05/19/2023 12:05 PM    [facility]

## 2023-05-18 NOTE — Telephone Encounter (Signed)
 Sorry about the delay, I have reviewed the chart including recent right upper quadrant ultrasound  I agree that the enzyme elevation/AST/ALT is out of proportion to fatty liver inflammation alone His viral hepatitis serologies were negative Agree with holding the statin which appears is being done  Would have him return for the following lab work; also a visit with APP or me: Hepatic function panel, GGT, INR, IgG, ANA, anti-smooth muscle antibody, antimitochondrial antibody, ferritin, IBC panel, alpha-1 antitrypsin, ceruloplasmin Low threshold for cross-sectional imaging of the liver particularly given history of MALT but await the following labs first

## 2023-05-18 NOTE — Addendum Note (Signed)
 Addended by: Glennette Lanius on: 05/18/2023 05:09 PM   Modules accepted: Orders

## 2023-05-19 ENCOUNTER — Ambulatory Visit: Payer: Self-pay | Admitting: Internal Medicine

## 2023-05-19 ENCOUNTER — Ambulatory Visit (HOSPITAL_BASED_OUTPATIENT_CLINIC_OR_DEPARTMENT_OTHER): Admitting: Cardiology

## 2023-05-19 ENCOUNTER — Encounter (HOSPITAL_BASED_OUTPATIENT_CLINIC_OR_DEPARTMENT_OTHER): Payer: Self-pay | Admitting: Cardiology

## 2023-05-19 ENCOUNTER — Other Ambulatory Visit (INDEPENDENT_AMBULATORY_CARE_PROVIDER_SITE_OTHER)

## 2023-05-19 VITALS — BP 118/82 | HR 84 | Ht 69.5 in | Wt 232.9 lb

## 2023-05-19 DIAGNOSIS — I10 Essential (primary) hypertension: Secondary | ICD-10-CM | POA: Diagnosis not present

## 2023-05-19 DIAGNOSIS — R945 Abnormal results of liver function studies: Secondary | ICD-10-CM

## 2023-05-19 DIAGNOSIS — E785 Hyperlipidemia, unspecified: Secondary | ICD-10-CM

## 2023-05-19 DIAGNOSIS — I2510 Atherosclerotic heart disease of native coronary artery without angina pectoris: Secondary | ICD-10-CM | POA: Diagnosis not present

## 2023-05-19 DIAGNOSIS — R748 Abnormal levels of other serum enzymes: Secondary | ICD-10-CM | POA: Diagnosis not present

## 2023-05-19 LAB — GAMMA GT: GGT: 89 U/L — ABNORMAL HIGH (ref 7–51)

## 2023-05-19 LAB — HEPATIC FUNCTION PANEL
ALT: 239 U/L — ABNORMAL HIGH (ref 0–53)
AST: 85 U/L — ABNORMAL HIGH (ref 0–37)
Albumin: 4.2 g/dL (ref 3.5–5.2)
Alkaline Phosphatase: 89 U/L (ref 39–117)
Bilirubin, Direct: 0.1 mg/dL (ref 0.0–0.3)
Total Bilirubin: 0.9 mg/dL (ref 0.2–1.2)
Total Protein: 7.1 g/dL (ref 6.0–8.3)

## 2023-05-19 LAB — IBC + FERRITIN
Ferritin: 49.3 ng/mL (ref 22.0–322.0)
Iron: 120 ug/dL (ref 42–165)
Saturation Ratios: 37.8 % (ref 20.0–50.0)
TIBC: 317.8 ug/dL (ref 250.0–450.0)
Transferrin: 227 mg/dL (ref 212.0–360.0)

## 2023-05-19 LAB — PROTIME-INR
INR: 1.2 ratio — ABNORMAL HIGH (ref 0.8–1.0)
Prothrombin Time: 12.8 s (ref 9.6–13.1)

## 2023-05-19 NOTE — Patient Instructions (Signed)
 Medication Instructions:  Your physician recommends that you continue on your current medications as directed. Please refer to the Current Medication list given to you today.   Referrals: You ave been referred to our Lipid Clinic to discuss PCK9i.  Follow-Up: At [facility], you and your health needs are our priority.  As part of our continuing mission to provide you with exceptional heart care, our providers are all part of one team.  This team includes your primary Cardiologist (physician) and Advanced Practice Providers or APPs (Physician Assistants and Nurse Practitioners) who all work together to provide you with the care you need, when you need it.  Your next appointment:   6 month(s)  Provider:   Wendie Hamburg, MD    We recommend signing up for the patient portal called "MyChart".  Sign up information is provided on this After Visit Summary.  MyChart is used to connect with patients for Virtual Visits (Telemedicine).  Patients are able to view lab/test results, encounter notes, upcoming appointments, etc.  Non-urgent messages can be sent to your provider as well.   To learn more about what you can do with MyChart, go to [email or web address].

## 2023-05-22 LAB — ANA: Anti Nuclear Antibody (ANA): NEGATIVE

## 2023-05-22 LAB — CERULOPLASMIN: Ceruloplasmin: 20 mg/dL (ref 14–30)

## 2023-05-22 LAB — MITOCHONDRIAL ANTIBODIES: Mitochondrial M2 Ab, IgG: <=20 U (ref <=20.0)

## 2023-05-22 LAB — ANTI-SMOOTH MUSCLE ANTIBODY, IGG: Actin (Smooth Muscle) Antibody (IGG): 25 U — ABNORMAL HIGH (ref <20)

## 2023-05-22 LAB — ALPHA-1-ANTITRYPSIN: A-1 Antitrypsin, Ser: 126 mg/dL (ref 83–199)

## 2023-05-22 LAB — IGG: IgG (Immunoglobin G), Serum: 1376 mg/dL (ref 600–1540)

## 2023-05-24 ENCOUNTER — Other Ambulatory Visit: Payer: Self-pay

## 2023-05-24 DIAGNOSIS — C884 Extranodal marginal zone b-cell lymphoma of mucosa-associated lymphoid tissue (malt-lymphoma) not having achieved remission: Secondary | ICD-10-CM

## 2023-05-24 DIAGNOSIS — R7989 Other specified abnormal findings of blood chemistry: Secondary | ICD-10-CM

## 2023-05-27 ENCOUNTER — Encounter: Payer: Self-pay | Admitting: Internal Medicine

## 2023-05-27 ENCOUNTER — Other Ambulatory Visit: Payer: Self-pay | Admitting: Physician Assistant

## 2023-05-27 DIAGNOSIS — R748 Abnormal levels of other serum enzymes: Secondary | ICD-10-CM

## 2023-05-27 NOTE — H&P (Signed)
 Chief Complaint: Patient was seen in consultation today for elevated liver function tests, with consideration for random liver biopsy.  Referring Provider(s): Dr. Laurell Pond, MD   Supervising Physician: Elene Griffes  Patient Status: [facility] - Out-pt  Patient is Full Code  History of Present Illness: Charles [name] is a 68 y.o. male  with PMHx notable for HTN, HLD, T2DM, GERD, GAD, B-cell lymphoma, and prostate cancer.  Per Dr. Marietta Shorter progress note on 5/13: "I have reviewed the chart including recent right upper quadrant ultrasound   I agree that the enzyme elevation/AST/ALT is out of proportion to fatty liver inflammation alone His viral hepatitis serologies were negative Agree with holding the statin which appears is being done   Would have him return for [...] lab work; also a visit with APP or me.  [...] Low threshold for cross-sectional imaging of the liver particularly given history of MALT but await the following labs first."  MR Liver w/ & w/o CM pending on 5/27.  Interventional Radiology was requested for  random liver biopsy. Patient is scheduled for same in IR today.   Patient is alert and laying in bed, calm. Wife at bedside. Patient is currently without any significant complaints.  Patient denies any fevers, headache, chest pain, SOB, cough, abdominal pain, nausea, vomiting or bleeding.     Past Medical History:  Diagnosis Date   Controlled type 2 diabetes mellitus without complication, without long-term current use of insulin (HCC)    Diabetic neuropathy associated with type 2 diabetes mellitus (HCC)    Diverticulitis    Generalized anxiety disorder 10/21/2014   GERD (gastroesophageal reflux disease)    Hyperlipidemia    Hypertension 06/2021   Marginal zone B-cell lymphoma (HCC) 06/10/2020   Prostate cancer (HCC) 04/02/2021   Tubular adenoma of colon     Past Surgical History:  Procedure Laterality Date   ACHILLES TENDON REPAIR  2001   BICEPS  TENDON REPAIR Left 2014   COLON SURGERY  2022   Polyps/ lymphoma tumor removed   COLONOSCOPY  2012   polyps-[place]    COSMETIC SURGERY  1995   Eye lids   HEMORROIDECTOMY  2012   NASAL SINUS SURGERY     09/14/22   ROTATOR CUFF REPAIR Left 2014   TOTAL HIP ARTHROPLASTY Right 12/07/2014   Procedure: RIGHT TOTAL HIP ARTHROPLASTY ANTERIOR APPROACH;  Surgeon: Arnie Lao, MD;  Location: [facility] ORS;  Service: Orthopedics;  Laterality: Right;   TOTAL HIP ARTHROPLASTY Left 05/29/2022   Procedure: LEFT TOTAL HIP ARTHROPLASTY ANTERIOR APPROACH;  Surgeon: Arnie Lao, MD;  Location: [facility] OR[facility];  Service: Orthopedics;  Laterality: Left;    Allergies: Patient has no known allergies.  Medications: Prior to Admission medications   Medication Sig Start Date End Date Taking? Authorizing Provider  acetaminophen  (TYLENOL ) 500 MG tablet Take 1,000 mg by mouth every 6 (six) hours as needed for moderate pain.    [provider]  Alcohol Swabs (B-D SINGLE USE SWABS REGULAR) PADS Use to check blood sugar up to 2 times a day 04/07/21   Copland, Jolena Nay, MD  aspirin  EC 81 MG tablet Take 81 mg by mouth daily. Swallow whole.    [provider]  atorvastatin  (LIPITOR) 80 MG tablet TAKE 1 TABLET BY MOUTH EVERY DAY Patient not taking: Reported on 05/19/2023 11/06/22   Wendie Hamburg, MD  cetirizine  (ZYRTEC ) 10 MG tablet Take 10 mg by mouth daily. 01/05/17   [provider]  Cholecalciferol  (VITAMIN D )  50 MCG (2000 UT) tablet Take 2,000 Units by mouth daily.    [provider]  CLINPRO 5000 1.1 % PSTE Place 1 Application onto teeth daily. 09/22/20   [provider]  clobetasol  ointment (TEMOVATE ) 0.05 % Apply 1 Application topically 2 (two) times daily as needed.    [provider]  Flaxseed, Linseed, (FLAX SEED OIL PO) Take 1 capsule by mouth daily.    [provider]  FLUoxetine  (PROZAC ) 20 MG capsule Take 1 capsule (20 mg total) by mouth  daily. 11/27/22   Copland, Jolena Nay, MD  fluticasone (FLONASE) 50 MCG/ACT nasal spray Place 2 sprays into both nostrils daily.    [provider]  gabapentin  (NEURONTIN ) 300 MG capsule Take 2 tablet at 4pm and 3 tablets at 9pm. 05/11/23   Patel, Donika K, DO  glucose blood test strip SMARTSIG:Via Meter 1-2 Times Daily 08/27/22   [provider]  Lancets (ONETOUCH DELICA PLUS LANCET30G) MISC USE TO CHECK BLOOD SUGAR UP TO 2 TIMES A DAY. 05/12/22   Copland, Jolena Nay, MD  LORazepam  (ATIVAN ) 0.5 MG tablet Take 1 tablet (0.5 mg total) by mouth every 8 (eight) hours as needed. for anxiety 10/12/22   Copland, Jolena Nay, MD  losartan  (COZAAR ) 25 MG tablet TAKE 1 TABLET (25 MG TOTAL) BY MOUTH DAILY. 05/06/23   Copland, Jolena Nay, MD  metFORMIN  (GLUCOPHAGE -XR) 500 MG 24 hr tablet TAKE 4 TABLETS BY MOUTH DAILY WITH BREAKFAST. 05/06/23   Copland, Jolena Nay, MD  Multiple Vitamins-Minerals (MULTIVITAMIN WITH MINERALS) tablet Take 1 tablet by mouth daily.    [provider]  Olopatadine  HCl (PATADAY ) 0.7 % SOLN Place 1 drop into both eyes daily.    [provider]  pantoprazole  (PROTONIX ) 40 MG tablet Take 1 tablet (40 mg total) by mouth daily. 02/08/23   Copland, Jolena Nay, MD  Polyethyl Glycol-Propyl Glycol (SYSTANE OP) Place 1 drop into both eyes as needed (dry eyes).    [provider]  sildenafil  (VIAGRA ) 100 MG tablet Take 0.5-1 tablets (50-100 mg total) by mouth daily as needed for erectile dysfunction. 10/12/22   Copland, Jolena Nay, MD     Family History  Problem Relation Age of Onset   Alcohol abuse Father    Heart attack Father    Hyperlipidemia Father    Stroke Father    Hypertension Father    Hearing loss Father    Heart disease Father    Breast cancer Mother 3   Hypertension Mother    Cancer Mother    Diabetes Mother    Obesity Mother    Stroke Mother    ADD / ADHD Brother    Alcohol abuse Brother    Anxiety disorder Brother    Hyperlipidemia Brother    Obesity  Brother    Alcohol abuse Brother    Asthma Sister    Hyperlipidemia Sister    Obesity Sister    Colon cancer Neg Hx    Colon polyps Neg Hx    Esophageal cancer Neg Hx    Rectal cancer Neg Hx    Stomach cancer Neg Hx     Social History   Socioeconomic History   Marital status: Married    Spouse name: [name]   Number of children: 1   Years of education: Not on file   Highest education level: Bachelor's degree (e.g., BA, AB, BS)  Occupational History   Occupation: insurance adj retired  Tobacco Use   Smoking status: Former    Current packs/day: 0.00    Average  packs/day: 1 pack/day for 20.0 years (20.0 ttl pk-yrs)    Types: Cigarettes    Start date: 01/06/1983    Quit date: 01/06/2003    Years since quitting: 20.4    Passive exposure: Past   Smokeless tobacco: Former    Types: Chew    Quit date: 01/06/2004   Tobacco comments:    Also used chewing tobacco formerly  Vaping Use   Vaping status: Never Used  Substance and Sexual Activity   Alcohol use: Not Currently    Comment: Totally quit 2005   Drug use: No   Sexual activity: Yes    Partners: Female    Birth control/protection: None    Comment: Wife had tubal ligation  Other Topics Concern   Not on file  Social History Narrative   Married, Web designer baseball player   Avid golfer      Are you right handed or left handed? Right Handed    Are you currently employed ? 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       Wife, [name], is a retired Engineer, civil (consulting)        Social Drivers of Corporate investment banker Strain: Low Risk  (02/04/2023)   Overall Financial Resource Strain (CARDIA)    Difficulty of Paying Living Expenses: Not hard at all  Food Insecurity: No Food Insecurity (02/04/2023)   Hunger Vital Sign    Worried About Running Out of Food in the Last Year: Never true    Ran Out of Food in the Last Year: Never true  Transportation Needs: No Transportation Needs (02/04/2023)    PRAPARE - Administrator, Civil Service (Medical): No    Lack of Transportation (Non-Medical): No  Physical Activity: Sufficiently Active (02/04/2023)   Exercise Vital Sign    Days of Exercise per Week: 5 days    Minutes of Exercise per Session: 60 min  Stress: No Stress Concern Present (02/04/2023)   [employer] of Occupational Health - Occupational Stress Questionnaire    Feeling of Stress : Not at all  Social Connections: Socially Integrated (02/04/2023)   Social Connection and Isolation Panel [NHANES]    Frequency of Communication with Friends and Family: More than three times a week    Frequency of Social Gatherings with Friends and Family: More than three times a week    Attends Religious Services: More than 4 times per year    Active Member of [employer] or Organizations: Yes    Attends Engineer, structural: More than 4 times per year    Marital Status: Married     Review of Systems: A 12 point ROS discussed and pertinent positives are indicated in the HPI above.  All other systems are negative.  Vital Signs: BP (!) 146/92 (BP Location: Right Arm)   Pulse 60   Temp 98 F (36.7 C) (Oral)   Resp 18   Ht 5' 9.5" (1.765 m)   Wt 232 lb 14.4 oz (105.6 kg)   SpO2 95%   BMI 33.90 kg/m   Advance Care Plan: The advanced care place/surrogate decision maker was discussed at the time of visit and the patient did not wish to discuss or was not able to name a surrogate decision maker or provide an advance care plan.  Physical Exam Vitals reviewed.  Constitutional:      General: He is not in acute distress.    Appearance: Normal appearance.  HENT:  Mouth/Throat:     Mouth: Mucous membranes are dry.  Cardiovascular:     Rate and Rhythm: Normal rate and regular rhythm.     Pulses: Normal pulses.     Heart sounds: No murmur heard. Pulmonary:     Effort: Pulmonary effort is normal. No respiratory distress.     Breath sounds: Normal breath sounds.   Abdominal:     General: Abdomen is flat.     Tenderness: There is abdominal tenderness.     Comments: Mild tenderness to palpation overlying sigmoid colon.  Musculoskeletal:        General: Normal range of motion.     Cervical back: Normal range of motion.  Skin:    General: Skin is warm and dry.  Neurological:     Mental Status: He is alert and oriented to person, place, and time.  Psychiatric:        Mood and Affect: Mood normal.        Behavior: Behavior normal.        Thought Content: Thought content normal.        Judgment: Judgment normal.     Imaging: US  ABDOMEN LIMITED RUQ (LIVER/GB) Result Date: 04/29/2023 CLINICAL DATA:  eval for cirrhosis, fatty liver EXAM: ULTRASOUND ABDOMEN LIMITED RIGHT UPPER QUADRANT COMPARISON:  October 30, 2022. FINDINGS: Gallbladder: No gallstones or wall thickening visualized. Comet tail artifact is noted along the gallbladder wall. No sonographic Murphy sign noted by sonographer. Common bile duct: Diameter: Visualized portion measures 3 mm, within normal limits. Liver: Benign hepatic cyst is noted in the LEFT liver measuring up to 13 mm. Mildly increased in parenchymal echogenicity. Portal vein is patent on color Doppler imaging with normal direction of blood flow towards the liver. Other: None. IMPRESSION: 1. Mildly increased hepatic parenchymal echogenicity as can be seen in hepatic steatosis or underlying hepatocellular disease. 2. Adenomyomatosis of the gallbladder. Electronically Signed   By: Clancy Crimes M.D.   On: 04/29/2023 16:29    Labs:  CBC: Recent Labs    05/30/22 0312 10/19/22 1222 04/12/23 0734  WBC 7.7 5.6 5.9  HGB 11.7* 13.3 14.1  HCT 35.6* 40.3 41.8  PLT 177 202 216.0    COAGS: Recent Labs    05/19/23 1155  INR 1.2*    BMP: Recent Labs    05/30/22 0312 10/19/22 1222 04/12/23 0734  NA 136 139 139  K 3.8 4.3 4.2  CL 101 107 102  CO2 28 26 30   GLUCOSE 189* 104* 141*  BUN 17 17 20   CALCIUM  8.5* 9.1 9.1   CREATININE 0.88 0.90 0.68  GFRNONAA >60 >60  --     LIVER FUNCTION TESTS: Recent Labs    04/12/23 0734 04/22/23 1018 05/11/23 1058 05/19/23 1155  BILITOT 1.0 0.9 0.7 0.9  AST 52* 38* 121* 85*  ALT 109* 77* 327* 239*  ALKPHOS 226* 162* 106 89  PROT 6.9 7.1 6.8 7.1  ALBUMIN 4.2 4.6 4.1 4.2    TUMOR MARKERS: No results for input(s): "AFPTM", "CEA", "CA199", "CHROMGRNA" in the last 8760 hours.  Assessment and Plan: Patient with chronic elevated enzyme labs. Dr. Bridgett Camps has requested MR Liver w/ & w/o CM, which is pending on 5/27. Patient has also consented to random liver biopsy. He presents for scheduled liver biopsy in IR today.  Patient has been NPO since midnight.  All labs and medications are within acceptable parameters.  No pertinent allergies.   Risks and benefits of random liver biopsy was discussed with the  patient and/or patient's family including, but not limited to bleeding, infection, damage to adjacent structures or low yield requiring additional tests.  All of the questions were answered and there is agreement to proceed.  Consent signed and in chart.      Thank you for allowing our service to participate in Charles [name] 's care.  Electronically Signed: Lovena Rubinstein, PA-C   05/28/2023, 12:16 PM      I spent a total of 30 Minutes in face to face in clinical consultation, greater than 50% of which was counseling/coordinating care for elevated liver function tests, with consideration for random liver biopsy.

## 2023-05-28 ENCOUNTER — Other Ambulatory Visit: Payer: Self-pay

## 2023-05-28 ENCOUNTER — Ambulatory Visit (HOSPITAL_COMMUNITY)
Admission: RE | Admit: 2023-05-28 | Discharge: 2023-05-28 | Disposition: A | Discharge status: Home / Self Care | Source: Ambulatory Visit | Attending: Internal Medicine | Admitting: Internal Medicine

## 2023-05-28 ENCOUNTER — Encounter (HOSPITAL_COMMUNITY): Payer: Self-pay

## 2023-05-28 VITALS — BP 107/70 | HR 60 | Temp 97.6°F | Resp 16 | Ht 69.5 in | Wt 232.9 lb

## 2023-05-28 DIAGNOSIS — R7401 Elevation of levels of liver transaminase levels: Secondary | ICD-10-CM | POA: Diagnosis not present

## 2023-05-28 DIAGNOSIS — F411 Generalized anxiety disorder: Secondary | ICD-10-CM | POA: Diagnosis not present

## 2023-05-28 DIAGNOSIS — R7989 Other specified abnormal findings of blood chemistry: Secondary | ICD-10-CM

## 2023-05-28 DIAGNOSIS — Z8572 Personal history of non-Hodgkin lymphomas: Secondary | ICD-10-CM | POA: Insufficient documentation

## 2023-05-28 DIAGNOSIS — Z01818 Encounter for other preprocedural examination: Secondary | ICD-10-CM | POA: Diagnosis not present

## 2023-05-28 DIAGNOSIS — E1140 Type 2 diabetes mellitus with diabetic neuropathy, unspecified: Secondary | ICD-10-CM | POA: Diagnosis not present

## 2023-05-28 DIAGNOSIS — K219 Gastro-esophageal reflux disease without esophagitis: Secondary | ICD-10-CM | POA: Diagnosis not present

## 2023-05-28 DIAGNOSIS — Z8546 Personal history of malignant neoplasm of prostate: Secondary | ICD-10-CM | POA: Diagnosis not present

## 2023-05-28 DIAGNOSIS — E785 Hyperlipidemia, unspecified: Secondary | ICD-10-CM | POA: Diagnosis not present

## 2023-05-28 DIAGNOSIS — R748 Abnormal levels of other serum enzymes: Secondary | ICD-10-CM | POA: Diagnosis not present

## 2023-05-28 DIAGNOSIS — C884 Extranodal marginal zone b-cell lymphoma of mucosa-associated lymphoid tissue (malt-lymphoma) not having achieved remission: Secondary | ICD-10-CM

## 2023-05-28 DIAGNOSIS — K760 Fatty (change of) liver, not elsewhere classified: Secondary | ICD-10-CM | POA: Diagnosis not present

## 2023-05-28 DIAGNOSIS — K7581 Nonalcoholic steatohepatitis (NASH): Secondary | ICD-10-CM | POA: Diagnosis not present

## 2023-05-28 DIAGNOSIS — Z7984 Long term (current) use of oral hypoglycemic drugs: Secondary | ICD-10-CM | POA: Insufficient documentation

## 2023-05-28 DIAGNOSIS — I10 Essential (primary) hypertension: Secondary | ICD-10-CM | POA: Diagnosis not present

## 2023-05-28 LAB — CBC
HCT: 42 % (ref 39.0–52.0)
Hemoglobin: 14 g/dL (ref 13.0–17.0)
MCH: 31 pg (ref 26.0–34.0)
MCHC: 33.3 g/dL (ref 30.0–36.0)
MCV: 93.1 fL (ref 80.0–100.0)
Platelets: 211 10*3/uL (ref 150–400)
RBC: 4.51 MIL/uL (ref 4.22–5.81)
RDW: 13 % (ref 11.5–15.5)
WBC: 5.4 10*3/uL (ref 4.0–10.5)
nRBC: 0 % (ref 0.0–0.2)

## 2023-05-28 LAB — BASIC METABOLIC PANEL WITH GFR
Anion gap: 8 (ref 5–15)
BUN: 11 mg/dL (ref 8–23)
CO2: 27 mmol/L (ref 22–32)
Calcium: 9.2 mg/dL (ref 8.9–10.3)
Chloride: 102 mmol/L (ref 98–111)
Creatinine, Ser: 0.81 mg/dL (ref 0.61–1.24)
GFR, Estimated: >60 mL/min (ref >60)
Glucose, Bld: 114 mg/dL — ABNORMAL HIGH (ref 70–99)
Potassium: 3.8 mmol/L (ref 3.5–5.1)
Sodium: 137 mmol/L (ref 135–145)

## 2023-05-28 LAB — PROTIME-INR
INR: 1.1 (ref 0.8–1.2)
Prothrombin Time: 14.7 s (ref 11.4–15.2)

## 2023-05-28 MED ORDER — GELATIN ABSORBABLE 12-7 MM EX MISC
CUTANEOUS | Status: AC
  Filled 2023-05-28: qty 1

## 2023-05-28 MED ORDER — FENTANYL CITRATE (PF) 100 MCG/2ML IJ SOLN
INTRAMUSCULAR | Status: AC | PRN
  Administered 2023-05-28 (×2): 50 ug via INTRAVENOUS

## 2023-05-28 MED ORDER — SODIUM CHLORIDE 0.9 % IV SOLN: INTRAVENOUS | Status: DC

## 2023-05-28 MED ORDER — HYDROCODONE-ACETAMINOPHEN 5-325 MG PO TABS: 1.0000 | ORAL_TABLET | ORAL | Status: DC | PRN

## 2023-05-28 MED ORDER — FENTANYL CITRATE (PF) 100 MCG/2ML IJ SOLN
INTRAMUSCULAR | Status: AC
  Filled 2023-05-28: qty 2

## 2023-05-28 MED ORDER — MIDAZOLAM HCL 2 MG/2ML IJ SOLN
INTRAMUSCULAR | Status: AC
  Filled 2023-05-28: qty 4

## 2023-05-28 MED ORDER — MIDAZOLAM HCL 2 MG/2ML IJ SOLN
INTRAMUSCULAR | Status: AC | PRN
  Administered 2023-05-28 (×3): 1 mg via INTRAVENOUS

## 2023-05-28 MED ORDER — LIDOCAINE HCL 1 % IJ SOLN
INTRAMUSCULAR | Status: AC
  Filled 2023-05-28: qty 20

## 2023-05-28 NOTE — Discharge Instructions (Signed)
Please call Interventional Radiology clinic [phone] with any questions or concerns.  You may remove your dressing and shower tomorrow.  After the procedure, it is common to have: Soreness Bruising Mild pain You may also feel tired for a few days  Follow these instructions at home:  Medication: Do not use Aspirin or ibuprofen products, such as Advil or Motrin, as it may increase bleeding.  You may resume your usual medications as ordered by your doctor If your doctor prescribed antibiotics, take them as directed. Do not stop taking them just because you feel better. You need to take the full course of antibiotics.  Eating and drinking: Drink plenty of liquids to keep your urine pale yellow You can resume your regular diet as directed by your doctor  Care of the procedure site Follow instructions from your doctor about how to take care of your cut from surgery (incisions). Make sure you: Wash your hands with soap and water for at least 20 seconds before and after you change your bandage. If you cannot use soap and water, use hand sanitizer Change your bandage Leave stitches or skin glue in place for at least two weeks Leave tape strips alone unless you are told to take them off. You may trim the edges of the tape strips if they curl up. Check your incision every day for signs of infection. Check for: Redness, swelling, or more pain Fluid or blood Warmth Pus or a bad smell  Activity Rest at home for 1-2 days, or as told by your doctor Get up to take short walks every 1 to 2 hours. Ask for help if you feel weak or unsteady Do not lift anything that is heavier than 10 lb (4.5 kg), or the limit that you are told Do not play contact sports for 2 weeks after the procedure Return to your normal activities as told by your doctor Do not take baths, swim, or use a hot tub until your health care provider approves. Take showers only. Keep all follow-up visits as told by your  doctor  Contact a doctor if: You have more bleeding in your incision Your incision swells, or is red and more painful You have fluid that comes from your incision You develop a rash You have fever or chills  Get help right away if: You have swelling, bloating, or pain in your belly (abdomen) You get dizzy or faint You vomit or you feel like vomiting You have trouble breathing or feel short of breath You have chest pain You have problems talking or seeing You have trouble with your balance or moving your arms or legs  Moderate Conscious Sedation-Care After  This sheet gives you information about how to care for yourself after your procedure. Your health care provider may also give you more specific instructions. If you have problems or questions, contact your health care provider.  After the procedure, it is common to have: Sleepiness for several hours. Impaired judgment for several hours. Difficulty with balance. Vomiting if you eat too soon.  Follow these instructions at home:  Rest. Do not participate in activities where you could fall or become injured. Do not drive or use machinery. Do not drink alcohol. Do not take sleeping pills or medicines that cause drowsiness. Do not make important decisions or sign legal documents. Do not take care of children on your own.  Eating and drinking Follow the diet recommended by your health care provider. Drink enough fluid to keep your urine pale yellow.  If you vomit: Drink water, juice, or soup when you can drink without vomiting. Make sure you have little or no nausea before eating solid foods.  General instructions Take over-the-counter and prescription medicines only as told by your health care provider. Have a responsible adult stay with you for the time you are told. It is important to have someone help care for you until you are awake and alert. Do not smoke. Keep all follow-up visits as told by your health care  provider. This is important.  Contact a health care provider if: You are still sleepy or having trouble with balance after 24 hours. You feel light-headed. You keep feeling nauseous or you keep vomiting. You develop a rash. You have a fever. You have redness or swelling around the IV site.  Get help right away if: You have trouble breathing. You have new-onset confusion at home.  This information is not intended to replace advice given to you by your health care provider. Make sure you discuss any questions you have with your healthcare provider.

## 2023-05-28 NOTE — Procedures (Signed)
 Interventional Radiology Procedure:   Indications: Elevated liver enzymes  Procedure: US  guided liver biopsy  Findings: 3 cores from right hepatic lobe, gelfoam slurry injected along biopsy tract.  Complications: None     EBL: Minimal  Plan: Bedrest 2 hours  Charles [name] R. Julietta Ogren, MD  Pager: [phone]

## 2023-06-01 ENCOUNTER — Ambulatory Visit (HOSPITAL_COMMUNITY)
Admission: RE | Admit: 2023-06-01 | Discharge: 2023-06-01 | Disposition: A | Discharge status: Home / Self Care | Source: Ambulatory Visit | Attending: Internal Medicine | Admitting: Internal Medicine

## 2023-06-01 DIAGNOSIS — C884 Extranodal marginal zone b-cell lymphoma of mucosa-associated lymphoid tissue (malt-lymphoma) not having achieved remission: Secondary | ICD-10-CM | POA: Diagnosis not present

## 2023-06-01 DIAGNOSIS — I700 Atherosclerosis of aorta: Secondary | ICD-10-CM | POA: Diagnosis not present

## 2023-06-01 DIAGNOSIS — R160 Hepatomegaly, not elsewhere classified: Secondary | ICD-10-CM | POA: Diagnosis not present

## 2023-06-01 DIAGNOSIS — R7989 Other specified abnormal findings of blood chemistry: Secondary | ICD-10-CM

## 2023-06-01 LAB — GLUCOSE, CAPILLARY: Glucose-Capillary: 103 mg/dL — ABNORMAL HIGH (ref 70–99)

## 2023-06-01 MED ORDER — GADOBUTROL 1 MMOL/ML IV SOLN: 10.0000 mL | Freq: Once | INTRAVENOUS | Status: DC | PRN

## 2023-06-01 MED ORDER — GADOBUTROL 1 MMOL/ML IV SOLN
10.0000 mL | Freq: Once | INTRAVENOUS | Status: AC | PRN
  Administered 2023-06-01: 10 mL via INTRAVENOUS

## 2023-06-02 ENCOUNTER — Other Ambulatory Visit: Payer: Self-pay | Admitting: Neurology

## 2023-06-02 ENCOUNTER — Other Ambulatory Visit: Payer: Self-pay | Admitting: Family Medicine

## 2023-06-02 ENCOUNTER — Ambulatory Visit: Payer: Self-pay | Admitting: Internal Medicine

## 2023-06-02 NOTE — Telephone Encounter (Signed)
 Last office visit 04/22/23 for CPE.  Last refilled  10/12/2022 for #30 with 3 refills. Next appt: 11/04/23 for DM.

## 2023-06-03 LAB — SURGICAL PATHOLOGY

## 2023-06-07 ENCOUNTER — Ambulatory Visit: Payer: Self-pay | Admitting: Internal Medicine

## 2023-06-07 DIAGNOSIS — R7989 Other specified abnormal findings of blood chemistry: Secondary | ICD-10-CM

## 2023-06-07 NOTE — Progress Notes (Signed)
 Please let patient know that his liver biopsy shows resolving liver injury, nonspecific.  There is no evidence for significant fatty liver disease or autoimmune hepatitis. Autoimmune hepatitis was in the differential with a low level positive smooth muscle antibody; ANA negative and IgG normal  This appears to be liver injury which is resolving There is no significant scarring, fibrosis which is great news  I expect his liver enzymes will normalize and this could have been related to an infectious process or even a medication but based on the biopsy results this should resolve completely without further treatment  Please have him come back for hepatic function panel around 06/20/2023  Thanks JMP

## 2023-06-08 ENCOUNTER — Ambulatory Visit (HOSPITAL_BASED_OUTPATIENT_CLINIC_OR_DEPARTMENT_OTHER): Admitting: Pharmacist Clinician (PhC)/ Clinical Pharmacy Specialist

## 2023-06-08 ENCOUNTER — Encounter (HOSPITAL_BASED_OUTPATIENT_CLINIC_OR_DEPARTMENT_OTHER): Payer: Self-pay

## 2023-06-08 VITALS — BP 112/72 | HR 70 | Ht 70.0 in | Wt 232.0 lb

## 2023-06-08 DIAGNOSIS — E782 Mixed hyperlipidemia: Secondary | ICD-10-CM | POA: Diagnosis not present

## 2023-06-08 DIAGNOSIS — E785 Hyperlipidemia, unspecified: Secondary | ICD-10-CM

## 2023-06-08 NOTE — Progress Notes (Signed)
 Office Visit    Patient Name: Charles [name] Date of Encounter: 06/08/2023  Primary Care Provider:  Scherrie Curt, MD Primary Cardiologist:  Wendie Hamburg, MD  Chief Complaint    Hyperlipidemia   Significant Past Medical History   CAD 1/22 CAC = 165 (62nd percentile)  DM2 4/25 A1c 7.2 on metformin   HTN Controlled on losartan  25     No Known Allergies  History of Present Illness    Charles [name] is a 68 y.o. male patient of Dr Alda Amas, in the office today to discuss options for cholesterol management.  Was on atorvastatin  80 mg daily with LDL at 60, but developed elevated liver enzymes to greater than 3 x UNL.  Will be repeating labs in the next two weeks to be sure enzymes are dropping.  He is in the office today with his wife, a retired Engineer, civil (consulting).    Insurance Carrier:  [employer] [id number]  25% copay= $145/month   LDL Cholesterol goal:  LDL < 55  Current Medications:   none - stopped atorvastatin  about 3-4 weeks ago  Family Hx:   father had MI at 20, stroke later and died at 69; ,mother had cancer, htn; sister and one brother with high cholesterol,  no biological kids  Social Hx: Tobacco: no Alcohol: no  Diet:  more home cooked meals; variety of proteins, not as much red meat, more chicken and [place]; vegetables fresh or frozen; snacking  - occasional chips, popcorn, protien bars, trail mix     Exercise: golf 3 days per week (18 holes) no cart, walks at home several days per week   Accessory Clinical Findings   Lab Results  Component Value Date   CHOL 128 04/12/2023   HDL 50.10 04/12/2023   LDLCALC 64 04/12/2023   LDLDIRECT 141.0 10/21/2016   TRIG 73.0 04/12/2023   CHOLHDL 3 04/12/2023    No results found for: "LIPOA"  Lab Results  Component Value Date   ALT 239 (H) 05/19/2023   AST 85 (H) 05/19/2023   ALKPHOS 89 05/19/2023   BILITOT 0.9 05/19/2023   Lab Results  Component Value Date   CREATININE 0.81 05/28/2023   BUN 11 05/28/2023    NA 137 05/28/2023   K 3.8 05/28/2023   CL 102 05/28/2023   CO2 27 05/28/2023   Lab Results  Component Value Date   HGBA1C 7.2 (H) 04/12/2023    Home Medications    Current Outpatient Medications  Medication Sig Dispense Refill   Alcohol Swabs (B-D SINGLE USE SWABS REGULAR) PADS Use to check blood sugar up to 2 times a day 100 each 5   cetirizine  (ZYRTEC ) 10 MG tablet Take 10 mg by mouth daily.     CLINPRO 5000 1.1 % PSTE Place 1 Application onto teeth daily.     clobetasol  ointment (TEMOVATE ) 0.05 % Apply 1 Application topically 2 (two) times daily as needed.     Flaxseed, Linseed, (FLAX SEED OIL PO) Take 1 capsule by mouth daily.     FLUoxetine  (PROZAC ) 20 MG capsule Take 1 capsule (20 mg total) by mouth daily. 90 capsule 1   fluticasone (FLONASE) 50 MCG/ACT nasal spray Place 2 sprays into both nostrils daily.     gabapentin  (NEURONTIN ) 300 MG capsule Take 2 tablet at 4pm and 3 tablets at 9pm. 450 capsule 1   glucose blood test strip SMARTSIG:Via Meter 1-2 Times Daily     Lancets (ONETOUCH DELICA PLUS LANCET30G) MISC USE TO CHECK BLOOD  SUGAR UP TO 2 TIMES A DAY. 100 each 5   LORazepam  (ATIVAN ) 0.5 MG tablet TAKE 1 TABLET (0.5 MG TOTAL) BY MOUTH EVERY 8 (EIGHT) HOURS AS NEEDED. FOR ANXIETY 30 tablet 3   losartan  (COZAAR ) 25 MG tablet TAKE 1 TABLET (25 MG TOTAL) BY MOUTH DAILY. 90 tablet 1   metFORMIN  (GLUCOPHAGE -XR) 500 MG 24 hr tablet TAKE 4 TABLETS BY MOUTH DAILY WITH BREAKFAST. 360 tablet 1   Olopatadine  HCl (PATADAY ) 0.7 % SOLN Place 1 drop into both eyes daily.     pantoprazole  (PROTONIX ) 40 MG tablet Take 1 tablet (40 mg total) by mouth daily. 90 tablet 3   Polyethyl Glycol-Propyl Glycol (SYSTANE OP) Place 1 drop into both eyes as needed (dry eyes).     sildenafil  (VIAGRA ) 100 MG tablet Take 0.5-1 tablets (50-100 mg total) by mouth daily as needed for erectile dysfunction. 5 tablet 11   acetaminophen  (TYLENOL ) 500 MG tablet Take 1,000 mg by mouth every 6 (six) hours as needed  for moderate pain. (Patient not taking: Reported on 06/08/2023)     aspirin  EC 81 MG tablet Take 81 mg by mouth daily. Swallow whole.     Multiple Vitamins-Minerals (MULTIVITAMIN WITH MINERALS) tablet Take 1 tablet by mouth daily.     No current facility-administered medications for this visit.     Assessment & Plan    Mixed hyperlipidemia Assessment: Patient with ASCVD not at LDL goal of < 55 Most recent LDL 64 on 04/12/2023 (still on atorvastatin ) Not able to tolerate statins secondary elevated liver enzymes (to > 3 x UNL) Reviewed options for lowering LDL cholesterol, including ezetimibe, PCSK-9 inhibitors, bempedoic acid and inclisiran.  Discussed mechanisms of action, dosing, side effects, potential decreases in LDL cholesterol and costs.  Also reviewed potential options for patient assistance.  Plan: Patient agreeable to starting Leqvio Will need to get baseline labs in 2-3 weeks, when off statin > 1 month Repeat labs 1 month after second Leqvio dose Lipid Liver function   Donivan Furry, PharmD CPP [facility] [address] [place], [place] [address] [phone]  06/08/2023, 1:59 PM

## 2023-06-08 NOTE — Assessment & Plan Note (Signed)
 Assessment: Patient with ASCVD not at LDL goal of < 55 Most recent LDL 64 on 04/12/2023 (still on atorvastatin ) Not able to tolerate statins secondary elevated liver enzymes (to > 3 x UNL) Reviewed options for lowering LDL cholesterol, including ezetimibe, PCSK-9 inhibitors, bempedoic acid and inclisiran.  Discussed mechanisms of action, dosing, side effects, potential decreases in LDL cholesterol and costs.  Also reviewed potential options for patient assistance.  Plan: Patient agreeable to starting Leqvio Will need to get baseline labs in 2-3 weeks, when off statin > 1 month Repeat labs 1 month after second Leqvio dose Lipid Liver function

## 2023-06-08 NOTE — Patient Instructions (Signed)
 Your Results:             Your most recent labs Goal  Total Cholesterol 128 < 200  Triglycerides 73 < 150  HDL (happy/good cholesterol) 50.1 > 40  LDL (lousy/bad cholesterol 64 < 55   Medication changes:  We will start the process to get Leqvio covered by your insurance.  Once the prior authorization is complete, you will get a call from the [facility] to set a date for your injections  Lab orders:  Get baseline labs in the next 2 weeks when you go back for liver function labs  We want to repeat labs about a month after the second dose.  We will send you a lab order to remind you once we get closer to that time.       Thank you for choosing [facility]

## 2023-06-09 ENCOUNTER — Ambulatory Visit: Admitting: Nurse Practitioner

## 2023-06-17 ENCOUNTER — Other Ambulatory Visit: Payer: Self-pay

## 2023-06-17 ENCOUNTER — Other Ambulatory Visit

## 2023-06-17 DIAGNOSIS — R7989 Other specified abnormal findings of blood chemistry: Secondary | ICD-10-CM | POA: Diagnosis not present

## 2023-06-17 LAB — HEPATIC FUNCTION PANEL
AG Ratio: 1.4 (calc) (ref 1.0–2.5)
ALT: 112 U/L — ABNORMAL HIGH (ref 9–46)
AST: 51 U/L — ABNORMAL HIGH (ref 10–35)
Albumin: 4.1 g/dL (ref 3.6–5.1)
Alkaline phosphatase (APISO): 82 U/L (ref 35–144)
Bilirubin, Direct: 0.2 mg/dL (ref 0.0–0.2)
Globulin: 2.9 g/dL (ref 1.9–3.7)
Indirect Bilirubin: 0.5 mg/dL (ref 0.2–1.2)
Total Bilirubin: 0.7 mg/dL (ref 0.2–1.2)
Total Protein: 7 g/dL (ref 6.1–8.1)

## 2023-06-17 LAB — LIPID PANEL
Cholesterol: 256 mg/dL — ABNORMAL HIGH (ref <200)
HDL: 51 mg/dL (ref >=40)
LDL Cholesterol (Calc): 184 mg/dL — ABNORMAL HIGH
Non-HDL Cholesterol (Calc): 205 mg/dL — ABNORMAL HIGH (ref <130)
Total CHOL/HDL Ratio: 5 (calc) — ABNORMAL HIGH (ref <5.0)
Triglycerides: 92 mg/dL (ref <150)

## 2023-06-18 ENCOUNTER — Other Ambulatory Visit: Payer: Self-pay

## 2023-06-18 ENCOUNTER — Ambulatory Visit: Payer: Self-pay | Admitting: Internal Medicine

## 2023-06-18 DIAGNOSIS — R7989 Other specified abnormal findings of blood chemistry: Secondary | ICD-10-CM

## 2023-06-19 ENCOUNTER — Encounter: Payer: Self-pay | Admitting: Pharmacist Clinician (PhC)/ Clinical Pharmacy Specialist

## 2023-06-28 ENCOUNTER — Other Ambulatory Visit: Payer: Self-pay | Admitting: Pharmacist Clinician (PhC)/ Clinical Pharmacy Specialist

## 2023-06-28 NOTE — Progress Notes (Signed)
Leqvio ordered.

## 2023-07-01 ENCOUNTER — Telehealth: Payer: Self-pay

## 2023-07-01 NOTE — Telephone Encounter (Signed)
 Dr. Kate, patient will be scheduled as soon as possible.  Auth Submission: APPROVED Site of care: Site of care: [facility] Payer: [employer] medicare Medication & CPT/J Code(s) submitted: Leqvio (Inclisiran) [id number] Diagnosis Code:  Route of submission (phone, fax, portal): portal Phone # Fax # Auth type: Buy/Bill PB Units/visits requested: 284mg  x 3 doses Reference number: [id number] Approval from: 06/28/23 to 06/27/24

## 2023-07-06 ENCOUNTER — Ambulatory Visit

## 2023-07-06 VITALS — BP 134/71 | HR 63 | Temp 98.1°F | Resp 16 | Ht 70.0 in | Wt 231.0 lb

## 2023-07-06 DIAGNOSIS — E782 Mixed hyperlipidemia: Secondary | ICD-10-CM | POA: Diagnosis not present

## 2023-07-06 MED ORDER — INCLISIRAN SODIUM 284 MG/1.5ML ~~LOC~~ SOSY
284.0000 mg | PREFILLED_SYRINGE | Freq: Once | SUBCUTANEOUS | Status: AC
  Administered 2023-07-06: 284 mg via SUBCUTANEOUS
  Filled 2023-07-06: qty 1.5

## 2023-07-06 NOTE — Progress Notes (Signed)
 Diagnosis: Hyperlipidemia  Provider:  Mannam, Praveen MD  Procedure: Injection  Leqvio (inclisiran), Dose: 284 mg, Site: subcutaneous, Number of injections: 1  Injection Site(s): Right arm  Post Care: Observation period completed  Discharge: Condition: Good, Destination: Home . AVS Declined  Performed by:  Maximiano JONELLE Pouch, LPN

## 2023-07-13 ENCOUNTER — Other Ambulatory Visit: Payer: Self-pay

## 2023-07-13 DIAGNOSIS — C61 Malignant neoplasm of prostate: Secondary | ICD-10-CM | POA: Diagnosis not present

## 2023-07-14 LAB — PSA: Prostate Specific Ag, Serum: 1.6 ng/mL (ref 0.0–4.0)

## 2023-07-15 ENCOUNTER — Ambulatory Visit: Payer: Self-pay | Admitting: Urology

## 2023-07-15 VITALS — BP 146/80 | HR 75 | Ht 70.0 in | Wt 225.0 lb

## 2023-07-15 DIAGNOSIS — C61 Malignant neoplasm of prostate: Secondary | ICD-10-CM | POA: Diagnosis not present

## 2023-07-15 NOTE — Progress Notes (Signed)
   04/16/2021 12:49 PM    Charles [name] Lunger Mar 24, 1955 [id number]   Reason for visit: Low risk prostate cancer   HPI: 68 year old male diagnosed with B-cell lymphoma on colonoscopy and underwent a PET/CT for staging imaging.  This showed no abnormalities aside from a focal area of intake in the left hemiprostate.  PSA was 1.8 and he opted for prostate biopsy. Biopsy on 10/02/2020 showed a 46 g prostate with a PSA density of 0.04, no abnormalities seen on transrectal ultrasound.  Biopsies showed only 1/12 cores positive for Gleason score 3+3= 6 prostate adenocarcinoma with maximal involvement of 10%, that correlated with the location of the abnormality on PET/CT in the left side.He opted for active surveillance with his low risk disease, normal PSA, and only single core showing low risk disease. He  also had a prostate MRI in March 2023 that showed a 45 g prostate with no suspicious areas that correlated with the PET/CT findings, and a PI-RADS 3 lesion in the right posterior peripheral zone that was very subtle.  PSA remains stable and low, most recently 1.6 from July 2025.  He also had a repeat PET scan in October 2024 for his history of lymphoma, prostate was normal at that time with no worrisome findings.  He denies any significant urinary symptoms.  He would like to continue active surveillance which is very reasonable.   RTC 1 year PSA prior, continue active surveillance  Redell JAYSON Burnet, MD       [facility] [address] [place], [place] [address] [phone]

## 2023-07-20 ENCOUNTER — Encounter: Payer: Self-pay | Admitting: Family Medicine

## 2023-07-20 DIAGNOSIS — Z79899 Other long term (current) drug therapy: Secondary | ICD-10-CM

## 2023-07-20 DIAGNOSIS — E782 Mixed hyperlipidemia: Secondary | ICD-10-CM

## 2023-07-26 ENCOUNTER — Encounter: Payer: Self-pay | Admitting: Pharmacist

## 2023-07-26 NOTE — Progress Notes (Signed)
 Pharmacy Quality Measure Review  This patient is appearing on a report for being at risk of failing the adherence measure for cholesterol (statin) medications this calendar year.   Medication: Atorvastatin  Last fill date: 02/20/2023 for 90 day supply  Medication held in the setting of elevated LFTs. Patient will not pass adherence measure, no further action needed.

## 2023-07-31 ENCOUNTER — Other Ambulatory Visit: Payer: Self-pay | Admitting: Family Medicine

## 2023-08-13 ENCOUNTER — Encounter: Payer: Self-pay | Admitting: *Deleted

## 2023-08-13 DIAGNOSIS — R7989 Other specified abnormal findings of blood chemistry: Secondary | ICD-10-CM

## 2023-08-17 ENCOUNTER — Other Ambulatory Visit (INDEPENDENT_AMBULATORY_CARE_PROVIDER_SITE_OTHER)

## 2023-08-17 ENCOUNTER — Ambulatory Visit: Payer: Self-pay | Admitting: Family Medicine

## 2023-08-17 DIAGNOSIS — E782 Mixed hyperlipidemia: Secondary | ICD-10-CM | POA: Diagnosis not present

## 2023-08-17 DIAGNOSIS — Z79899 Other long term (current) drug therapy: Secondary | ICD-10-CM | POA: Diagnosis not present

## 2023-08-17 LAB — HEPATIC FUNCTION PANEL
ALT: 72 U/L — ABNORMAL HIGH (ref 0–53)
AST: 41 U/L — ABNORMAL HIGH (ref 0–37)
Albumin: 4 g/dL (ref 3.5–5.2)
Alkaline Phosphatase: 74 U/L (ref 39–117)
Bilirubin, Direct: 0.1 mg/dL (ref 0.0–0.3)
Total Bilirubin: 0.7 mg/dL (ref 0.2–1.2)
Total Protein: 6.8 g/dL (ref 6.0–8.3)

## 2023-08-17 LAB — LIPID PANEL
Cholesterol: 136 mg/dL (ref 0–200)
HDL: 48.3 mg/dL (ref >39.00)
LDL Cholesterol: 75 mg/dL (ref 0–99)
NonHDL: 87.57
Total CHOL/HDL Ratio: 3
Triglycerides: 63 mg/dL (ref 0.0–149.0)
VLDL: 12.6 mg/dL (ref 0.0–40.0)

## 2023-08-25 ENCOUNTER — Encounter (HOSPITAL_BASED_OUTPATIENT_CLINIC_OR_DEPARTMENT_OTHER): Payer: Self-pay | Admitting: Cardiology

## 2023-08-25 DIAGNOSIS — Z5181 Encounter for therapeutic drug level monitoring: Secondary | ICD-10-CM

## 2023-08-25 DIAGNOSIS — I10 Essential (primary) hypertension: Secondary | ICD-10-CM

## 2023-08-25 DIAGNOSIS — E785 Hyperlipidemia, unspecified: Secondary | ICD-10-CM

## 2023-08-26 NOTE — Telephone Encounter (Signed)
 Liver numbers are improving, cholesterol looks good

## 2023-09-08 NOTE — Telephone Encounter (Signed)
 After liver biopsy his low level AST and ALT liver inflammation are noted but not overtly concerning at this time Lets have him repeat hepatic function panel and GGT in 3 months Poppi Scantling M. Jasa Dundon, M.D.  09/08/2023

## 2023-09-09 ENCOUNTER — Encounter: Payer: Self-pay | Admitting: Cardiology

## 2023-10-01 ENCOUNTER — Other Ambulatory Visit: Payer: Self-pay | Admitting: Neurology

## 2023-10-07 ENCOUNTER — Ambulatory Visit

## 2023-10-07 VITALS — BP 147/83 | HR 63 | Temp 98.1°F | Resp 20 | Ht 70.0 in | Wt 221.0 lb

## 2023-10-07 DIAGNOSIS — E782 Mixed hyperlipidemia: Secondary | ICD-10-CM

## 2023-10-07 MED ORDER — INCLISIRAN SODIUM 284 MG/1.5ML ~~LOC~~ SOSY
284.0000 mg | PREFILLED_SYRINGE | Freq: Once | SUBCUTANEOUS | Status: AC
  Administered 2023-10-07: 284 mg via SUBCUTANEOUS
  Filled 2023-10-07: qty 1.5

## 2023-10-07 NOTE — Progress Notes (Signed)
 Diagnosis: Hyperlipidemia  Provider:  Mannam, Praveen MD  Procedure: Injection  Leqvio  (inclisiran), Dose: 284 mg, Site: subcutaneous, Number of injections: 1  Injection Site(s): Left arm  Post Care: Patient declined observation  Discharge: Condition: Good, Destination: Home . AVS Declined  Performed by:  Mathew Therisa NOVAK, RN

## 2023-10-07 NOTE — Progress Notes (Deleted)
 Diagnosis: Asthma  Provider:  Layman Pizza NP  Procedure: Injection  Xolair (Omalizumab), Dose: 300 mg, Site: subcutaneous, Number of injections: 2  Injection Site(s): Left arm and right arm  Post Care: Patient declined observation  Discharge: Condition: Good, Destination: Home . AVS Declined  Performed by:  Mathew Therisa NOVAK, RN

## 2023-10-14 NOTE — Progress Notes (Signed)
 Charles [name]                                          MRN: [id number]   10/14/2023   The [facility] Quality Team Specialist reviewed this patient medical record for the purposes of chart review for care gap closure. The following were reviewed: chart review for care gap closure-controlling blood pressure.    [facility] Quality Team

## 2023-10-31 ENCOUNTER — Other Ambulatory Visit: Payer: Self-pay | Admitting: Family Medicine

## 2023-11-02 NOTE — Progress Notes (Unsigned)
 Charles Aronoff T. Charles Lampkins, MD, CAQ Sports Medicine [facility] at [facility] [address] [place], [address]  Phone: [phone]  FAX: [phone]  Charles [name] - 68 y.o. male  MRN [id number]  Date of Birth: [birth year]  Date: 11/04/2023  PCP: Watt Mirza, MD  Referral: Watt Mirza, MD  No chief complaint on file.  Subjective:   Charles [name] is a 68 y.o. very pleasant male patient with There is no height or weight on file to calculate BMI. who presents with the following:  Discussed the use of AI scribe software for clinical note transcription with the patient, who gave verbal consent to proceed.  Charles [name] is here for 51-month follow-up, and I last saw him for his physical in April 2025.  Follow-up diabetes, hypertension, elevated LFTs.  Consistently elevated, prior liver biopsy and MRI of abdomen.  History of Present Illness     Review of Systems is noted in the HPI, as appropriate  Objective:   There were no vitals taken for this visit.  GEN: No acute distress; alert,appropriate. PULM: Breathing comfortably in no respiratory distress PSYCH: Normally interactive.   Laboratory and Imaging Data:  Assessment and Plan:   No diagnosis found. Assessment & Plan   Medication Management during today's office visit: No orders of the defined types were placed in this encounter.  There are no discontinued medications.  Orders placed today for conditions managed today: No orders of the defined types were placed in this encounter.   Disposition: No follow-ups on file.  [employer] One speech-to-text software was used for transcription in this dictation.  Possible transcriptional errors can occur using Animal nutritionist.   Signed,  [name]. [name], MD   Outpatient Encounter Medications as of 11/04/2023  Medication Sig   Alcohol Swabs (B-D SINGLE USE SWABS REGULAR) PADS Use to check blood sugar up to 2 times a day    cetirizine  (ZYRTEC ) 10 MG tablet Take 10 mg by mouth daily.   CLINPRO 5000 1.1 % PSTE Place 1 Application onto teeth daily.   clobetasol  ointment (TEMOVATE ) 0.05 % Apply 1 Application topically 2 (two) times daily as needed.   FLUoxetine  (PROZAC ) 20 MG capsule TAKE 1 CAPSULE BY MOUTH EVERY DAY   fluticasone (FLONASE) 50 MCG/ACT nasal spray Place 2 sprays into both nostrils daily.   gabapentin  (NEURONTIN ) 300 MG capsule TAKE 2 TABLET AT 4PM AND 3 TABLETS AT 9PM.   glucose blood test strip SMARTSIG:Via Meter 1-2 Times Daily   inclisiran (LEQVIO ) 284 MG/1.5ML SOSY injection Inject 284 mg into the skin every 6 (six) months.   Lancets (ONETOUCH DELICA PLUS LANCET30G) MISC USE TO CHECK BLOOD SUGAR UP TO 2 TIMES A DAY.   LORazepam  (ATIVAN ) 0.5 MG tablet TAKE 1 TABLET (0.5 MG TOTAL) BY MOUTH EVERY 8 (EIGHT) HOURS AS NEEDED. FOR ANXIETY   losartan  (COZAAR ) 25 MG tablet TAKE 1 TABLET (25 MG TOTAL) BY MOUTH DAILY.   metFORMIN  (GLUCOPHAGE -XR) 500 MG 24 hr tablet TAKE 4 TABLETS BY MOUTH DAILY WITH BREAKFAST.   Olopatadine  HCl (PATADAY ) 0.7 % SOLN Place 1 drop into both eyes daily.   pantoprazole  (PROTONIX ) 40 MG tablet Take 1 tablet (40 mg total) by mouth daily.   Polyethyl Glycol-Propyl Glycol (SYSTANE OP) Place 1 drop into both eyes as needed (dry eyes).   sildenafil  (VIAGRA ) 100 MG tablet Take 0.5-1 tablets (50-100 mg total) by mouth daily as needed for erectile dysfunction.   No facility-administered encounter medications on file  as of 11/04/2023.

## 2023-11-04 ENCOUNTER — Ambulatory Visit: Admitting: Family Medicine

## 2023-11-04 ENCOUNTER — Encounter: Payer: Self-pay | Admitting: Family Medicine

## 2023-11-04 VITALS — BP 138/82 | HR 64 | Temp 97.5°F | Ht 69.5 in | Wt 231.4 lb

## 2023-11-04 DIAGNOSIS — E66811 Obesity, class 1: Secondary | ICD-10-CM

## 2023-11-04 DIAGNOSIS — E119 Type 2 diabetes mellitus without complications: Secondary | ICD-10-CM

## 2023-11-04 DIAGNOSIS — I10 Essential (primary) hypertension: Secondary | ICD-10-CM | POA: Diagnosis not present

## 2023-11-04 DIAGNOSIS — Z6833 Body mass index (BMI) 33.0-33.9, adult: Secondary | ICD-10-CM

## 2023-11-04 LAB — POCT GLYCOSYLATED HEMOGLOBIN (HGB A1C): Hemoglobin A1C: 6.9 % — AB (ref 4.0–5.6)

## 2023-11-08 ENCOUNTER — Encounter: Payer: Self-pay | Admitting: Radiology

## 2023-11-11 ENCOUNTER — Inpatient Hospital Stay: Payer: Medicare HMO | Attending: Hematology and Oncology | Admitting: Hematology and Oncology

## 2023-11-11 ENCOUNTER — Encounter: Payer: Self-pay | Admitting: Hematology and Oncology

## 2023-11-11 VITALS — BP 122/73 | HR 77 | Temp 98.6°F | Resp 18 | Wt 224.6 lb

## 2023-11-11 DIAGNOSIS — C8580 Other specified types of non-Hodgkin lymphoma, unspecified site: Secondary | ICD-10-CM | POA: Diagnosis not present

## 2023-11-11 DIAGNOSIS — C61 Malignant neoplasm of prostate: Secondary | ICD-10-CM | POA: Insufficient documentation

## 2023-11-11 DIAGNOSIS — E119 Type 2 diabetes mellitus without complications: Secondary | ICD-10-CM | POA: Insufficient documentation

## 2023-11-11 DIAGNOSIS — Z8572 Personal history of non-Hodgkin lymphomas: Secondary | ICD-10-CM | POA: Insufficient documentation

## 2023-11-11 NOTE — Assessment & Plan Note (Addendum)
 The patient was nodes with marginal zone lymphoma of the colon, discovered incidentally during colonoscopy in 2022, completely resected PET/CT imaging showed no evidence of disease except abnormal uptake in the left Hemi prostate In 2023, he had repeat colonoscopy and an abnormal polyp revealed disease but he is not symptomatic Again, PET/CT imaging from September 2023 showed no evidence of abnormal uptake He received 4 doses of weekly rituximab , completed by December 2023 Last imaging study in October 2024 showed no evidence of disease He is not symptomatic and has no clinical signs or symptoms of recurrent lymphoma His next colonoscopy would be due in 2027 I will see him back in a year for further follow-up

## 2023-11-11 NOTE — Progress Notes (Signed)
 [facility] OFFICE PROGRESS NOTE  Patient Care Team: Watt Mirza, MD as PCP - General (Family Medicine) Kate Lonni CROME, MD as PCP - Cardiology (Cardiology) Tobie Tonita POUR, DO as Consulting Physician (Neurology)  Assessment & Plan Marginal zone B-cell lymphoma [facility]) The patient was nodes with marginal zone lymphoma of the colon, discovered incidentally during colonoscopy in 2022, completely resected PET/CT imaging showed no evidence of disease except abnormal uptake in the left Hemi prostate In 2023, he had repeat colonoscopy and an abnormal polyp revealed disease but he is not symptomatic Again, PET/CT imaging from September 2023 showed no evidence of abnormal uptake He received 4 doses of weekly rituximab , completed by December 2023 Last imaging study in October 2024 showed no evidence of disease He is not symptomatic and has no clinical signs or symptoms of recurrent lymphoma His next colonoscopy would be due in 2027 I will see him back in a year for further follow-up Prostate cancer [facility]) He will continue close monitoring with urologist with PSA Controlled type 2 diabetes mellitus without complication, without long-term current use of insulin (HCC) We discussed importance of dietary modification, exercise and consideration for aggressive medical management  No orders of the defined types were placed in this encounter.    Almarie Bedford, MD  INTERVAL HISTORY: he returns for surveillance follow-up for marginal zone lymphoma involving his colon polyps He is not symptomatic He had liver biopsy and underwent MRI imaging for elevated liver enzymes, consistent with fatty liver His hemoglobin A1c remain high and he is currently on single agent metformin  for diabetes He has gained weight since our last visit  PHYSICAL EXAMINATION: ECOG PERFORMANCE STATUS: 0 - Asymptomatic  Vitals:   11/11/23 1347  BP: 122/73  Pulse: 77  Resp: 18  Temp: 98.6 F (37 C)   SpO2: 97%   Filed Weights   11/11/23 1347  Weight: 224 lb 9.6 oz (101.9 kg)    Relevant data reviewed during this visit included labs, MRI, liver biopsy results

## 2023-11-11 NOTE — Assessment & Plan Note (Addendum)
 He will continue close monitoring with urologist with PSA

## 2023-11-11 NOTE — Assessment & Plan Note (Addendum)
 We discussed importance of dietary modification, exercise and consideration for aggressive medical management

## 2023-11-16 ENCOUNTER — Ambulatory Visit: Admitting: Neurology

## 2023-11-16 ENCOUNTER — Encounter: Payer: Self-pay | Admitting: Neurology

## 2023-11-16 VITALS — BP 133/80 | HR 65 | Resp 20 | Ht 70.0 in | Wt 221.0 lb

## 2023-11-16 DIAGNOSIS — E1142 Type 2 diabetes mellitus with diabetic polyneuropathy: Secondary | ICD-10-CM

## 2023-11-16 NOTE — Patient Instructions (Signed)
 Take gabapentin  as follows:  2 tablets at 4p and 4 tablets at bedtime

## 2023-11-16 NOTE — Progress Notes (Signed)
 Follow-up Visit   Date: 11/16/2023    PRINCETON NABOR MRN: [id number] DOB: 1955/05/31    Charles [name] is a 68 y.o. right-handed Caucasian male with diet controlled diabetes mellitus, anxiety, GERD, prostate cancer, marginal zone B cell lymphoma, and history of alcohol use  returning to the clinic for follow-up of neuropathy.  The patient was accompanied to the clinic by wife (retired engineer, civil (consulting)) who also provides collateral information.    IMPRESSION/PLAN: Peripheral neuropathy manifesting with painful paresthesia of the feet.  Risk factors:  diabetes, history of alcohol abuse.  Worsening pain over the past few months.    - Take gabapentin  600mg  at 4pm and 1200mg  at bedtime  Return to clinic in 6 months  --------------------------------------------- History of present illness: Starting about 12 years ago, he began having burning, stinging, and tingling in the feet and distal weakness. Symptoms are constant and worse in the evening when he tries to rest.  Nothing has worsened symptoms.  He has tried a foot massage which provides some relief.  He has noticed that his toes are harder to move, because they feel stuck.  Balance is good, although he had one fall today in the shower while trying to practice his golf swing.  He did not hurt himself.     He had diabetes for the past 3 years, which is diet controlled.  For about 25 years, until the mid 2000s, he was drinking excessively, sometimes 6 pack of beer nightly.  He has lost his drivers license on several occasions because of alcohol abuse. He is currently sober. No exposure to chemotherapy  UPDATE 03/16/2022:  He is here for follow-up visit.  He was started on gabapentin  300mg  at the last visit, however he has not noticed any improvement in the burning pain in the feet.  Pain is always worse in the evening.  He denies side effects. No new weakness or imbalance.   He had PET scan today to assess response to chemotherapy for B cell  lymphoma. He is understandably nervous about this.    UPDATE 07/21/2022:  He is here for follow-up visit.  He reports that his feel continue to have burning pain, which is worse when at rest or in the evening.  Unfortunately, he did not appreciate any change when he increased the dose of gabapentin  to 600mg  at bedtime.  He underwent left hip replacement, which has helped with walking. He will be having upcoming sinus surgery.  No new complaints.    UPDATE 05/11/2023:  He is here for follow-up visit.  He felt that adjusting gabapentin  to 600mg  at 4p and 9pm has helped.  It tends to bother him about 3-4 times per week.  Wife says that he is moving his legs less.  No interval falls.    UPDATE 11/16/2023:  He is here for follow-up visit.  He reports that the dose increase with nighttime gabapentin  to 900mg  helped his pain, however over the past 4-6 weeks, the burning in the feet is more bothersome.  He continues to take gabapentin  600mg  at 4p.  He is tolerating gabapentin  well and denies sedation.   Otherwise, he is doing well.  No interval falls or hospitalizations.   Medications:  Current Outpatient Medications on File Prior to Visit  Medication Sig Dispense Refill   Alcohol Swabs (B-D SINGLE USE SWABS REGULAR) PADS Use to check blood sugar up to 2 times a day 100 each 5   aspirin  EC 81 MG tablet Take 81  mg by mouth daily. Swallow whole.     cetirizine  (ZYRTEC ) 10 MG tablet Take 10 mg by mouth daily.     CLINPRO 5000 1.1 % PSTE Place 1 Application onto teeth daily.     clobetasol  ointment (TEMOVATE ) 0.05 % Apply 1 Application topically 2 (two) times daily as needed.     FLUoxetine  (PROZAC ) 20 MG capsule TAKE 1 CAPSULE BY MOUTH EVERY DAY 90 capsule 1   fluticasone (FLONASE) 50 MCG/ACT nasal spray Place 2 sprays into both nostrils daily.     gabapentin  (NEURONTIN ) 300 MG capsule TAKE 2 TABLET AT 4PM AND 3 TABLETS AT 9PM. 540 capsule 0   glucose blood test strip SMARTSIG:Via Meter 1-2 Times Daily      inclisiran (LEQVIO ) 284 MG/1.5ML SOSY injection Inject 284 mg into the skin every 6 (six) months.     Lancets (ONETOUCH DELICA PLUS LANCET30G) MISC USE TO CHECK BLOOD SUGAR UP TO 2 TIMES A DAY. 100 each 5   LORazepam  (ATIVAN ) 0.5 MG tablet TAKE 1 TABLET (0.5 MG TOTAL) BY MOUTH EVERY 8 (EIGHT) HOURS AS NEEDED. FOR ANXIETY 30 tablet 3   losartan  (COZAAR ) 25 MG tablet TAKE 1 TABLET (25 MG TOTAL) BY MOUTH DAILY. 90 tablet 3   metFORMIN  (GLUCOPHAGE -XR) 500 MG 24 hr tablet TAKE 4 TABLETS BY MOUTH DAILY WITH BREAKFAST. 360 tablet 1   Olopatadine  HCl (PATADAY ) 0.7 % SOLN Place 1 drop into both eyes daily.     pantoprazole  (PROTONIX ) 40 MG tablet Take 1 tablet (40 mg total) by mouth daily. 90 tablet 3   Polyethyl Glycol-Propyl Glycol (SYSTANE OP) Place 1 drop into both eyes as needed (dry eyes).     sildenafil  (VIAGRA ) 100 MG tablet Take 0.5-1 tablets (50-100 mg total) by mouth daily as needed for erectile dysfunction. 5 tablet 11   No current facility-administered medications on file prior to visit.    Allergies: No Known Allergies  Vital Signs:  BP 133/80   Pulse 65   Resp 20   Ht 5' 10 (1.778 m)   Wt 221 lb (100.2 kg)   BMI 31.71 kg/m    Neurological Exam: MENTAL STATUS including orientation to time, place, person, recent and remote memory, attention span and concentration, language, and fund of knowledge is normal.  Speech is not dysarthric.  CRANIAL NERVES: Pupils equal round and reactive to light.  Normal conjugate, extra-ocular eye movements in all directions of gaze.  No ptosis .  Face is symmetric.   MOTOR:  Motor strength is 5/5 in all extremities, .  No atrophy, fasciculations or abnormal movements.  No pronator drift.  Tone is normal.    MSRs:  Reflexes are 2+/4 throughout.  SENSORY:  Intact to vibration at the ankles.  COORDINATION/GAIT:  Normal finger-to- nose-finger.  Gait narrow based and stable.   Data:  Lab Results  Component Value Date   VITAMINB12 979 (H)  05/12/2023   Lab Results  Component Value Date   HGBA1C 6.9 (A) 11/04/2023     Thank you for allowing me to participate in patient's care.  If I can answer any additional questions, I would be pleased to do so.    Sincerely,    Rian Koon K. Tobie, DO

## 2023-11-17 ENCOUNTER — Encounter: Payer: Self-pay | Admitting: Neurology

## 2023-11-17 MED ORDER — GABAPENTIN 300 MG PO CAPS: ORAL_CAPSULE | ORAL | 3 refills | Status: DC

## 2023-11-29 ENCOUNTER — Other Ambulatory Visit: Payer: Self-pay | Admitting: Family Medicine

## 2023-11-29 NOTE — Progress Notes (Unsigned)
 Cardiology Office Note:    Date:  11/30/2023   ID:  Charles [name], DOB 1955-03-15, MRN [id number]  PCP:  Watt Mirza, MD  Cardiologist:  Lonni LITTIE Nanas, MD  Electrophysiologist:  None   Referring MD: Watt Mirza, MD   Chief Complaint  Patient presents with   Coronary Artery Disease    History of Present Illness:    Charles [name] is a 68 y.o. male with a hx of T2DM, lymphoma who presents for follow-up.  He was referred by Dr. Watt for evaluation of CAD, initially seen on 10/27/2019.  He reports that he has been having intermittent chest pain, occurs a few times per month.  Describes sharp pain in the center of his chest that can last up to 1 minute.  Can occur at rest or with exertion.  Also reports getting short of breath with exertion.  Also has been having lightheadedness when he stands.  Denies any syncope, palpitations, or lower extremity edema.  Golfs 2-3 times per week for exercise, walks the course.  Smoked for 20 years, quit in 2002.  Family history includes father had MI at age 36, CVA in 85s.  Calcium  score 155 (63rd percentile) on 10/03/2019.  Coronary CTA was done on 01/18/2020, which showed calcium  score 165 (62nd percentile), nonobstructive CAD with mixed plaque in mid LAD causing 24 to 49% stenosis, calcified plaque in proximal LAD causing 0 to 24% stenosis, noncalcified plaque in proximal RCA causing 25 to 49% stenosis, calcified plaque in proximal and mid RCA causing 0 to 24% stenosis.  Echocardiogram on 11/03/2019 showed normal biventricular function, no significant valvular disease.  Since last clinic visit, he reports he has been doing well.  Has had 2 doses of Leqvio , reports no issues. Denies any chest pain, dyspnea, lower extremity edema, or palpitations.  Reports some lightheadedness with standing.  Playing golf 2-3 times per week, denies any exertional symptoms.  Wt Readings from Last 3 Encounters:  11/30/23 221 lb (100.2 kg)  11/16/23 221 lb  (100.2 kg)  11/11/23 224 lb 9.6 oz (101.9 kg)     Past Medical History:  Diagnosis Date   Controlled type 2 diabetes mellitus without complication, without long-term current use of insulin (HCC)    Diabetic neuropathy associated with type 2 diabetes mellitus (HCC)    Diverticulitis    Generalized anxiety disorder 10/21/2014   GERD (gastroesophageal reflux disease)    Hyperlipidemia    Hypertension 06/2021   Marginal zone B-cell lymphoma (HCC) 06/10/2020   Prostate cancer (HCC) 04/02/2021   Tubular adenoma of colon     Past Surgical History:  Procedure Laterality Date   ACHILLES TENDON REPAIR  2001   BICEPS TENDON REPAIR Left 2014   COLON SURGERY  2022   Polyps/ lymphoma tumor removed   COLONOSCOPY  2012   polyps-[place]    COSMETIC SURGERY  1995   Eye lids   HEMORROIDECTOMY  2012   NASAL SINUS SURGERY     09/14/22   ROTATOR CUFF REPAIR Left 2014   TOTAL HIP ARTHROPLASTY Right 12/07/2014   Procedure: RIGHT TOTAL HIP ARTHROPLASTY ANTERIOR APPROACH;  Surgeon: Lonni CINDERELLA Poli, MD;  Location: [facility];  Service: Orthopedics;  Laterality: Right;   TOTAL HIP ARTHROPLASTY Left 05/29/2022   Procedure: LEFT TOTAL HIP ARTHROPLASTY ANTERIOR APPROACH;  Surgeon: Poli Lonni CINDERELLA, MD;  Location: [facility];  Service: Orthopedics;  Laterality: Left;    Current Medications: Current Meds  Medication Sig   Alcohol Swabs (B-D SINGLE USE SWABS  REGULAR) PADS Use to check blood sugar up to 2 times a day   aspirin  EC 81 MG tablet Take 81 mg by mouth daily. Swallow whole.   cetirizine  (ZYRTEC ) 10 MG tablet Take 10 mg by mouth daily.   CLINPRO 5000 1.1 % PSTE Place 1 Application onto teeth daily.   clobetasol  ointment (TEMOVATE ) 0.05 % Apply 1 Application topically 2 (two) times daily as needed.   FLUoxetine  (PROZAC ) 20 MG capsule TAKE 1 CAPSULE BY MOUTH EVERY DAY   fluticasone (FLONASE) 50 MCG/ACT nasal spray Place 2 sprays into both nostrils daily.   gabapentin  (NEURONTIN ) 300 MG capsule  Take 2 tablet at 4pm and 4 tablets at 9pm.   glucose blood test strip SMARTSIG:Via Meter 1-2 Times Daily   inclisiran (LEQVIO ) 284 MG/1.5ML SOSY injection Inject 284 mg into the skin every 6 (six) months.   Lancets (ONETOUCH DELICA PLUS LANCET30G) MISC USE TO CHECK BLOOD SUGAR UP TO 2 TIMES A DAY.   losartan  (COZAAR ) 25 MG tablet TAKE 1 TABLET (25 MG TOTAL) BY MOUTH DAILY.   metFORMIN  (GLUCOPHAGE -XR) 500 MG 24 hr tablet TAKE 4 TABLETS BY MOUTH DAILY WITH BREAKFAST.   Olopatadine  HCl (PATADAY ) 0.7 % SOLN Place 1 drop into both eyes daily.   pantoprazole  (PROTONIX ) 40 MG tablet Take 1 tablet (40 mg total) by mouth daily.   Polyethyl Glycol-Propyl Glycol (SYSTANE OP) Place 1 drop into both eyes as needed (dry eyes).   sildenafil  (VIAGRA ) 100 MG tablet Take 0.5-1 tablets (50-100 mg total) by mouth daily as needed for erectile dysfunction.   [DISCONTINUED] LORazepam  (ATIVAN ) 0.5 MG tablet TAKE 1 TABLET (0.5 MG TOTAL) BY MOUTH EVERY 8 (EIGHT) HOURS AS NEEDED. FOR ANXIETY     Allergies:   Patient has no known allergies.   Social History   Socioeconomic History   Marital status: Married    Spouse name: Particia   Number of children: 1   Years of education: Not on file   Highest education level: Bachelor's degree (e.g., BA, AB, BS)  Occupational History   Occupation: insurance adj retired  Tobacco Use   Smoking status: Former    Current packs/day: 0.00    Average packs/day: 1 pack/day for 20.0 years (20.0 ttl pk-yrs)    Types: Cigarettes    Start date: 01/06/1983    Quit date: 01/06/2003    Years since quitting: 20.9    Passive exposure: Past   Smokeless tobacco: Former    Types: Chew    Quit date: 01/06/2004   Tobacco comments:    Also used chewing tobacco formerly  Vaping Use   Vaping status: Never Used  Substance and Sexual Activity   Alcohol use: Not Currently    Comment: Totally quit 2005   Drug use: No   Sexual activity: Yes    Partners: Female    Birth control/protection: None     Comment: Wife had tubal ligation  Other Topics Concern   Not on file  Social History Narrative   Married, Web Designer baseball player   Avid golfer      Are you right handed or left handed? Right Handed    Are you currently employed ? 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       Wife, Particia, is a retired engineer, civil (consulting)        Social Drivers of Corporate Investment Banker Strain: Low Risk  (11/03/2023)   Overall Programmer, Applications (CARDIA)  Difficulty of Paying Living Expenses: Not hard at all  Food Insecurity: No Food Insecurity (11/03/2023)   Hunger Vital Sign    Worried About Running Out of Food in the Last Year: Never true    Ran Out of Food in the Last Year: Never true  Transportation Needs: No Transportation Needs (11/03/2023)   PRAPARE - Administrator, Civil Service (Medical): No    Lack of Transportation (Non-Medical): No  Physical Activity: Sufficiently Active (11/03/2023)   Exercise Vital Sign    Days of Exercise per Week: 5 days    Minutes of Exercise per Session: 60 min  Stress: No Stress Concern Present (11/03/2023)   [employer] of Occupational Health - Occupational Stress Questionnaire    Feeling of Stress: Not at all  Social Connections: Socially Integrated (11/03/2023)   Social Connection and Isolation Panel    Frequency of Communication with Friends and Family: More than three times a week    Frequency of Social Gatherings with Friends and Family: More than three times a week    Attends Religious Services: More than 4 times per year    Active Member of [employer] or Organizations: Yes    Attends Engineer, Structural: More than 4 times per year    Marital Status: Married     Family History: The patient's family history includes ADD / ADHD in his brother; Alcohol abuse in his brother, brother, and father; Anxiety disorder in his brother; Asthma in his sister; Breast cancer (age of onset: 72)  in his mother; Cancer in his mother; Diabetes in his mother; Hearing loss in his father; Heart attack in his father; Heart disease in his father; Hyperlipidemia in his brother, father, and sister; Hypertension in his father and mother; Obesity in his brother, mother, and sister; Stroke in his father and mother. There is no history of Colon cancer, Colon polyps, Esophageal cancer, Rectal cancer, or Stomach cancer.  ROS:   Please see the history of present illness.     All other systems reviewed and are negative.  EKGs/Labs/Other Studies Reviewed:    The following studies were reviewed today:   EKG:   11/20/21: Normal sinus rhythm, rate 69, no ST abnormalities 05/27/22: NSR, rate 66, poor R wave progression, no ST abnormalities 05/19/2023: Normal sinus rhythm, rate 67, no ST abnormalities  Recent Labs: 05/28/2023: BUN 11; Creatinine, Ser 0.81; Hemoglobin 14.0; Platelets 211; Potassium 3.8; Sodium 137 08/17/2023: ALT 72  Recent Lipid Panel    Component Value Date/Time   CHOL 136 08/17/2023 0919   CHOL 142 02/01/2020 0929   TRIG 63.0 08/17/2023 0919   HDL 48.30 08/17/2023 0919   HDL 41 02/01/2020 0929   CHOLHDL 3 08/17/2023 0919   VLDL 12.6 08/17/2023 0919   LDLCALC 75 08/17/2023 0919   LDLCALC 184 (H) 06/17/2023 1024   LDLDIRECT 141.0 10/21/2016 0833    Physical Exam:    VS:  BP 114/64   Pulse 67   Ht 5' 10 (1.778 m)   Wt 221 lb (100.2 kg)   SpO2 97%   BMI 31.71 kg/m     Wt Readings from Last 3 Encounters:  11/30/23 221 lb (100.2 kg)  11/16/23 221 lb (100.2 kg)  11/11/23 224 lb 9.6 oz (101.9 kg)     GEN:  Well nourished, well developed in no acute distress HEENT: Normal NECK: No JVD; No carotid bruits CARDIAC: RRR, no murmurs, rubs, gallops RESPIRATORY:  Clear to auscultation without rales, wheezing or rhonchi  ABDOMEN: Soft, non-tender, non-distended MUSCULOSKELETAL:  No edema; No deformity  SKIN: Warm and dry NEUROLOGIC:  Alert and oriented x 3 PSYCHIATRIC:   Normal affect   ASSESSMENT:    1. Coronary artery disease involving native coronary artery of native heart without angina pectoris   2. Essential hypertension   3. Hyperlipidemia, unspecified hyperlipidemia type   4. Obesity (BMI 30-39.9)     PLAN:     CAD: Calcium  score 155 (63rd percentile) on 10/03/2019.  He reported atypical chest pain and exertional dyspnea.  Coronary CTA was done on 01/18/2020, which showed calcium  score 165 (62nd percentile), nonobstructive CAD with mixed plaque in mid LAD causing 24 to 49% stenosis, calcified plaque in proximal LAD causing 0 to 24% stenosis, noncalcified plaque in proximal RCA causing 25 to 49% stenosis, calcified plaque in proximal and mid RCA causing 0 to 24% stenosis.  Echocardiogram on 11/03/2019 showed normal biventricular function, no significant valvular disease. - Atorvastatin  discontinued due to issues with transaminitis.  Referred to pharmacy lipid clinic, started on Leqvio   T2DM: A1c 6.9%.  On metformin   Hypertension: On losartan  25 mg daily.  Appears controlled  Hyperlipidemia: Atorvastatin  discontinued due to transaminitis.  Referred to pharmacy lipid clinic, started on Leqvio .  LDL 75 on 08/17/2023, I was after his first dose of Leqvio .  Has upcoming labs, will follow-up lipid panel  Lightheadedness: Description suggest vertigo vs orthostasis, encouraged to stay hydrated.  Orthostatics in prior clinic visit showed drop in BP with standing but not meeting criteria for orthostatic hypotension.  -Denies any recent lightheadedness  Leg pain/numbness: normal ABIs on 11/08/19  Lymphoma: Status post immunotherapy, follows with hematology, in surveillance  Unequal blood pressure in upper extremities: Reports BP often 20 points lower in right arm.  Upper extremity arterial duplex unremarkable 06/2022  Obesity: Refer to pharmacy lipid clinic to evaluate for GLP-1   RTC in 6 months  Medication Adjustments/Labs and Tests Ordered: Current  medicines are reviewed at length with the patient today.  Concerns regarding medicines are outlined above.  Orders Placed This Encounter  Procedures   AMB Referral to [facility] Pharm-D   No orders of the defined types were placed in this encounter.   Patient Instructions  Medication Instructions:  Your physician recommends that you continue on your current medications as directed. Please refer to the Current Medication list given to you today.  *If you need a refill on your cardiac medications before your next appointment, please call your pharmacy*  Lab Work: None ordered  Testing/Procedures: None ordered  Follow-Up: At [facility], you and your health needs are our priority.  As part of our continuing mission to provide you with exceptional heart care, our providers are all part of one team.  This team includes your primary Cardiologist (physician) and Advanced Practice Providers or APPs (Physician Assistants and Nurse Practitioners) who all work together to provide you with the care you need, when you need it.  You have been referred to pharmD   Your next appointment:   6 month(s)  Provider:   Lonni LITTIE Nanas, MD     Thank you for choosing [facility]!!   [phone]       Signed, Lonni LITTIE Nanas, MD  11/30/2023 8:49 AM    [facility]

## 2023-11-30 ENCOUNTER — Ambulatory Visit: Attending: Cardiology | Admitting: Cardiology

## 2023-11-30 ENCOUNTER — Encounter: Payer: Self-pay | Admitting: *Deleted

## 2023-11-30 ENCOUNTER — Encounter: Payer: Self-pay | Admitting: Cardiology

## 2023-11-30 VITALS — BP 114/64 | HR 67 | Ht 70.0 in | Wt 221.0 lb

## 2023-11-30 DIAGNOSIS — E785 Hyperlipidemia, unspecified: Secondary | ICD-10-CM | POA: Diagnosis not present

## 2023-11-30 DIAGNOSIS — I2510 Atherosclerotic heart disease of native coronary artery without angina pectoris: Secondary | ICD-10-CM

## 2023-11-30 DIAGNOSIS — I10 Essential (primary) hypertension: Secondary | ICD-10-CM | POA: Diagnosis not present

## 2023-11-30 DIAGNOSIS — E669 Obesity, unspecified: Secondary | ICD-10-CM | POA: Diagnosis not present

## 2023-11-30 NOTE — Telephone Encounter (Signed)
 Last office visit 11/04/2023 for DM.  Last refilled 06/02/2023 for #30 with 3 refills.  Next appt: No future appointments with PCP.

## 2023-11-30 NOTE — Patient Instructions (Signed)
 Medication Instructions:  Your physician recommends that you continue on your current medications as directed. Please refer to the Current Medication list given to you today.  *If you need a refill on your cardiac medications before your next appointment, please call your pharmacy*  Lab Work: None ordered  Testing/Procedures: None ordered  Follow-Up: At [facility], you and your health needs are our priority.  As part of our continuing mission to provide you with exceptional heart care, our providers are all part of one team.  This team includes your primary Cardiologist (physician) and Advanced Practice Providers or APPs (Physician Assistants and Nurse Practitioners) who all work together to provide you with the care you need, when you need it.  You have been referred to pharmD   Your next appointment:   6 month(s)  Provider:   Lonni LITTIE Nanas, MD     Thank you for choosing [facility]!!   [phone]

## 2023-12-08 ENCOUNTER — Other Ambulatory Visit (INDEPENDENT_AMBULATORY_CARE_PROVIDER_SITE_OTHER)

## 2023-12-08 ENCOUNTER — Other Ambulatory Visit: Payer: Self-pay | Admitting: Family Medicine

## 2023-12-08 ENCOUNTER — Encounter: Payer: Self-pay | Admitting: Cardiology

## 2023-12-08 ENCOUNTER — Encounter: Payer: Self-pay | Admitting: Hematology and Oncology

## 2023-12-08 DIAGNOSIS — R7989 Other specified abnormal findings of blood chemistry: Secondary | ICD-10-CM

## 2023-12-08 LAB — GAMMA GT: GGT: 31 U/L (ref 7–51)

## 2023-12-08 LAB — HEPATIC FUNCTION PANEL
ALT: 43 U/L (ref 0–53)
AST: 30 U/L (ref 0–37)
Albumin: 4.2 g/dL (ref 3.5–5.2)
Alkaline Phosphatase: 77 U/L (ref 39–117)
Bilirubin, Direct: 0.1 mg/dL (ref 0.0–0.3)
Total Bilirubin: 0.8 mg/dL (ref 0.2–1.2)
Total Protein: 7.1 g/dL (ref 6.0–8.3)

## 2023-12-10 ENCOUNTER — Ambulatory Visit: Payer: Self-pay | Admitting: Internal Medicine

## 2023-12-10 ENCOUNTER — Other Ambulatory Visit (HOSPITAL_COMMUNITY): Payer: Self-pay | Admitting: Pharmacy Technician

## 2023-12-24 NOTE — Progress Notes (Signed)
 Charles [name]                                          MRN: [id number]   12/24/2023   The [facility]BCI Quality Team Specialist reviewed this patient medical record for the purposes of chart review for care gap closure. The following were reviewed: abstraction for care gap closure-controlling blood pressure.    VBCI Quality Team

## 2024-01-02 ENCOUNTER — Other Ambulatory Visit: Payer: Self-pay | Admitting: Family Medicine

## 2024-01-04 ENCOUNTER — Other Ambulatory Visit: Payer: Self-pay | Admitting: Hematology and Oncology

## 2024-01-04 DIAGNOSIS — C8580 Other specified types of non-Hodgkin lymphoma, unspecified site: Secondary | ICD-10-CM

## 2024-01-06 ENCOUNTER — Other Ambulatory Visit: Payer: Self-pay | Admitting: Neurology

## 2024-01-18 ENCOUNTER — Other Ambulatory Visit (HOSPITAL_COMMUNITY): Payer: Self-pay

## 2024-01-18 ENCOUNTER — Telehealth: Payer: Self-pay

## 2024-01-18 ENCOUNTER — Encounter: Payer: Self-pay | Admitting: Cardiology

## 2024-01-18 ENCOUNTER — Telehealth: Payer: Self-pay | Admitting: Pharmacy Technician

## 2024-01-18 ENCOUNTER — Ambulatory Visit: Attending: Cardiology

## 2024-01-18 VITALS — Ht 70.0 in | Wt 238.4 lb

## 2024-01-18 DIAGNOSIS — E119 Type 2 diabetes mellitus without complications: Secondary | ICD-10-CM

## 2024-01-18 NOTE — Telephone Encounter (Signed)
 Please see other encounter.

## 2024-01-18 NOTE — Progress Notes (Signed)
 Patient ID: Charles [name]                 DOB: 27-Nov-1955                    MRN: [id number]     HPI: Charles [name] is a 69 y.o. male patient referred to pharmacy clinic by Dr. Kate to initiate GLP1-RA therapy. PMH is significant for HTN, T2DM, HLD, GAD, prostate cancer and obesity. Most recent BMI 34.21 kg/m. Most recent A1c is 6.9% on 10/2023.  Current weight and BMI: 238.4 lbs and 34.21 kg/m  Goal weight: 200 lbs  Current meds that affect weight: none  Patient presents to appointment today accompanied by wife to discuss GLP-1RA therapy. He said that his oncologist encouraged him to lose weight and reach out to cardiologist to discuss GLP-1 therapy. He is currently taking metformin  1000 mg twice daily for DM management. Most recent A1c is 6.9% on 10/2023, at goal. Does not report checking BG regularly.  Diet:  Patient states that his diet is generally good, high in fiber, and includes bread. Rarely eats out except for social occasions, typically choosing lean meat and Caesar salad. He enjoys sweet treats including candy, baked desserts.  Exercise:  He walks 7 miles three times per week   Family History:  Relation Problem Comments  Mother - Mom (Alive) Breast cancer (Age: 26)   Cancer   Diabetes   Hypertension   Obesity   Stroke     Father - Dad Metallurgist) Alcohol abuse   Hearing loss   Heart attack   Heart disease   Hyperlipidemia   Hypertension   Stroke     Sister - Charles [name]   Hyperlipidemia   Obesity     Brother - Charles [name]   Alcohol abuse   Anxiety disorder   Hyperlipidemia   Obesity     Brother - Charles [name] Alcohol abuse     Maternal Grandmother (Deceased)   Maternal Grandfather (Deceased)   Paternal Grandmother (Deceased)   Paternal Grandfather (Deceased)   Neg Hx Colon cancer   Colon polyps   Esophageal cancer   Rectal cancer   Stomach cancer       Social History: Alcohol: none Smoking: none   Labs: Lab Results  Component Value Date    HGBA1C 6.9 (A) 11/04/2023    Wt Readings from Last 1 Encounters:  11/30/23 221 lb (100.2 kg)    BP Readings from Last 1 Encounters:  11/30/23 114/64   Pulse Readings from Last 1 Encounters:  11/30/23 67       Component Value Date/Time   CHOL 136 08/17/2023 0919   CHOL 142 02/01/2020 0929   TRIG 63.0 08/17/2023 0919   HDL 48.30 08/17/2023 0919   HDL 41 02/01/2020 0929   CHOLHDL 3 08/17/2023 0919   VLDL 12.6 08/17/2023 0919   LDLCALC 75 08/17/2023 0919   LDLCALC 184 (H) 06/17/2023 1024   LDLDIRECT 141.0 10/21/2016 0833    Past Medical History:  Diagnosis Date   Controlled type 2 diabetes mellitus without complication, without long-term current use of insulin (HCC)    Diabetic neuropathy associated with type 2 diabetes mellitus (HCC)    Diverticulitis    Generalized anxiety disorder 10/21/2014   GERD (gastroesophageal reflux disease)    Hyperlipidemia    Hypertension 06/2021   Marginal zone B-cell lymphoma (HCC) 06/10/2020   Prostate cancer (HCC) 04/02/2021   Tubular adenoma of colon  Medications Ordered Prior to Encounter[1]  Allergies[2]   Assessment/Plan:  1. T2DM/ Weight loss - Patient has not met goal of at least 5% of body weight loss with comprehensive lifestyle modifications alone in the past 3-6 months. Pharmacotherapy is appropriate to pursue as augmentation. Most recent A1c at goal, <7%. Will start GLP-1RA . Confirmed no personal or family history of medullary thyroid carcinoma (MTC) or Multiple Endocrine Neoplasia syndrome type 2 (MEN 2). Injection technique reviewed at today's visit.  Will continue metformin  1000 mg twice daily; low risk of hypoglycemia.  Advised patient on common side effects including nausea, diarrhea, dyspepsia, decreased appetite, and fatigue. Counseled patient on reducing meal size and how to titrate medication to minimize side effects. Counseled patient to call if intolerable side effects or if experiencing dehydration,  abdominal pain, or dizziness. Along with pharmacotherapy, the patient will follow dietary modifications and aim for at least 150 minutes of moderate-intensity exercise per week, plus resistance training twice a week (as recommended by the American Heart Association). This resistance training--such as weightlifting, bodyweight exercises, or using resistance bands, adapted to the patients ability--will help prevent muscle loss. Also, discussed and provided [employer] handout.   Follow up in 1-2 days regarding coverage of GLP-RA . If therapy is initiated, phone follow-ups will be conducted every 4 weeks for dose titration until the patient reaches the effective therapeutic dose and target weight.  Abygail Galeno E. Teren Zurcher, Pharm.D, CPP [facility]. [facility] [address], [place], [place] [address] Phone: [phone]; Fax: [phone]      [1]  Current Outpatient Medications on File Prior to Visit  Medication Sig Dispense Refill   Alcohol Swabs (B-D SINGLE USE SWABS REGULAR) PADS Use to check blood sugar up to 2 times a day 100 each 5   aspirin  EC 81 MG tablet Take 81 mg by mouth daily. Swallow whole.     cetirizine  (ZYRTEC ) 10 MG tablet Take 10 mg by mouth daily.     CLINPRO 5000 1.1 % PSTE Place 1 Application onto teeth daily.     clobetasol  ointment (TEMOVATE ) 0.05 % Apply 1 Application topically 2 (two) times daily as needed.     FLUoxetine  (PROZAC ) 20 MG capsule TAKE 1 CAPSULE BY MOUTH EVERY DAY 90 capsule 1   fluticasone (FLONASE) 50 MCG/ACT nasal spray Place 2 sprays into both nostrils daily.     gabapentin  (NEURONTIN ) 300 MG capsule TAKE 2 TABLET AT 4PM AND 4 TABLETS AT 9PM. 540 capsule 3   glucose blood test strip SMARTSIG:Via Meter 1-2 Times Daily     inclisiran (LEQVIO ) 284 MG/1.5ML SOSY injection Inject 284 mg into the skin every 6 (six) months.     Lancets (ONETOUCH DELICA PLUS LANCET30G) MISC USE TO CHECK BLOOD SUGAR UP TO 2  TIMES A DAY. 100 each 5   LORazepam  (ATIVAN ) 0.5 MG tablet TAKE 1 TABLET (0.5 MG TOTAL) BY MOUTH EVERY 8 (EIGHT) HOURS AS NEEDED. FOR ANXIETY 30 tablet 3   losartan  (COZAAR ) 25 MG tablet TAKE 1 TABLET (25 MG TOTAL) BY MOUTH DAILY. 90 tablet 3   metFORMIN  (GLUCOPHAGE -XR) 500 MG 24 hr tablet TAKE 4 TABLETS BY MOUTH DAILY WITH BREAKFAST. 360 tablet 1   Olopatadine  HCl (PATADAY ) 0.7 % SOLN Place 1 drop into both eyes daily.     pantoprazole  (PROTONIX ) 40 MG tablet TAKE 1 TABLET BY MOUTH EVERY DAY 90 tablet 0   Polyethyl Glycol-Propyl Glycol (SYSTANE OP) Place 1 drop  into both eyes as needed (dry eyes).     sildenafil  (VIAGRA ) 100 MG tablet Take 0.5-1 tablets (50-100 mg total) by mouth daily as needed for erectile dysfunction. 5 tablet 11   No current facility-administered medications on file prior to visit.  [2] No Known Allergies

## 2024-01-18 NOTE — Telephone Encounter (Signed)
 Called and spoke with wife who is authorized to discuss medical info. Discuss copay amount for both Ozempic and Mounjaro  (~600/ 1 month). Discussed pt has deductible so costly price until deductible is met. Wife stated she will reach out to insurance company to get a better idea of future costs before making a decision. Encouraged to reach out to PharmD once decision is made.

## 2024-01-18 NOTE — Patient Instructions (Addendum)
 I will be working on getting coverage for GLP-1 therapy. I will reach out to you once I hear back from insurance.  Inza Mikrut E. Kierstan Auer, Pharm.D, CPP [place]. [facility] [address], [place], [place] [address] Phone: [phone]; Fax: [phone]    GLP-1 Receptor Agonist Counseling Points This medication reduces your appetite and may make you feel fuller longer.  Stop eating when your body tells you that you are full. This will likely happen sooner than you are used to. Fried/greasy food and sweets may upset your stomach - minimize these as much as possible. Store your medication in the fridge until you are ready to use it. Inject your medication in the fatty tissue of your lower abdominal area (2 inches away from belly button) or upper outer thigh. Rotate injection sites. Common side effects include: nausea, diarrhea/constipation, and heartburn, and are more likely to occur if you overeat. Stop your injection for 7 days prior to surgical procedures requiring anesthesia.  Dosing schedule:  We will touch base with you monthly over the phone. The medication can be increased in monthly intervals depending on tolerability and efficacy.  Tips for success: Write down the reasons why you want to lose weight and post it in a place where you'll see it often.  Start small and work your way up. Keep in mind that it takes time to achieve goals, and small steps add up.  Any additional movements help to burn calories. Taking the stairs rather than the elevator and parking at the far end of your parking lot are easy ways to start. Brisk walking for at least 30 minutes 4 or more days of the week is an excellent goal to work toward  Understanding what it means to feel full: Did you know that it can take 15 minutes or more for your brain to receive the message that you've eaten? That means that, if you eat less food, but consume it slower, you may still feel  satisfied.  Eating a lot of fruits and vegetables can also help you feel fuller.  Eat off of smaller plates so that moderate portions don't seem too small  Tips for living a healthier life     Building a Healthy and Balanced Diet Make most of your meal vegetables and fruits -  of your plate. Aim for color and variety, and remember that potatoes dont count as vegetables on the Healthy Eating Plate because of their negative impact on blood sugar.  Go for whole grains -  of your plate. Whole and intact grains--whole wheat, barley, wheat berries, quinoa, oats, brown rice, and foods made with them, such as whole wheat pasta--have a milder effect on blood sugar and insulin than Kyiesha Millward bread, Liboria Putnam rice, and other refined grains.  Protein power -  of your plate. Fish, poultry, beans, and nuts are all healthy, versatile protein sources--they can be mixed into salads, and pair well with vegetables on a plate. Limit red meat, and avoid processed meats such as bacon and sausage.  Healthy plant oils - in moderation. Choose healthy vegetable oils like olive, canola, soy, corn, sunflower, peanut, and others, and avoid partially hydrogenated oils, which contain unhealthy trans fats. Remember that low-fat does not mean healthy.  Drink water, coffee, or tea. Skip sugary drinks, limit milk and dairy products to one to two servings per day, and limit juice to a small glass per day.  Stay active. The red figure running  across the Healthy Eating Plates placemat is a reminder that staying active is also important in weight control.  The main message of the Healthy Eating Plate is to focus on diet quality:  The type of carbohydrate in the diet is more important than the amount of carbohydrate in the diet, because some sources of carbohydrate--like vegetables (other than potatoes), fruits, whole grains, and beans--are healthier than others. The Healthy Eating Plate also advises consumers to avoid  sugary beverages, a major source of calories--usually with little nutritional value--in the American diet. The Healthy Eating Plate encourages consumers to use healthy oils, and it does not set a maximum on the percentage of calories people should get each day from healthy sources of fat. In this way, the Healthy Eating Plate recommends the opposite of the low-fat message promoted for decades by the USDA.  cuetune.com.ee  SUGAR  Sugar is a huge problem in the modern day diet. Sugar is a big contributor to heart disease, diabetes, high triglyceride levels, fatty liver disease and obesity. Sugar is hidden in almost all packaged foods/beverages. Added sugar is extra sugar that is added beyond what is naturally found and has no nutritional benefit for your body. The American Heart Association recommends limiting added sugars to no more than 25g for women and 36 grams for men per day. There are many names for sugar including maltose, sucrose (names ending in ose), high fructose corn syrup, molasses, cane sugar, corn sweetener, raw sugar, syrup, honey or fruit juice concentrate.   One of the best ways to limit your added sugars is to stop drinking sweetened beverages such as soda, sweet tea, and fruit juice.  There is 65g of added sugars in one 20oz bottle of Coke! That is equal to 7.5 donuts.   Pay attention and read all nutrition facts labels. Below is an examples of a nutrition facts label. The #1 is showing you the total sugars where the # 2 is showing you the added sugars. This one serving has almost the max amount of added sugars per day!   EXERCISE  Exercise is good. Weve all heard that. In an ideal world, we would all have time and resources to get plenty of it. When you are active, your heart pumps more efficiently and you will feel better.  Multiple studies show that even walking regularly has benefits that include living a longer life. The  American Heart Association recommends 150 minutes per week of exercise (30 minutes per day most days of the week). You can do this in any increment you wish. Nine or more 10-minute walks count. So does an hour-long exercise class. Break the time apart into what will work in your life. Some of the best things you can do include walking briskly, jogging, cycling or swimming laps. Not everyone is ready to exercise. Sometimes we need to start with just getting active. Here are some easy ways to be more active throughout the day:  Take the stairs instead of the elevator  Go for a 10-15 minute walk during your lunch break (find a friend to make it more enjoyable)  When shopping, park at the back of the parking lot  If you take public transportation, get off one stop early and walk the extra distance  Pace around while making phone calls  Check with your doctor if you arent sure what your limitations may be. Always remember to drink plenty of water when doing any type of exercise. Dont feel like a failure if  youre not getting the 90-150 minutes per week. If you started by being a couch potato, then just a 10-minute walk each day is a huge improvement. Start with little victories and work your way up.   HEALTHY EATING TIPS              Plan ahead: make a menu of the meals for a week then create a grocery list to go with that menu. Consider meals that easily stretch into a night of leftovers, such as stews or casseroles. Or consider making two of your favorite meal and put one in the freezer for another night. Try a night or two each week that is meatless or no cook such as salads. When you get home from the grocery store wash and prepare your vegetables and fruits. Then when you need them they are ready to go.   Tips for going to the grocery store:  Buy store or generic brands  Check the weekly ad from your store on-line or in their in-store flyer  Look at the unit price on the shelf tag to  compare/contrast the costs of different items  Buy fruits/vegetables in season  Carrots, bananas and apples are low-cost, naturally healthy items  If meats or frozen vegetables are on sale, buy some extras and put in your freezer  Limit buying prepared or ready to eat items, even if they are pre-made salads or fruit snacks  Do not shop when youre hungry  Foods at eye level tend to be more expensive. Look on the high and low shelves for deals.  Consider shopping at the farmers market for fresh foods in season.  Avoid the cookie and chip aisles (these are expensive, high in calories and low in nutritional value). Shop on the outside of the grocery store.  Healthy food preparations:  If you cant get lean hamburger, be sure to drain the fat when cooking  Steam, saut (in olive oil), grill or bake foods  Experiment with different seasonings to avoid adding salt to your foods. Kosher salt, sea salt and Himalayan salt are all still salt and should be avoided. Try seasoning food with onion, garlic, thyme, rosemary, basil ect. Onion powder or garlic powder is ok. Avoid if it says salt (ie garlic salt).

## 2024-01-18 NOTE — Telephone Encounter (Signed)
 Pharmacy Patient Advocate Encounter   Received notification from Pt Calls Messages that prior authorization for mounjaro  2.5 MG is required/requested.   Insurance verification completed.   The patient is insured through [employer]   Per test claim: $617.47  one month  Patient may be eligible for a Medicare prescription Payment plan. The patient will need to reach out to their insurance company to enrol in the payment plan to spread out their payments throughout the year, If available. This test claim was processed through [facility]- copay amounts may vary at other pharmacies due to pharmacy/plan contracts, or as the patient moves through the different stages of their insurance plan.

## 2024-01-21 ENCOUNTER — Encounter: Payer: Self-pay | Admitting: Cardiology

## 2024-01-21 MED ORDER — MOUNJARO 2.5 MG/0.5ML ~~LOC~~ SOAJ: 2.5000 mg | SUBCUTANEOUS | 0 refills | Status: AC

## 2024-01-21 NOTE — Telephone Encounter (Signed)
 Called patient back and confirmed they would like to proceed with Mounjaro . Will send rx to [facility] in [place] as requested. Patient to reach back out after taking 3rd dose of Mounjaro  to assess toleration and dose titration.

## 2024-01-31 NOTE — Telephone Encounter (Signed)
 Spoke to patient, apt scheduled for April 7,2026 for next dose Leqvio .

## 2024-04-11 ENCOUNTER — Ambulatory Visit

## 2024-05-16 ENCOUNTER — Ambulatory Visit: Admitting: Neurology

## 2024-07-05 ENCOUNTER — Other Ambulatory Visit

## 2024-07-12 ENCOUNTER — Ambulatory Visit: Admitting: Urology

## 2024-11-16 ENCOUNTER — Inpatient Hospital Stay: Admitting: Hematology and Oncology
